# Patient Record
Sex: Male | Born: 1960 | Race: White | Hispanic: No | Smoking: Current every day smoker
Health system: Southern US, Community
[De-identification: ages and names within clinical notes are randomized; demographics above are authoritative.]

## PROBLEM LIST (undated history)

## (undated) ENCOUNTER — Emergency Department (HOSPITAL_COMMUNITY): Admission: EM | Discharge status: Against Medical Advice | Payer: Self-pay

## (undated) DIAGNOSIS — F1910 Other psychoactive substance abuse, uncomplicated: Secondary | ICD-10-CM

## (undated) DIAGNOSIS — I2510 Atherosclerotic heart disease of native coronary artery without angina pectoris: Secondary | ICD-10-CM

## (undated) DIAGNOSIS — R0602 Shortness of breath: Secondary | ICD-10-CM

## (undated) DIAGNOSIS — E7800 Pure hypercholesterolemia, unspecified: Secondary | ICD-10-CM

## (undated) DIAGNOSIS — Z720 Tobacco use: Secondary | ICD-10-CM

## (undated) DIAGNOSIS — K631 Perforation of intestine (nontraumatic): Principal | ICD-10-CM

## (undated) DIAGNOSIS — I10 Essential (primary) hypertension: Secondary | ICD-10-CM

## (undated) DIAGNOSIS — Z86010 Personal history of colonic polyps: Secondary | ICD-10-CM

## (undated) DIAGNOSIS — G4730 Sleep apnea, unspecified: Secondary | ICD-10-CM

## (undated) HISTORY — PX: COLON SURGERY: SHX602

## (undated) HISTORY — DX: Essential (primary) hypertension: I10

## (undated) HISTORY — DX: Tobacco use: Z72.0

## (undated) HISTORY — DX: Pure hypercholesterolemia, unspecified: E78.00

## (undated) HISTORY — DX: Other psychoactive substance abuse, uncomplicated: F19.10

## (undated) HISTORY — PX: OTHER SURGICAL HISTORY: SHX169

## (undated) HISTORY — DX: Atherosclerotic heart disease of native coronary artery without angina pectoris: I25.10

## (undated) HISTORY — DX: Perforation of intestine (nontraumatic): K63.1

## (undated) HISTORY — DX: Personal history of colonic polyps: Z86.010

---

## 1993-07-02 DIAGNOSIS — G4730 Sleep apnea, unspecified: Secondary | ICD-10-CM

## 1993-07-02 HISTORY — DX: Sleep apnea, unspecified: G47.30

## 2008-07-09 ENCOUNTER — Emergency Department (HOSPITAL_COMMUNITY): Admission: EM | Admit: 2008-07-09 | Discharge: 2008-07-09 | Payer: Self-pay | Admitting: Family Medicine

## 2008-07-30 ENCOUNTER — Emergency Department (HOSPITAL_COMMUNITY): Admission: EM | Admit: 2008-07-30 | Discharge: 2008-07-30 | Payer: Self-pay | Admitting: Family Medicine

## 2008-08-06 ENCOUNTER — Emergency Department (HOSPITAL_COMMUNITY): Admission: EM | Admit: 2008-08-06 | Discharge: 2008-08-06 | Payer: Self-pay | Admitting: Emergency Medicine

## 2009-01-12 ENCOUNTER — Ambulatory Visit: Payer: Self-pay | Admitting: Internal Medicine

## 2009-02-14 ENCOUNTER — Ambulatory Visit: Payer: Self-pay | Admitting: Family Medicine

## 2009-02-22 ENCOUNTER — Ambulatory Visit: Payer: Self-pay | Admitting: *Deleted

## 2009-02-24 ENCOUNTER — Ambulatory Visit (HOSPITAL_COMMUNITY): Admission: RE | Admit: 2009-02-24 | Discharge: 2009-02-24 | Payer: Self-pay | Admitting: Internal Medicine

## 2009-07-02 DIAGNOSIS — I2510 Atherosclerotic heart disease of native coronary artery without angina pectoris: Secondary | ICD-10-CM

## 2009-07-02 HISTORY — DX: Atherosclerotic heart disease of native coronary artery without angina pectoris: I25.10

## 2009-07-28 ENCOUNTER — Ambulatory Visit: Payer: Self-pay | Admitting: Family Medicine

## 2009-09-10 HISTORY — PX: CARDIAC CATHETERIZATION: SHX172

## 2009-09-12 ENCOUNTER — Ambulatory Visit (HOSPITAL_COMMUNITY): Admission: RE | Admit: 2009-09-12 | Discharge: 2009-09-12 | Payer: Self-pay | Admitting: Cardiology

## 2010-01-09 IMAGING — RF DG ESOPHAGUS
7 of 11 series · 12 of 24 positions shown · non-contrast
Comparison: None

CLINICAL DATA: History given of solid and liquid dysphagia

ESOPHOGRAM / BARIUM SWALLOW / BARIUM TABLET STUDY
TECHNIQUE: Combined double contrast and single contrast
examination performed using effervescent crystals, thick barium
liquid, and thin barium liquid.  The patient was observed with
fluoroscopy swallowing a 13mm barium sulphate tablet.
Fluoroscopy time:  0.9 minutes.

[Series 1: run · 3 of 9 slices shown (1 of 7)]
[im 2/9]
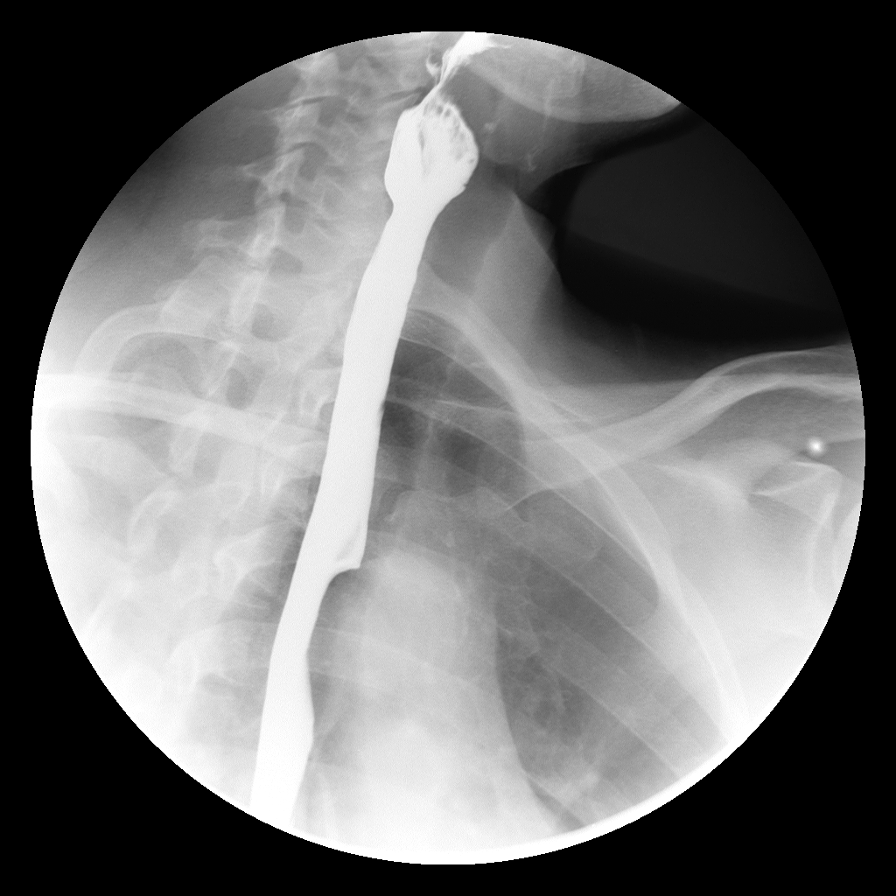
[im 5/9]
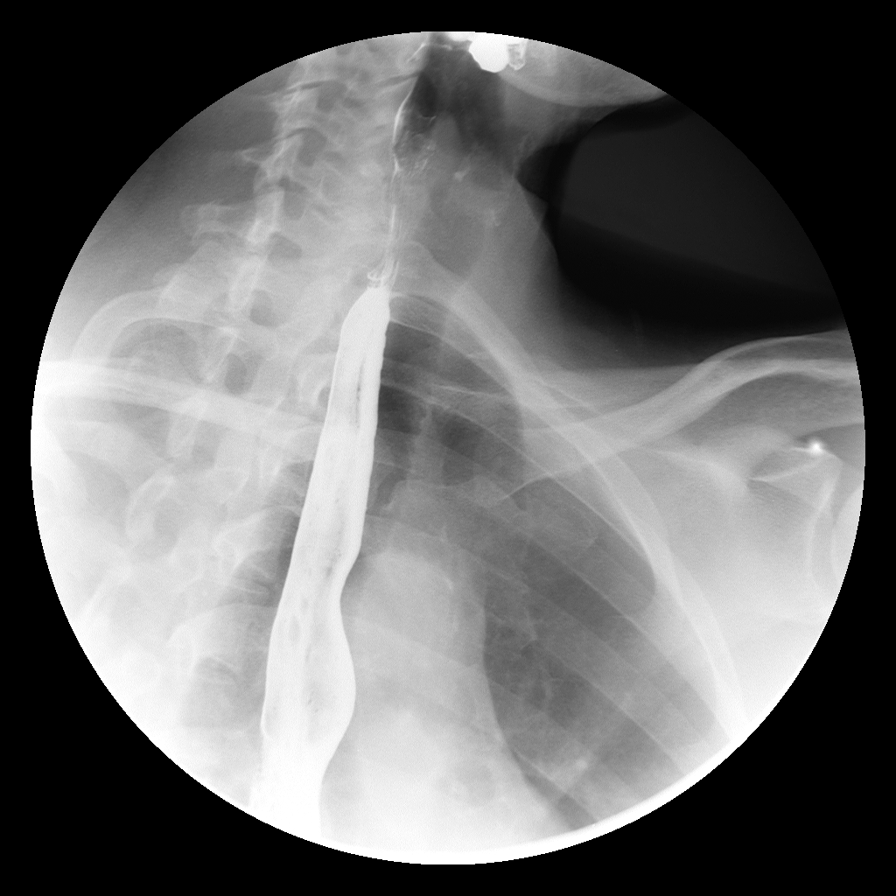
[im 7/9]
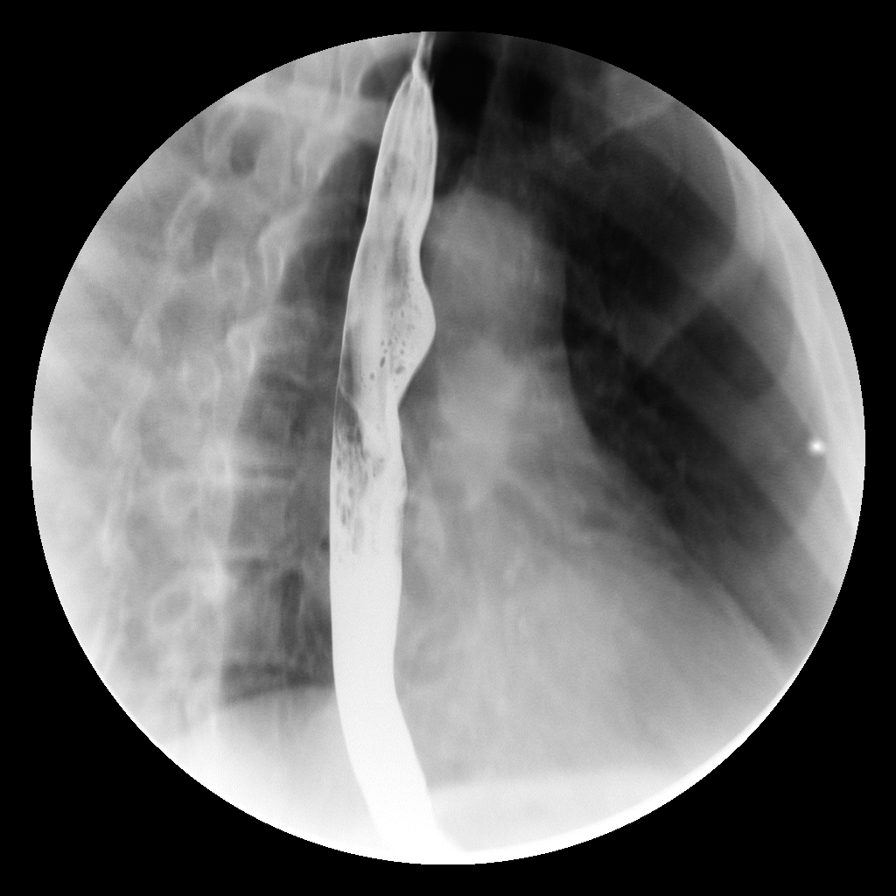

[Series 2: run · 2 of 4 slices shown (2 of 7)]
[im 1/4]
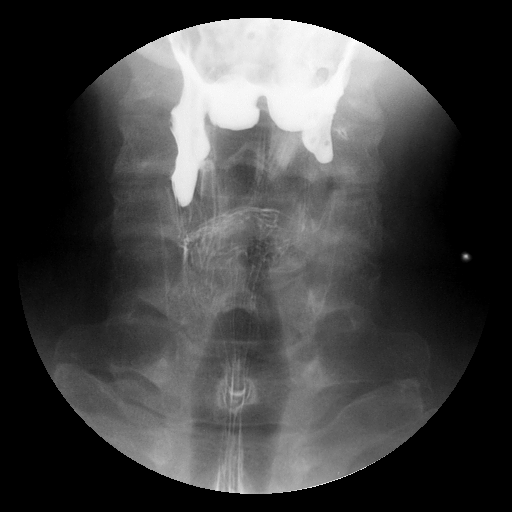
[im 3/4]
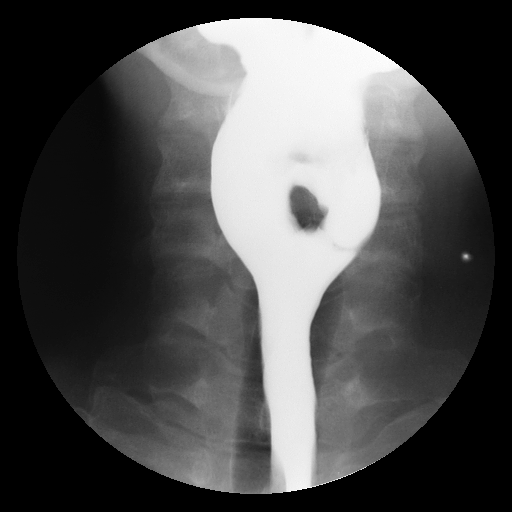

[Series 3: run · 3 of 5 slices shown (3 of 7)]
[im 1/5]
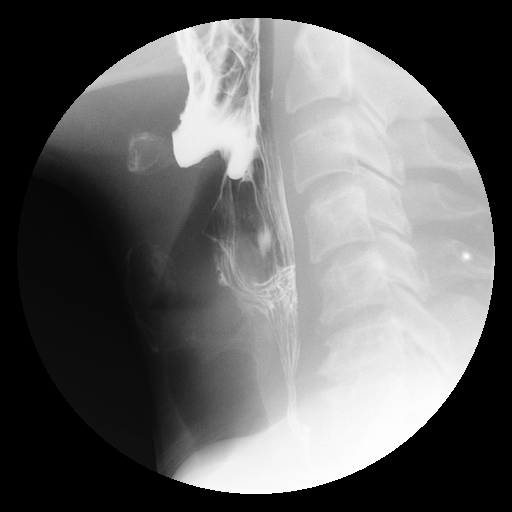
[im 3/5]
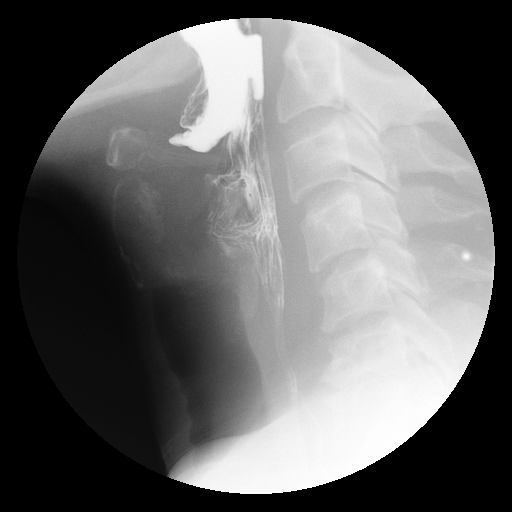
[im 5/5]
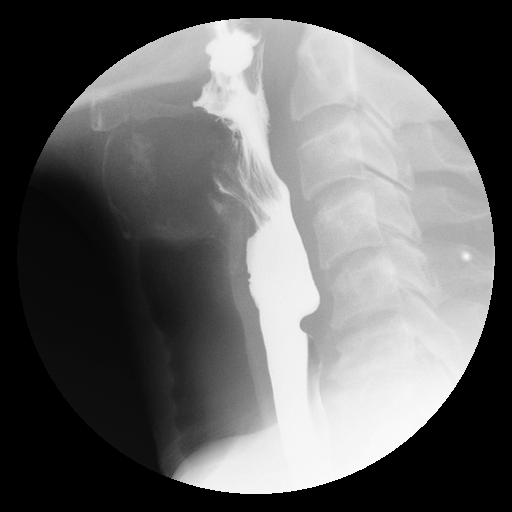

[Series 5: run · 1 of 1 slices shown (4 of 7)]
[im 1/1]
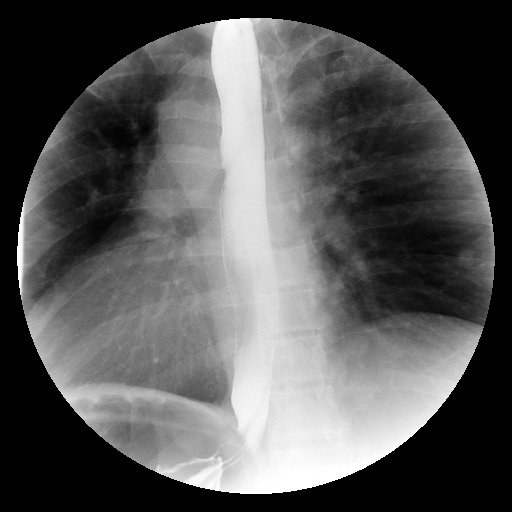

[Series 7: run · 1 of 1 slices shown (5 of 7)]
[im 1/1]
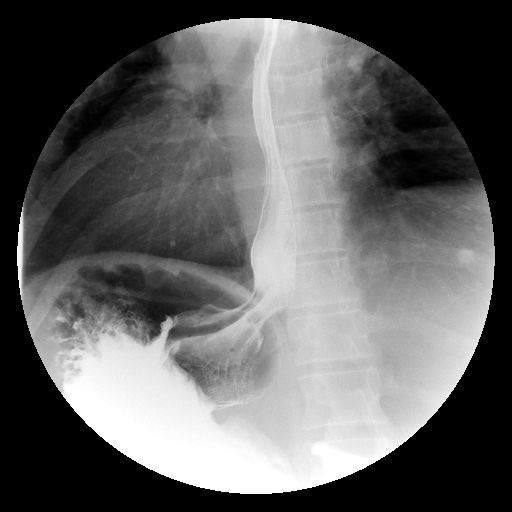

[Series 9: run · 1 of 1 slices shown (6 of 7)]
[im 1/1]
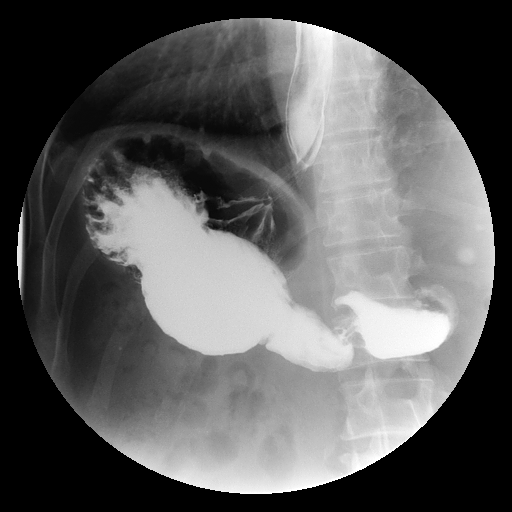

[Series 11: run · 1 of 1 slices shown (7 of 7)]
[im 1/1]
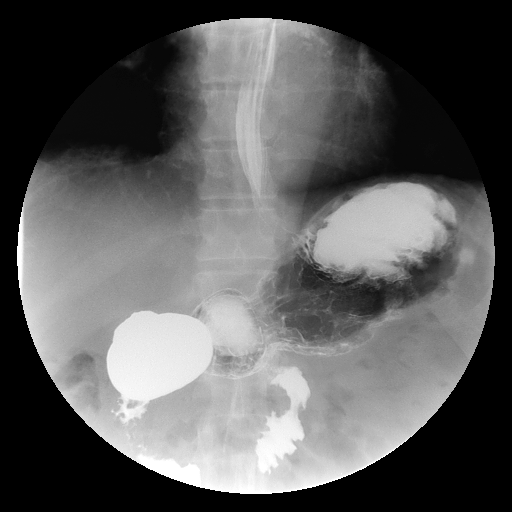

[12 of 24 positions shown; findings below may reference images not displayed]

FINDINGS: The the patient had no difficulty initiating swallowing.
No aspiration occurred.  Hypopharynx appeared normal.

There is a mildly prominent cricopharyngeus muscle impression.  No
Wafeight diverticulum is evident.

No esophageal mass, obstruction, hiatal hernia, or mucosal changes
of esophagitis were seen.  Peristalsis appeared normal.

  In the recumbent position a small amount of spontaneous
gastroesophageal reflux occurred to the level of the aortic arch
with clearing by subsequent peristalsis.

When the patient swallowed the 13 mm barium sulfate tablet, it
passed through the esophagus into the stomach with no evidence of
stricture or narrowing.
IMPRESSION: With swallowing there is a prominent cricopharyngeus muscle
impression.  This can give the sensation of a lump in throat or
dysphagia in some patients.  No Wafeight diverticulum is evident.

There was gastroesophageal reflux seen in the recumbent position.
No stricture or mucosal changes of esophagitis were evident.  No
solid mass or obstruction was evident.

Barium tablet study is normal.

## 2010-02-15 ENCOUNTER — Emergency Department (HOSPITAL_COMMUNITY): Admission: EM | Admit: 2010-02-15 | Discharge: 2010-02-15 | Payer: Self-pay | Admitting: Emergency Medicine

## 2010-04-01 HISTORY — PX: FOREIGN BODY REMOVAL RECTAL: SHX5275

## 2010-04-17 ENCOUNTER — Observation Stay (HOSPITAL_COMMUNITY): Admission: EM | Admit: 2010-04-17 | Discharge: 2010-04-18 | Payer: Self-pay | Admitting: Emergency Medicine

## 2010-04-17 ENCOUNTER — Emergency Department (HOSPITAL_COMMUNITY): Admission: EM | Admit: 2010-04-17 | Discharge: 2010-04-17 | Payer: Self-pay | Admitting: Family Medicine

## 2010-04-18 ENCOUNTER — Encounter (INDEPENDENT_AMBULATORY_CARE_PROVIDER_SITE_OTHER): Payer: Self-pay | Admitting: General Surgery

## 2010-07-24 ENCOUNTER — Encounter: Payer: Self-pay | Admitting: Family Medicine

## 2010-09-13 LAB — BASIC METABOLIC PANEL
BUN: 11 mg/dL (ref 6–23)
Chloride: 106 mEq/L (ref 96–112)
Chloride: 108 mEq/L (ref 96–112)
GFR calc Af Amer: >60 mL/min (ref >60)
GFR calc non Af Amer: >60 mL/min (ref >60)
GFR calc non Af Amer: >60 mL/min (ref >60)
Glucose, Bld: 108 mg/dL — ABNORMAL HIGH (ref 70–99)
Potassium: 3.9 mEq/L (ref 3.5–5.1)
Potassium: 4.4 mEq/L (ref 3.5–5.1)
Sodium: 142 mEq/L (ref 135–145)

## 2010-09-13 LAB — CBC
HCT: 38.7 % — ABNORMAL LOW (ref 39.0–52.0)
HCT: 40.6 % (ref 39.0–52.0)
Hemoglobin: 12.7 g/dL — ABNORMAL LOW (ref 13.0–17.0)
MCV: 87.7 fL (ref 78.0–100.0)
MCV: 88 fL (ref 78.0–100.0)
Platelets: 233 10*3/uL (ref 150–400)
RBC: 4.63 MIL/uL (ref 4.22–5.81)
RDW: 14.4 % (ref 11.5–15.5)
RDW: 14.5 % (ref 11.5–15.5)
WBC: 6.3 10*3/uL (ref 4.0–10.5)
WBC: 6.5 10*3/uL (ref 4.0–10.5)

## 2010-10-16 LAB — POCT I-STAT, CHEM 8
BUN: 18 mg/dL (ref 6–23)
Chloride: 101 mEq/L (ref 96–112)
Creatinine, Ser: 1 mg/dL (ref 0.4–1.5)
Potassium: 4.7 mEq/L (ref 3.5–5.1)
Sodium: 139 mEq/L (ref 135–145)

## 2010-10-16 LAB — FUNGUS CULTURE W SMEAR

## 2010-10-16 LAB — STREP A DNA PROBE: Group A Strep Probe: NEGATIVE

## 2010-10-16 LAB — POCT RAPID STREP A (OFFICE): Streptococcus, Group A Screen (Direct): NEGATIVE

## 2010-10-16 LAB — RPR: RPR Ser Ql: NONREACTIVE

## 2010-12-14 ENCOUNTER — Other Ambulatory Visit (INDEPENDENT_AMBULATORY_CARE_PROVIDER_SITE_OTHER): Payer: Self-pay | Admitting: Surgery

## 2010-12-14 ENCOUNTER — Inpatient Hospital Stay (HOSPITAL_COMMUNITY)
Admission: EM | Admit: 2010-12-14 | Discharge: 2010-12-20 | DRG: 329 | Disposition: A | Discharge status: Home / Self Care | Payer: Self-pay | Attending: Surgery | Admitting: Surgery

## 2010-12-14 ENCOUNTER — Emergency Department (HOSPITAL_COMMUNITY): Payer: Self-pay

## 2010-12-14 DIAGNOSIS — I10 Essential (primary) hypertension: Secondary | ICD-10-CM | POA: Diagnosis present

## 2010-12-14 DIAGNOSIS — Z880 Allergy status to penicillin: Secondary | ICD-10-CM

## 2010-12-14 DIAGNOSIS — K668 Other specified disorders of peritoneum: Secondary | ICD-10-CM | POA: Diagnosis present

## 2010-12-14 DIAGNOSIS — Q430 Meckel's diverticulum (displaced) (hypertrophic): Secondary | ICD-10-CM

## 2010-12-14 DIAGNOSIS — I2510 Atherosclerotic heart disease of native coronary artery without angina pectoris: Secondary | ICD-10-CM | POA: Diagnosis present

## 2010-12-14 DIAGNOSIS — K659 Peritonitis, unspecified: Secondary | ICD-10-CM | POA: Diagnosis present

## 2010-12-14 DIAGNOSIS — X58XXXA Exposure to other specified factors, initial encounter: Secondary | ICD-10-CM | POA: Diagnosis present

## 2010-12-14 DIAGNOSIS — S3660XA Unspecified injury of rectum, initial encounter: Principal | ICD-10-CM | POA: Diagnosis present

## 2010-12-14 LAB — URINE MICROSCOPIC-ADD ON

## 2010-12-14 LAB — COMPREHENSIVE METABOLIC PANEL
ALT: 25 U/L (ref 0–53)
Alkaline Phosphatase: 60 U/L (ref 39–117)
BUN: 33 mg/dL — ABNORMAL HIGH (ref 6–23)
CO2: 23 mEq/L (ref 19–32)
Calcium: 9.1 mg/dL (ref 8.4–10.5)
GFR calc Af Amer: >60 mL/min (ref >60)
GFR calc non Af Amer: >60 mL/min (ref >60)
Glucose, Bld: 118 mg/dL — ABNORMAL HIGH (ref 70–99)
Sodium: 139 mEq/L (ref 135–145)

## 2010-12-14 LAB — DIFFERENTIAL
Basophils Absolute: 0 10*3/uL (ref 0.0–0.1)
Eosinophils Relative: 0 % (ref 0–5)
Lymphocytes Relative: 15 % (ref 12–46)
Lymphs Abs: 0.6 10*3/uL — ABNORMAL LOW (ref 0.7–4.0)
Monocytes Absolute: 0.3 10*3/uL (ref 0.1–1.0)
Monocytes Relative: 7 % (ref 3–12)
Neutro Abs: 3.3 10*3/uL (ref 1.7–7.7)

## 2010-12-14 LAB — CBC
HCT: 41.3 % (ref 39.0–52.0)
Hemoglobin: 14.1 g/dL (ref 13.0–17.0)
MCHC: 34.1 g/dL (ref 30.0–36.0)
MCV: 84.5 fL (ref 78.0–100.0)

## 2010-12-14 LAB — URINALYSIS, ROUTINE W REFLEX MICROSCOPIC
Bilirubin Urine: NEGATIVE
Protein, ur: NEGATIVE mg/dL
Urobilinogen, UA: 1 mg/dL (ref 0.0–1.0)

## 2010-12-14 LAB — ABO/RH: ABO/RH(D): A NEG

## 2010-12-14 LAB — PROTIME-INR: Prothrombin Time: 13.7 seconds (ref 11.6–15.2)

## 2010-12-14 LAB — LIPASE, BLOOD: Lipase: 25 U/L (ref 11–59)

## 2010-12-15 ENCOUNTER — Inpatient Hospital Stay (HOSPITAL_COMMUNITY): Payer: Self-pay

## 2010-12-15 LAB — BASIC METABOLIC PANEL
BUN: 22 mg/dL (ref 6–23)
CO2: 27 mEq/L (ref 19–32)
Chloride: 108 mEq/L (ref 96–112)
Creatinine, Ser: 0.73 mg/dL (ref 0.50–1.35)
GFR calc Af Amer: >60 mL/min (ref >60)
Potassium: 3.9 mEq/L (ref 3.5–5.1)

## 2010-12-15 LAB — CBC
HCT: 34.5 % — ABNORMAL LOW (ref 39.0–52.0)
Hemoglobin: 11.4 g/dL — ABNORMAL LOW (ref 13.0–17.0)
MCV: 85 fL (ref 78.0–100.0)
RBC: 4.06 MIL/uL — ABNORMAL LOW (ref 4.22–5.81)
WBC: 8.8 10*3/uL (ref 4.0–10.5)

## 2010-12-16 LAB — CBC
HCT: 34.7 % — ABNORMAL LOW (ref 39.0–52.0)
Hemoglobin: 11.5 g/dL — ABNORMAL LOW (ref 13.0–17.0)
MCHC: 33.1 g/dL (ref 30.0–36.0)
MCV: 85.7 fL (ref 78.0–100.0)
RDW: 15.3 % (ref 11.5–15.5)

## 2010-12-16 LAB — BASIC METABOLIC PANEL
BUN: 22 mg/dL (ref 6–23)
Chloride: 106 mEq/L (ref 96–112)
Creatinine, Ser: 0.82 mg/dL (ref 0.50–1.35)
GFR calc Af Amer: >60 mL/min (ref >60)
GFR calc non Af Amer: >60 mL/min (ref >60)
Glucose, Bld: 95 mg/dL (ref 70–99)
Potassium: 3.9 mEq/L (ref 3.5–5.1)

## 2010-12-17 LAB — PHOSPHORUS: Phosphorus: 1.4 mg/dL — ABNORMAL LOW (ref 2.3–4.6)

## 2010-12-17 LAB — BASIC METABOLIC PANEL
BUN: 18 mg/dL (ref 6–23)
CO2: 25 mEq/L (ref 19–32)
Glucose, Bld: 98 mg/dL (ref 70–99)
Potassium: 3.5 mEq/L (ref 3.5–5.1)
Sodium: 139 mEq/L (ref 135–145)

## 2010-12-18 LAB — PHOSPHORUS: Phosphorus: 2.8 mg/dL (ref 2.3–4.6)

## 2010-12-18 LAB — BASIC METABOLIC PANEL
CO2: 25 mEq/L (ref 19–32)
Chloride: 105 mEq/L (ref 96–112)
Glucose, Bld: 93 mg/dL (ref 70–99)
Sodium: 138 mEq/L (ref 135–145)

## 2010-12-18 LAB — MAGNESIUM: Magnesium: 1.9 mg/dL (ref 1.5–2.5)

## 2010-12-21 NOTE — Discharge Summary (Signed)
NAMECLAYBORNE, DIVIS                 ACCOUNT NO.:  [id number]  MEDICAL RECORD NO.:  [id number]  LOCATION:  5151                         FACILITY:  [facility]  PHYSICIAN:  Velora Heckler, MD      DATE OF BIRTH:  11-12-1960  DATE OF ADMISSION:  12/14/2010 DATE OF DISCHARGE:  12/20/2010                              DISCHARGE SUMMARY   DISCHARGING PHYSICIAN:  Velora Heckler, MD  CONSULTANTS:  None.  PROCEDURES:  Exploratory laparoscopy with low anterior resection and diverting loop ileostomy and placement of an On-Q pain pump by Dr. Michaell Cowing on December 14, 2010.  REASON FOR ADMISSION:  Mr. Trusty is a 50 year old male who was in the shower around 3 a.m. prior to presenting to the emergency department. He states he developed a sudden onset of abdominal pain that was initially generalized and periumbilical, but has migrated predominantly on the right side of his abdomen.  He denies any nausea or vomiting and has had no diarrhea.  He states that the pain has persisted and gotten worse.  He came to the emergency department this morning for further evaluation.  Upon arrival to the emergency department, he had abdominal films which showed evidence of pneumoperitoneum.  At this time, we were asked to evaluate the patient for surgical admission.  Please see admitting history and physical for further details.  ADMITTING DIAGNOSES: 1. Pneumoperitoneum of unknown origin. 2. Hypertension.  HOSPITAL COURSE:  At this time, the patient was admitted.  He was taken to the operating room where he was found to have a perforated rectum. At this time, he underwent a low anterior resection with a diverting loop ileostomy and placement of an On-Q pain pump.  The patient tolerated the procedure well.  He did have an NG tube postoperatively, however, would not flush on postoperative day #1, and therefore it was clamped.  On postoperative day #2, the patient had very minimal NG tube residual and this was discontinued.   Over the next several days, the patient did not have any further nausea and vomiting with his NG tube clamped and his diet was slowly advanced as tolerated.  He then began having liquid output from his loop ileostomy.  By postoperative day #6, the patient was tolerating a regular diet and having normal liquid output from his loop ileostomy.  His abdomen was otherwise soft and only mildly tender.  His incisions were clean, dry, and intact.  His On-Q pain pump was discontinued prior to discharge as it was empty.  The patient has been evaluated by the wound ostomy care nurse in the hospital and he has been set up for an indigent program to help supply him with his ostomy needs.  Home Health nursing has also been set up to help him with ostomy care while at his [facility]. At this time, the patient is otherwise stable for discharge home.  DISCHARGE DIAGNOSES: 1. Perforated rectum status post diagnostic laparoscopy with a low     anterior resection and loop ileostomy. 2. Hypertension.  DISCHARGE MEDICATIONS:  Please see medication reconciliation form.  DISCHARGE INSTRUCTIONS:  The patient may increase his activity slowly and walk up steps.  He may shower, however, he is not to bathe for at least the next 2 weeks.  He may not drive for the next 1-2 weeks.  He is not to do any heavy lifting for the next 4-6 weeks greater than 15 or 20 pounds.  He has no dietary restrictions.  He is to return to see Dr. Michaell Cowing in our office in 2 weeks.  He is to call for fever greater than 101.5 or worsening abdominal pain.     Letha Cape, PA   ______________________________ Velora Heckler, MD    KEO/MEDQ  D:  12/20/2010  T:  12/20/2010  Job:  [id number]  cc:   Ardeth Sportsman, MD  Electronically Signed by Barnetta Chapel PA on 12/21/2010 01:25:31 PM Electronically Signed by Darnell Level MD on 12/21/2010 03:30:03 PM

## 2010-12-28 NOTE — H&P (Signed)
NAMECHEVY, SWEIGERT NO.:  [id number]  MEDICAL RECORD NO.:  [id number]  LOCATION:  5151                         FACILITY:  [facility]  PHYSICIAN:  Ardeth Sportsman, MD     DATE OF BIRTH:  01/05/1961  DATE OF ADMISSION:  12/14/2010 DATE OF DISCHARGE:                             HISTORY & PHYSICAL   PRIMARY CARE PHYSICIAN:  Unassigned.  CHIEF COMPLAINT:  Abdominal pain.  HISTORY OF PRESENT ILLNESS:  Scott Berry is a 50 year old gentleman who was in the shower last evening about 3 a.m. or early this morning when he developed sudden onset of abdominal pain.  This was sorted generalized in the periumbilical region, but has since migrated predominantly on the right side of his abdomen.  He has had no nausea, vomiting associated. He has had no diarrhea.  The patient states that he has generally constipated and did have a bowel movement yesterday that was normal appearing.  He otherwise denies hematemesis, melena, or hematochezia. He states that the pain has persisted and gotten actually worse, therefore he was brought to the emergency department this morning.  He denies any associated chest pain, shortness of breath, or wheezing. Denies any dysuria, hematuria.  Denies any trauma to his abdomen.  He was previously admitted and had to have a foreign body removed from his rectum several months ago.  The patient denies any recent ingestions or insertions of foreign bodies.  Upon evaluation in the emergency department, some plain films now show evidence of pneumoperitoneum on to the right diaphragm.  CT scan has been ordered, however, Surgical consult is requested.  PAST MEDICAL HISTORY:  Hypertension/coronary artery disease.  SURGICAL HISTORY:  Removal of rectal foreign body in October 2011.  FAMILY HISTORY:  Noncontributory at present case.  SOCIAL HISTORY:  The patient lives in a rehab facility at [facility]. 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  He is otherwise not married.  MEDICATIONS:  Include isosorbide, dose unknown.  ALLERGIES:  Include PENICILLIN which the patient states causes shortness of breath.  REVIEW OF SYSTEMS:  Please see history of present illness for pertinent findings, otherwise complete 12 system review negative.  PHYSICAL EXAMINATION:  GENERAL:  Reveals a 50 year old thin Caucasian gentleman, who is in moderate-to-severe distress, but nontoxic appearing. VITAL SIGNS:  Temperature of 97.8, heart rate currently of 88, blood pressure 101/56, respiratory rate of 20, oxygen saturation 96% on room air. HEENT:  Unremarkable. NECK:  Supple without lymphadenopathy.  Trachea is midline.  No thyromegaly or masses. LUNGS:  Clear to auscultation.  No wheezes, rhonchi, or rales.  Normal respiratory effort without use of accessory muscles. HEART:  Regular rate and rhythm.  No murmurs, gallops, or rubs. Carotids are 2+ and brisk without bruits.  Peripheral pulses intact and symmetrical. ABDOMEN:  Rigid and generally tender, predominately in the right side greater than the left.  Positive evidence of peritonitis.  No mass effect is appreciated.  Bowel sounds are hypoactive. RECTAL:  Deferred at the present time. GENITOURINARY:  Within normal limits.  Testes are bilaterally descended. No external lesions are seen. EXTREMITIES:  Good  active range of motion in all extremities without crepitus or pain.  Normal muscle strength and tone without atrophy. SKIN:  Otherwise warm and dry with good turgor.  No rashes, lesions, or nodules. NEUROLOGIC:  The patient is alert and oriented x3.  No obvious deficits noted.  DIAGNOSTICS:  CBC shows a white blood cell count of 4.2, hemoglobin of 14.1, hematocrit of 41.3, platelet count 218.  Metabolic panel shows sodium of 139, potassium of 4.1, chloride of 105, CO2 of 23, BUN of 33, creatinine of 1.2, glucose of 118.  IMAGING:  Plain films of  the abdomen show pneumoperitoneum, localizing in the right hemidiaphragm, dilated loops of small bowel, consistent with at least partial bowel obstruction versus ileus.  IMPRESSION: 1. Pneumoperitoneum likely secondary to perforated peptic ulcer given     the patient's history of NSAID and aspirin use versus     diverticulitis versus other. 2. Hypertension - stable.  PLAN:  We will admit the patient.  I had discussed with him the need for urgent surgery to find and repair whenever has caused a pneumoperitoneum.  The patient understands and will agree to said procedure and admission.     Brayton El, PA-C   ______________________________ Ardeth Sportsman, MD    KB/MEDQ  D:  12/14/2010  T:  12/15/2010  Job:  [id number]  Electronically Signed by Brayton El  on 12/25/2010 02:45:12 PM Electronically Signed by Karie Soda MD on 12/28/2010 07:16:27 AM

## 2010-12-28 NOTE — Op Note (Signed)
NAMEDEWARREN, LEDBETTER NO.:  [id number]  MEDICAL RECORD NO.:  [id number]  LOCATION:  5151                         FACILITY:  [facility]  PHYSICIAN:  Ardeth Sportsman, MD     DATE OF BIRTH:  [birth year]  DATE OF PROCEDURE:  12/14/2010 DATE OF DISCHARGE:                              OPERATIVE REPORT   PRIMARY CARE PHYSICIAN:  Unknown.  SURGEON:  Ardeth Sportsman, MD  ASSISTANT:  Gabrielle Dare. Janee Morn, MD and Brayton El, PA-C.  PREOPERATIVE DIAGNOSIS:  Pneumoperitoneum with acute abdomen.  POSTOPERATIVE DIAGNOSES: 1. Pneumoperitoneum with acute abdomen. 2. Anterior rectal wall perforation most likely question traumatic. 3. He has a Meckel's diverticulum (short, soft).  ANESTHESIA: 1. General anesthesia. 2. Local anesthetic and a field block on all port sites. 3. On-Q continuous bupivacaine pain pump.  PROCEDURE PERFORMED: 1. Diagnostic laparoscopy. 2. Laparoscopically-assisted low anterior resection with staple     anastomosis and proximal diverting loop ileostomy. 3. Rigid proctoscopy.  SPECIMENS:  Rectosigmoid, open end is proximal, anastomotic rings, blue stitches proximal.  DRAINS:  None.  ESTIMATED BLOOD LOSS:  100 mL.  OPERATIVE FINDINGS:  The proximal limb of the loop ileostomy is cephalad and the distal limb is inferior.  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  He had sudden pain in the shower at 3 o'clock this morning.  He does have a history of placing foreign bodies into his rectum and required operative removal in November 2011.  He denied such behavior at this time.  He came in with severe pain with acute abdomen.  X-ray films showed free air.  Based on his rigid abdomen and tenderness and free air, I recommended a bowel exploration.  He is not severely septic but uncomfortable.  He is not in severe shock.  Differential diagnosis discussed.  Options discussed.  Recommendations were made to  start with diagnostic laparoscopy with possible laparotomy.  Probable repair of perforated ulcer versus colon such as diverticulitis were discussed. Possibility of ostomy were discussed.  Risks, benefits, and alternatives discussed.  Questions answered and he agreed to proceed.  OPERATIVE FINDINGS:  He had some mild early peritonitis primarily infraumbilically and down into the pelvis.  He had a perforation in the anterior proximal rectum.  It was a vertical slit.  There were some ecchymosis associated with it.  There was no evidence of any ischemia. There was no evidence of any diverticulitis.  I cannot notice any obvious mucosal aberrations.  It is suspicious for a transient or rectal trauma.  He has a small Meckel's diverticulum that is rather flat and shallow in his proximal ileum.  His appendix was normal.  His small bowel was otherwise normal.  His colon was constipated with stool to the proximal descending colon but his rectosigmoid was clear without any stool within it arguing against a stomal ulcer.  There was no evidence of any feculent contamination.  His gallbladder was mildly thickened and inflamed consistent with secondary peritonitis of the liver and stomach, otherwise looked normal.  DESCRIPTION OF PROCEDURE PERFORMED:  Informed consent was confirmed. The patient received IV Invanz given his PENICILLIN allergy.  He had received Toradol in the ER.  His support group frankly refused him to get narcotics until he was going to the OR and then they acquiesced.  He underwent anesthesia without any difficulty.  He was positioned supine with arms tucked.  His abdomen was clipped, prepped, and draped in sterile fashion.  Surgical time-out confirmed our plan.  He had a Foley catheter sterilely placed.  I placed a #5-mm port in the left upper quadrant using optical entry technique with the patient in steep reverse Trendelenburg and left side up.  The entry was clean.  Camera  inspection revealed evidence of some mild peritoneal irritation infraumbilically.  I placed 5-mm ports at the umbilicus and then the left mid abdomen.  I turned attention to the duodenal sweep given his recent history of aspirin, NSAIDs, and denial of using any rectal probing.  I could see the stomach looked normal and the duodenal bulb looked normal as well. There was no phlegmon or abscess or abnormalities.  His gallbladder was mildly thickened but no evidence of any strong cholecystitis.  His liver otherwise looked normal.  His duodenal sweep felt soft and normal.  He had no evidence of inflammation in the mesentery or transverse colon.  I turned attention down towards the left lower quadrant and his sigmoid colon looked soft and normal and not particularly inflamed.  I did pull up to the rectum and I did not see any obvious feculent contamination. There was a little murky fluid down in the pelvis but no strong evidence of any diverticulitis.  I proceeded to run the small bowel.  I could find the cecum and a normal- appearing appendix.  I found the terminal ileum.  I ran the small bowel. In the proximal ileum, I found a mild antimesenteric outpouching about 1 x 2 cm consistent with probably a small Meckel's diverticulum.  He had a small dot of fat on it about 5 mm in size.  It felt soft and not inflamed or irritated.  I ran the small bowel and the rest of small bowel and the ligament of Treitz seemed normal.  Cecum all the way to the hepatic flexure, transverse colon, splenic flexure, and descending colon was normal except for some stool in the mid transverse colon to proximal descending colon.  Again the stomach looked normal.  I turned attention down in the pelvis and the area seemed to be the area of most inflammation.  Followed the rectosigmoid and eviscerated the rectosigmoid and when I got down to the rectum near the peritoneal reflection, I saw an anterior midline bruising.   In exploring that, there was an obvious slit perforation.  The rectum was clear and clean and there was one tiny stool ball down in the pelvic cul-de-sac but otherwise no major feculent contamination.  I called over Dr. Janee Morn and he looked at it and agreed.  This seems suspicious with rectal trauma.  Therefore, we decided to proceed with resection.  He did have some inflammation of his mesentery and the mucosa was somewhat heaped up around there but I could not see a definite polyp or mucosal ulcer.  Based on that, I felt I should do a segmental resection.  I mobilized the rectosigmoid by first in lateral medial fashion mobilizing from the mid descending colon down to help free the sigmoid off the gonadal vessels.  I could see the left ureter.  It was somewhat slightly larger than average but had normal peristalsis, otherwise looked healthy.  I followed back down to the peritoneal sweep on the left side.  I came down and found the sigmoid mesentery and elevated that.  I scored that from ligament of Treitz down to the peritoneal collection on the right side.  I was able to get into the retromesenteric plane and meet up with the start of my lateral mobilization.  I further elevated up. He had a somewhat enlarged parasympathetic/sympathetic nerves in the classic Y pattern going down over the sacral prominence where I was able to free that off and inflamed mesorectum.  I came into the mesorectum and did mesorectal excision just down to the mid rectum.  With that I came around and we could get to the mid rectum which was distal to the rectal perforation which was in the proximal anterior rectum.  I isolated the inferior mesenteric artery and inferior mesenteric vein near ligament of Treitz and isolated, skeletonized, and then ligated them with clips.  I tried to preserve the left colic artery at its takeoff and do a more, not a super high ligation since my suspicion for cancer was  low.  I placed 2-3 clips on the proximal side and then 1 on the distal side and then transected using Enseal bipolar cautery system. I verified good mobilization.  I further mobilized up to the splenic flexure but I did not have to do a complete formal splenic flexure mobilization.  I placed a GelPort through a 6-cm Pfannenstiel incision as an extraction site.  I kept my fingers for feeling and felt the inflammation of the mesorectum, but otherwise normal.  I skeletonized the mesorectum and transected that off at the mid rectum.  I used a 60-cm clips, laparoscopic Ethicon stapler to transect the rectum.  I transected about 90% of it but I had to do one more firing just to get the other corner as his rectum was rather dilated.  With that I could eviscerate and get his rectosigmoid out and his descending colon well.  I milked the stool balls from the splenic flexure down into the sigmoid to have most of the stool balls out, most of the stool balls not remaining in his colon.  We chose the site that could reach quite well with the good feeding vessel and transected at the descending/sigmoid junction.  It reached down well over the pubic rim.  I took the mesentery radially using the bipolar system.  I transected the rectum at the descending/sigmoid colon and sent the specimen off.  I did dilation and had good healthy mucosal bleeding with healthy mucosa and seromuscular layers.  A 29 dilator was easily felt. I placed an anvil of 33 EEA stapler in the proximal end and closed that using 0 Prolene pursestring.  I returned down to the abdomen.  Small bowel was dominant in the pelvis. I did reexplore it laparoscopically.  There were some old blood in the pelvis that I irrigated and washed out.  There was a small retroperitoneal oozer on the right side near where I had taken the mesentery, isolated, elevated that, and controlled with bipolar cautery well staying away from the ureters and  gonadal vessels.  I did copious irrigation with clear return.  Dr. Janee Morn scrubbed down and did rigid proctoscopy.  The rectal stump was about 13 cm remaining.  He found no other foreign body, no other ulcerations, or other abnormalities.  No tumors or masses.  He dilated up, I was able to pass the stapler up.  He brought  the spike out just a millimeter above the staple line.  I attached the anvil on to the spike of the stapler.  He brought that down, tightened it for a minute, fired it, held fired for 30 seconds, and released.  He had 2 excellent anastomotic rings.  We did air-leak test with rigid proctoscopy with no leak.  The anastomosis was at 12 cm.  It laid well with no tension at the anastomosis.  I did more copious irrigation with good hemostasis.  I reached the floor of the abdomen trying to allow the greater omentum to go down.  I placed On-Q catheters.  I removed the 5-mm ports.  I closed the Pfannenstiel wounds using 0 Vicryl stitch in the posterior rectus fascia/peritoneum vertically.  I closed the anterior rectus fascia transversely using #1 PDS.  I closed the skin using 4-0 Monocryl stitch little bit loosely and placed some Iodoform wicks and allowed the wound to drain.  There was no significant contamination but he did have some peritonitis and there was a perforation, so I did not feel it was reasonable to probably close the wound.  I placed the On-Q catheters through the sheath that I had placed prior to closing the fascia and closed the final port sites.  Of note because this was anastomosis with peritonitis, I decided to do a proximally diverting loop ileostomy.  I chose the site in the right supraumbilical perirectal paramedian region and placed a 3-cm subcutaneous defect and split the anterior rectus fascia transversely and split through the rectus muscles and split the posterior rectus fascia vertically.  I dilated it through 2 fingers.  I chose ileum  about 15-cm proximal to the ileocecal valve and that came and stretched up well.  I placed that up before closing the wounds.  After all the wounds were closed and dressings were closed, I matured the ileostomy using a 3-0 Vicryl interrupted stitches.  The loop ileostomy to the proximal limb is cephalad.  The distal limb is inferior.  My finger easily passed across the fascia arguing against any twisting or turning.  The patient was extubated and taken to recovery room in stable condition.  We will try and minimize his narcotics given the fact that is recovering addict and he is in a strict anti-narcotic recovery group, but I think it is his primary given his peritonitis and recent surgery, to avoid all narcotics at this time, but hopefully the On-Q and IV Toradol, etc., that will help minimize it.     Ardeth Sportsman, MD     SCG/MEDQ  D:  12/14/2010  T:  12/15/2010  Job:  [id number]  Electronically Signed by Karie Soda MD on 12/28/2010 07:16:35 AM

## 2011-01-02 ENCOUNTER — Encounter (INDEPENDENT_AMBULATORY_CARE_PROVIDER_SITE_OTHER): Payer: Self-pay | Admitting: Surgery

## 2011-01-02 NOTE — Progress Notes (Unsigned)
Subjective:     Patient ID: Scott Berry, male   DOB: [birth year], 50 y.o.   MRN: [id number]    There were no vitals taken for this visit.    HPI   Review of Systems  Constitutional: Negative for fever, chills, fatigue and unexpected weight change.  HENT: Negative for nosebleeds, facial swelling, neck pain and neck stiffness.   Eyes: Negative for photophobia, discharge and visual disturbance.  Respiratory: Negative for cough, choking, chest tightness and shortness of breath.   Cardiovascular: Negative for chest pain and palpitations.       Objective:   Physical Exam  Constitutional: He is oriented to person, place, and time. He appears well-developed and well-nourished. No distress.  HENT:  Head: Normocephalic.  Mouth/Throat: Oropharynx is clear and moist. No oropharyngeal exudate.  Eyes: Conjunctivae and EOM are normal. Pupils are equal, round, and reactive to light. No scleral icterus.  Neck: Normal range of motion. Neck supple. No tracheal deviation present.  Cardiovascular: Normal rate, regular rhythm and intact distal pulses.   Pulmonary/Chest: Effort normal and breath sounds normal. No respiratory distress.  Abdominal: Soft. He exhibits no distension. There is no tenderness. Hernia confirmed negative in the right inguinal area and confirmed negative in the left inguinal area.  Musculoskeletal: Normal range of motion. He exhibits no tenderness.  Lymphadenopathy:    He has no cervical adenopathy.       Right: No inguinal adenopathy present.       Left: No inguinal adenopathy present.  Neurological: He is alert and oriented to person, place, and time. No cranial nerve deficit. He exhibits normal muscle tone. Coordination normal.  Skin: Skin is warm and dry. No rash noted. He is not diaphoretic. No erythema. No pallor.  Psychiatric: He has a normal mood and affect. His behavior is normal. Judgment and thought content normal.       Assessment:     ***    Plan:     ***

## 2011-01-24 ENCOUNTER — Encounter (INDEPENDENT_AMBULATORY_CARE_PROVIDER_SITE_OTHER): Payer: Self-pay

## 2011-01-25 ENCOUNTER — Encounter (INDEPENDENT_AMBULATORY_CARE_PROVIDER_SITE_OTHER): Payer: Self-pay | Admitting: General Surgery

## 2011-01-30 ENCOUNTER — Ambulatory Visit (INDEPENDENT_AMBULATORY_CARE_PROVIDER_SITE_OTHER): Payer: PRIVATE HEALTH INSURANCE | Admitting: Surgery

## 2011-01-30 ENCOUNTER — Encounter (INDEPENDENT_AMBULATORY_CARE_PROVIDER_SITE_OTHER): Payer: Self-pay | Admitting: Surgery

## 2011-01-30 VITALS — Temp 96.8°F

## 2011-01-30 DIAGNOSIS — K631 Perforation of intestine (nontraumatic): Secondary | ICD-10-CM

## 2011-01-30 HISTORY — DX: Perforation of intestine (nontraumatic): K63.1

## 2011-01-30 NOTE — Progress Notes (Signed)
Subjective:     Patient ID: Scott Berry, male   DOB: 02/22/1961, 50 y.o.   MRN: [id number]  HPI  Procedure: Emergent lap assisted sigmoid colectomy with diverting ileostomy June 2012 for traumatic perforation at the rectosigmoid junction.  The patient comes today feeling well. He is here with his supporter. He is emptying the ileostomy about every 3 hours. He no longer feels lightheaded or dizzy. His appetite is good.  He notes he gets a little burning irritation around the ileostomy bag. He's getting a to seal better. He had questions about fine tuning the appliance.  He's urinating well. He gets minimal mucus drainage from the anus. He's not had any other traumatic events. Overall he feels well. He's ranged in getting the ileostomy takedown.  Review of Systems  Constitutional: Negative for fever, chills and diaphoresis.  HENT: Negative for sore throat and trouble swallowing.   Eyes: Negative for photophobia and visual disturbance.  Respiratory: Negative for choking and shortness of breath.   Cardiovascular: Negative for chest pain and palpitations.  Gastrointestinal: Negative for nausea, vomiting, abdominal pain, diarrhea, constipation, blood in stool, abdominal distention, anal bleeding and rectal pain.  Genitourinary: Negative for dysuria, urgency, difficulty urinating and testicular pain.  Musculoskeletal: Negative for myalgias, arthralgias and gait problem.  Skin: Negative for color change and rash.  Neurological: Negative for dizziness, speech difficulty, weakness and numbness.  Hematological: Negative for adenopathy.  Psychiatric/Behavioral: Negative for suicidal ideas, hallucinations, confusion, sleep disturbance and agitation. The patient is not nervous/anxious.        Objective:   Physical Exam  Constitutional: He is oriented to person, place, and time. He appears well-developed and well-nourished. No distress.  HENT:  Head: Normocephalic.  Mouth/Throat: Oropharynx is  clear and moist. No oropharyngeal exudate.  Eyes: Conjunctivae and EOM are normal. Pupils are equal, round, and reactive to light. No scleral icterus.  Neck: Normal range of motion. Neck supple. No tracheal deviation present.  Cardiovascular: Normal rate, regular rhythm and intact distal pulses.   Pulmonary/Chest: Effort normal and breath sounds normal. No respiratory distress.  Abdominal: Soft. He exhibits no distension and no mass. There is no tenderness. Hernia confirmed negative in the right inguinal area and confirmed negative in the left inguinal area.       Ileostomy in right lower quadrant. Pink. Thin fluid & gas in the bag.   Incisions well healed. No hernia. No wound or cellulitis.  Musculoskeletal: Normal range of motion. He exhibits no tenderness.  Lymphadenopathy:    He has no cervical adenopathy.       Right: No inguinal adenopathy present.       Left: No inguinal adenopathy present.  Neurological: He is alert and oriented to person, place, and time. No cranial nerve deficit. He exhibits normal muscle tone. Coordination normal.  Skin: Skin is warm and dry. No rash noted. He is not diaphoretic. No erythema. No pallor.  Psychiatric: He has a normal mood and affect. His behavior is normal. Judgment and thought content normal.       Assessment:     6-1/2 weeks status post emergent colectomy/diverting loop ileostomy for rectal perforation. Improving well.    Plan:     I think it is reasonable discuss the ileostomy takedown. I would like to wait at least 2 months after this.  Barium enema to evaluate anastomosis to rule out stricture or leak.  Also r/o any other pathology proximally.  Consider colonoscopy  Avoid any further rectal trauma.  The technique of the ileostomy takedown in the OR was discussed. Risks benefits and alternatives were discussed. However he tolerated the emergency surgery well so I'm hopeful he will tolerate this well. Questions answered and he and his  supporter agree with this plan.

## 2011-01-30 NOTE — Patient Instructions (Signed)
Get enema to evaluate colon anastomosis/surgery  Ileostomy takedown soon

## 2011-01-31 ENCOUNTER — Telehealth (INDEPENDENT_AMBULATORY_CARE_PROVIDER_SITE_OTHER): Payer: Self-pay

## 2011-01-31 ENCOUNTER — Encounter: Payer: Self-pay | Admitting: Gastroenterology

## 2011-01-31 NOTE — Telephone Encounter (Signed)
Called pt to notify him that Dr Michaell Cowing forgot to mention to him about getting a colonoscopy before he has surgery on 02-19-11 to have the takedown ilieostomy. I called [facility] to schedule the nurse preop visit for 02-05-11 @ 2:00 and the colonoscopy with Dr Christella Hartigan for 02-12-11 @9 :30. I notified Meriam Sprague of pt's facility where he lives for now./ [facility]

## 2011-02-02 ENCOUNTER — Ambulatory Visit
Admission: RE | Admit: 2011-02-02 | Discharge: 2011-02-02 | Disposition: A | Discharge status: Home / Self Care | Payer: Self-pay | Source: Ambulatory Visit | Attending: Surgery | Admitting: Surgery

## 2011-02-02 ENCOUNTER — Telehealth (INDEPENDENT_AMBULATORY_CARE_PROVIDER_SITE_OTHER): Payer: Self-pay | Admitting: General Surgery

## 2011-02-02 DIAGNOSIS — K631 Perforation of intestine (nontraumatic): Secondary | ICD-10-CM

## 2011-02-02 NOTE — Telephone Encounter (Signed)
Dr Vear Clock called report on Barium Enema from [facility]. She sent patient home. He has a slight leak at his rectosigmoid anastomosis. Please advise.

## 2011-02-05 ENCOUNTER — Telehealth: Payer: Self-pay | Admitting: *Deleted

## 2011-02-05 ENCOUNTER — Ambulatory Visit (AMBULATORY_SURGERY_CENTER): Payer: Self-pay | Admitting: *Deleted

## 2011-02-05 VITALS — Ht 69.0 in | Wt 136.9 lb

## 2011-02-05 DIAGNOSIS — K631 Perforation of intestine (nontraumatic): Secondary | ICD-10-CM

## 2011-02-05 MED ORDER — PEG-KCL-NACL-NASULF-NA ASC-C 100 G PO SOLR: ORAL | Status: DC

## 2011-02-05 NOTE — Telephone Encounter (Signed)
Dr Christella Hartigan- pt is scheduled for reversal ileostomy on 8/20 with Dr. Michaell Cowing.  He is scheduled for direct colonoscopy at Dr. Michaell Cowing' request before ileostomy takedown. (See OV w/ Dr. Michaell Cowing from 7/31). Pt had barium enema om 8/3 that showed  a slight leak at his rectosigmoid anastomosis.  The patient's colonoscopy is scheduled for Monday 8/13.  Do you want to see this patient in office before colonoscopy?  (You have no openings for office appointments before colon is scheduled.) Please advise  Scott Berry

## 2011-02-06 NOTE — Telephone Encounter (Signed)
Repeat enema study in 3-4 weeks.  No colostomy takedown until leak closed =  Delay surgery

## 2011-02-06 NOTE — Telephone Encounter (Signed)
Repeat enema in 3-4 weeks.  No colonoscopy nor surgery until leak closed = Delay surgery

## 2011-02-06 NOTE — Telephone Encounter (Signed)
Scott Berry,  please cancel the colonoscopy for now.  He has a leak at anastomosis on recent BE and so I'm not sure if he will be getting the surgical reversal as planned.    Brett Canales, Let me know what you think about the rectosigmoid leak on recent BE.  Should he have a bit more time to heal?

## 2011-02-07 NOTE — Progress Notes (Signed)
Dr. Christella Hartigan states that pt's colonoscopy needs to be cancelled until anastamosis site has healed.  Donzetta Kohut, pt's [employer] geologist, call and informed.

## 2011-02-08 ENCOUNTER — Other Ambulatory Visit (HOSPITAL_COMMUNITY): Payer: Self-pay

## 2011-02-12 ENCOUNTER — Other Ambulatory Visit: Payer: Self-pay | Admitting: Gastroenterology

## 2011-02-16 ENCOUNTER — Telehealth (INDEPENDENT_AMBULATORY_CARE_PROVIDER_SITE_OTHER): Payer: Self-pay

## 2011-02-16 NOTE — Telephone Encounter (Signed)
Returned [employer] message that I have not forgot about scheduling another BE test but Dr Michaell Cowing wants it for the end of August or beginning of September. I told pt that I would call Meriam Sprague [phone]./ [facility]

## 2011-02-19 ENCOUNTER — Inpatient Hospital Stay (HOSPITAL_COMMUNITY): Admission: RE | Admit: 2011-02-19 | Payer: Self-pay | Source: Ambulatory Visit | Admitting: Surgery

## 2011-02-21 ENCOUNTER — Telehealth (INDEPENDENT_AMBULATORY_CARE_PROVIDER_SITE_OTHER): Payer: Self-pay

## 2011-02-21 DIAGNOSIS — K5732 Diverticulitis of large intestine without perforation or abscess without bleeding: Secondary | ICD-10-CM

## 2011-02-21 NOTE — Telephone Encounter (Signed)
LMOM for [facility] to call me so I can give her info on pt's xray being scheduled at [facility] on 03-02-11 9:45/10:00am Pt needs to go pick up prep at [facility]/ [facility]

## 2011-03-02 ENCOUNTER — Ambulatory Visit (HOSPITAL_COMMUNITY)
Admission: RE | Admit: 2011-03-02 | Discharge: 2011-03-02 | Disposition: A | Discharge status: Home / Self Care | Payer: Self-pay | Source: Ambulatory Visit | Attending: Surgery | Admitting: Surgery

## 2011-03-02 DIAGNOSIS — K5732 Diverticulitis of large intestine without perforation or abscess without bleeding: Secondary | ICD-10-CM | POA: Insufficient documentation

## 2011-03-08 ENCOUNTER — Encounter (INDEPENDENT_AMBULATORY_CARE_PROVIDER_SITE_OTHER): Payer: Self-pay | Admitting: Surgery

## 2011-03-26 ENCOUNTER — Emergency Department (HOSPITAL_COMMUNITY): Payer: Self-pay

## 2011-03-26 ENCOUNTER — Emergency Department (HOSPITAL_COMMUNITY)
Admission: EM | Admit: 2011-03-26 | Discharge: 2011-03-26 | Disposition: A | Discharge status: Home / Self Care | Payer: Self-pay | Attending: Emergency Medicine | Admitting: Emergency Medicine

## 2011-03-26 ENCOUNTER — Encounter (HOSPITAL_COMMUNITY): Payer: Self-pay | Admitting: Radiology

## 2011-03-26 DIAGNOSIS — R3915 Urgency of urination: Secondary | ICD-10-CM | POA: Insufficient documentation

## 2011-03-26 DIAGNOSIS — I10 Essential (primary) hypertension: Secondary | ICD-10-CM | POA: Insufficient documentation

## 2011-03-26 DIAGNOSIS — I519 Heart disease, unspecified: Secondary | ICD-10-CM | POA: Insufficient documentation

## 2011-03-26 DIAGNOSIS — R109 Unspecified abdominal pain: Secondary | ICD-10-CM | POA: Insufficient documentation

## 2011-03-26 DIAGNOSIS — M549 Dorsalgia, unspecified: Secondary | ICD-10-CM | POA: Insufficient documentation

## 2011-03-26 DIAGNOSIS — Z79899 Other long term (current) drug therapy: Secondary | ICD-10-CM | POA: Insufficient documentation

## 2011-03-26 LAB — CBC
HCT: 35.7 % — ABNORMAL LOW (ref 39.0–52.0)
Hemoglobin: 11.8 g/dL — ABNORMAL LOW (ref 13.0–17.0)
MCHC: 33.1 g/dL (ref 30.0–36.0)
MCV: 84 fL (ref 78.0–100.0)
RDW: 15.1 % (ref 11.5–15.5)
WBC: 9.6 10*3/uL (ref 4.0–10.5)

## 2011-03-26 LAB — DIFFERENTIAL
Eosinophils Relative: 2 % (ref 0–5)
Lymphocytes Relative: 28 % (ref 12–46)
Lymphs Abs: 2.7 10*3/uL (ref 0.7–4.0)
Monocytes Absolute: 0.9 10*3/uL (ref 0.1–1.0)

## 2011-03-26 LAB — COMPREHENSIVE METABOLIC PANEL
Albumin: 4.2 g/dL (ref 3.5–5.2)
BUN: 22 mg/dL (ref 6–23)
Chloride: 105 mEq/L (ref 96–112)
Creatinine, Ser: 0.99 mg/dL (ref 0.50–1.35)
GFR calc Af Amer: >60 mL/min (ref >60)
Glucose, Bld: 93 mg/dL (ref 70–99)
Total Bilirubin: 0.2 mg/dL — ABNORMAL LOW (ref 0.3–1.2)

## 2011-03-26 LAB — URINALYSIS, ROUTINE W REFLEX MICROSCOPIC
Bilirubin Urine: NEGATIVE
Leukocytes, UA: NEGATIVE
Nitrite: NEGATIVE
Specific Gravity, Urine: 1.039 — ABNORMAL HIGH (ref 1.005–1.030)
Urobilinogen, UA: 0.2 mg/dL (ref 0.0–1.0)
pH: 5.5 (ref 5.0–8.0)

## 2011-05-08 ENCOUNTER — Telehealth (INDEPENDENT_AMBULATORY_CARE_PROVIDER_SITE_OTHER): Payer: Self-pay

## 2011-05-08 DIAGNOSIS — Z932 Ileostomy status: Secondary | ICD-10-CM

## 2011-05-08 NOTE — Telephone Encounter (Signed)
Called pt to notify him that I have him scheduled for his xray at [facility] but the pt couldn't take the info down at the moment. The pt will call me back to get his appt time along with directions.Hulda Humphrey

## 2011-05-11 ENCOUNTER — Ambulatory Visit (HOSPITAL_COMMUNITY)
Admission: RE | Admit: 2011-05-11 | Discharge: 2011-05-11 | Disposition: A | Discharge status: Home / Self Care | Payer: Self-pay | Source: Ambulatory Visit | Attending: Surgery | Admitting: Surgery

## 2011-05-11 DIAGNOSIS — Z01818 Encounter for other preprocedural examination: Secondary | ICD-10-CM | POA: Insufficient documentation

## 2011-05-11 DIAGNOSIS — Z932 Ileostomy status: Secondary | ICD-10-CM

## 2011-05-14 ENCOUNTER — Other Ambulatory Visit (INDEPENDENT_AMBULATORY_CARE_PROVIDER_SITE_OTHER): Payer: Self-pay | Admitting: Surgery

## 2011-06-14 ENCOUNTER — Encounter (HOSPITAL_COMMUNITY): Payer: Self-pay | Admitting: Pharmacy Technician

## 2011-06-21 ENCOUNTER — Encounter (HOSPITAL_COMMUNITY): Payer: Self-pay | Admitting: Anesthesiology

## 2011-06-21 ENCOUNTER — Encounter (HOSPITAL_COMMUNITY): Payer: Self-pay

## 2011-06-21 ENCOUNTER — Inpatient Hospital Stay (HOSPITAL_COMMUNITY): Payer: Self-pay | Admitting: Anesthesiology

## 2011-06-21 ENCOUNTER — Inpatient Hospital Stay (HOSPITAL_COMMUNITY)
Admission: RE | Admit: 2011-06-21 | Discharge: 2011-07-01 | DRG: 345 | Disposition: A | Discharge status: Home / Self Care | Payer: Self-pay | Source: Ambulatory Visit | Attending: Surgery | Admitting: Surgery

## 2011-06-21 ENCOUNTER — Inpatient Hospital Stay (HOSPITAL_COMMUNITY): Payer: Self-pay

## 2011-06-21 ENCOUNTER — Encounter (HOSPITAL_COMMUNITY)
Admission: RE | Disposition: A | Discharge status: Home / Self Care | Payer: Self-pay | Source: Ambulatory Visit | Attending: Surgery

## 2011-06-21 ENCOUNTER — Other Ambulatory Visit: Payer: Self-pay

## 2011-06-21 DIAGNOSIS — I10 Essential (primary) hypertension: Secondary | ICD-10-CM | POA: Diagnosis present

## 2011-06-21 DIAGNOSIS — K929 Disease of digestive system, unspecified: Secondary | ICD-10-CM | POA: Diagnosis not present

## 2011-06-21 DIAGNOSIS — R0902 Hypoxemia: Secondary | ICD-10-CM | POA: Diagnosis not present

## 2011-06-21 DIAGNOSIS — K567 Ileus, unspecified: Secondary | ICD-10-CM | POA: Diagnosis not present

## 2011-06-21 DIAGNOSIS — F17200 Nicotine dependence, unspecified, uncomplicated: Secondary | ICD-10-CM | POA: Diagnosis present

## 2011-06-21 DIAGNOSIS — J9811 Atelectasis: Secondary | ICD-10-CM | POA: Diagnosis not present

## 2011-06-21 DIAGNOSIS — I498 Other specified cardiac arrhythmias: Secondary | ICD-10-CM | POA: Diagnosis not present

## 2011-06-21 DIAGNOSIS — K631 Perforation of intestine (nontraumatic): Secondary | ICD-10-CM | POA: Diagnosis present

## 2011-06-21 DIAGNOSIS — R0602 Shortness of breath: Secondary | ICD-10-CM | POA: Diagnosis not present

## 2011-06-21 DIAGNOSIS — J988 Other specified respiratory disorders: Secondary | ICD-10-CM | POA: Diagnosis not present

## 2011-06-21 DIAGNOSIS — R1013 Epigastric pain: Secondary | ICD-10-CM | POA: Diagnosis not present

## 2011-06-21 DIAGNOSIS — Y834 Other reconstructive surgery as the cause of abnormal reaction of the patient, or of later complication, without mention of misadventure at the time of the procedure: Secondary | ICD-10-CM | POA: Diagnosis not present

## 2011-06-21 DIAGNOSIS — Z433 Encounter for attention to colostomy: Secondary | ICD-10-CM

## 2011-06-21 DIAGNOSIS — Z9049 Acquired absence of other specified parts of digestive tract: Secondary | ICD-10-CM

## 2011-06-21 DIAGNOSIS — Z432 Encounter for attention to ileostomy: Principal | ICD-10-CM

## 2011-06-21 DIAGNOSIS — K560 Paralytic ileus: Secondary | ICD-10-CM | POA: Diagnosis not present

## 2011-06-21 DIAGNOSIS — J9819 Other pulmonary collapse: Secondary | ICD-10-CM | POA: Diagnosis not present

## 2011-06-21 HISTORY — DX: Sleep apnea, unspecified: G47.30

## 2011-06-21 HISTORY — DX: Shortness of breath: R06.02

## 2011-06-21 HISTORY — PX: ILEO LOOP DIVERSION: SHX1780

## 2011-06-21 LAB — TYPE AND SCREEN
ABO/RH(D): A NEG
Antibody Screen: NEGATIVE

## 2011-06-21 LAB — CREATININE, SERUM
Creatinine, Ser: 0.85 mg/dL (ref 0.50–1.35)
GFR calc Af Amer: >90 mL/min (ref >90)
GFR calc non Af Amer: >90 mL/min (ref >90)

## 2011-06-21 LAB — COMPREHENSIVE METABOLIC PANEL
Alkaline Phosphatase: 60 U/L (ref 39–117)
BUN: 8 mg/dL (ref 6–23)
Chloride: 109 mEq/L (ref 96–112)
Creatinine, Ser: 0.84 mg/dL (ref 0.50–1.35)
GFR calc Af Amer: >90 mL/min (ref >90)
GFR calc non Af Amer: >90 mL/min (ref >90)
Glucose, Bld: 97 mg/dL (ref 70–99)
Potassium: 3.4 mEq/L — ABNORMAL LOW (ref 3.5–5.1)
Total Bilirubin: 0.3 mg/dL (ref 0.3–1.2)

## 2011-06-21 LAB — CBC
HCT: 34.8 % — ABNORMAL LOW (ref 39.0–52.0)
Hemoglobin: 11.6 g/dL — ABNORMAL LOW (ref 13.0–17.0)
Hemoglobin: 10.8 g/dL — ABNORMAL LOW (ref 13.0–17.0)
MCV: 84.1 fL (ref 78.0–100.0)
Platelets: 316 10*3/uL (ref 150–400)
RBC: 3.9 MIL/uL — ABNORMAL LOW (ref 4.22–5.81)
WBC: 7.6 10*3/uL (ref 4.0–10.5)
WBC: 7.3 10*3/uL (ref 4.0–10.5)

## 2011-06-21 LAB — SURGICAL PCR SCREEN: Staphylococcus aureus: NEGATIVE

## 2011-06-21 SURGERY — CREATION, ILEOSTOMY, LOOP
Anesthesia: General | Wound class: Clean Contaminated

## 2011-06-21 MED ORDER — ISOSORBIDE MONONITRATE ER 30 MG PO TB24
30.0000 mg | ORAL_TABLET | Freq: Two times a day (BID) | ORAL | Status: DC
  Administered 2011-06-21 – 2011-06-25 (×7): 30 mg via ORAL
  Filled 2011-06-21 (×11): qty 1

## 2011-06-21 MED ORDER — CIPROFLOXACIN IN D5W 400 MG/200ML IV SOLN
INTRAVENOUS | Status: DC | PRN
  Administered 2011-06-21: 400 mg via INTRAVENOUS

## 2011-06-21 MED ORDER — FENTANYL CITRATE 0.05 MG/ML IJ SOLN
INTRAMUSCULAR | Status: DC | PRN
  Administered 2011-06-21 (×5): 50 ug via INTRAVENOUS

## 2011-06-21 MED ORDER — PROMETHAZINE HCL 25 MG/ML IJ SOLN: 6.2500 mg | INTRAMUSCULAR | Status: DC | PRN

## 2011-06-21 MED ORDER — DIPHENHYDRAMINE HCL 50 MG/ML IJ SOLN: 12.5000 mg | Freq: Four times a day (QID) | INTRAMUSCULAR | Status: DC | PRN

## 2011-06-21 MED ORDER — HYDROMORPHONE HCL PF 1 MG/ML IJ SOLN
0.2500 mg | INTRAMUSCULAR | Status: DC | PRN
  Administered 2011-06-21 (×2): 0.5 mg via INTRAVENOUS

## 2011-06-21 MED ORDER — ACETAMINOPHEN 10 MG/ML IV SOLN
INTRAVENOUS | Status: DC | PRN
  Administered 2011-06-21: 1000 mg via INTRAVENOUS

## 2011-06-21 MED ORDER — LACTATED RINGERS IV SOLN
INTRAVENOUS | Status: DC | PRN
Start: 2011-06-21 — End: 2011-06-21
  Administered 2011-06-21 (×2): via INTRAVENOUS

## 2011-06-21 MED ORDER — ONDANSETRON HCL 4 MG/2ML IJ SOLN
4.0000 mg | Freq: Four times a day (QID) | INTRAMUSCULAR | Status: DC | PRN
  Administered 2011-06-24 – 2011-06-27 (×3): 4 mg via INTRAVENOUS
  Filled 2011-06-21 (×3): qty 2

## 2011-06-21 MED ORDER — GLYCOPYRROLATE 0.2 MG/ML IJ SOLN
INTRAMUSCULAR | Status: DC | PRN
  Administered 2011-06-21: .6 mg via INTRAVENOUS

## 2011-06-21 MED ORDER — HEPARIN SODIUM (PORCINE) 5000 UNIT/ML IJ SOLN
5000.0000 [IU] | Freq: Three times a day (TID) | INTRAMUSCULAR | Status: DC
  Administered 2011-06-21 – 2011-07-01 (×28): 5000 [IU] via SUBCUTANEOUS
  Filled 2011-06-21 (×33): qty 1

## 2011-06-21 MED ORDER — PSYLLIUM 95 % PO PACK
1.0000 | PACK | Freq: Two times a day (BID) | ORAL | Status: DC
  Administered 2011-06-22 – 2011-06-24 (×5): 1 via ORAL
  Filled 2011-06-21 (×9): qty 1

## 2011-06-21 MED ORDER — ACETAMINOPHEN 325 MG PO TABS
325.0000 mg | ORAL_TABLET | Freq: Four times a day (QID) | ORAL | Status: DC | PRN
  Administered 2011-06-24: 650 mg via ORAL
  Filled 2011-06-21: qty 2

## 2011-06-21 MED ORDER — HYDROMORPHONE HCL PF 1 MG/ML IJ SOLN
0.5000 mg | INTRAMUSCULAR | Status: DC | PRN
  Administered 2011-06-21 (×3): 1 mg via INTRAVENOUS
  Administered 2011-06-22: 2 mg via INTRAVENOUS
  Administered 2011-06-22: 1 mg via INTRAVENOUS
  Administered 2011-06-22: 2 mg via INTRAVENOUS
  Administered 2011-06-22 – 2011-06-23 (×3): 1 mg via INTRAVENOUS
  Administered 2011-06-23 (×3): 2 mg via INTRAVENOUS
  Administered 2011-06-23: 1 mg via INTRAVENOUS
  Administered 2011-06-23: 2 mg via INTRAVENOUS
  Administered 2011-06-24: 1 mg via INTRAVENOUS
  Administered 2011-06-24: 2 mg via INTRAVENOUS
  Administered 2011-06-24 – 2011-06-25 (×8): 1 mg via INTRAVENOUS
  Administered 2011-06-25: 2 mg via INTRAVENOUS
  Administered 2011-06-25: 1 mg via INTRAVENOUS
  Administered 2011-06-25: 2 mg via INTRAVENOUS
  Administered 2011-06-25 – 2011-06-26 (×6): 1 mg via INTRAVENOUS
  Filled 2011-06-21 (×2): qty 1
  Filled 2011-06-21: qty 2
  Filled 2011-06-21 (×4): qty 1
  Filled 2011-06-21: qty 2
  Filled 2011-06-21 (×2): qty 1
  Filled 2011-06-21: qty 2
  Filled 2011-06-21 (×2): qty 1
  Filled 2011-06-21: qty 2
  Filled 2011-06-21: qty 1
  Filled 2011-06-21 (×2): qty 2
  Filled 2011-06-21 (×5): qty 1
  Filled 2011-06-21 (×2): qty 2
  Filled 2011-06-21 (×9): qty 1

## 2011-06-21 MED ORDER — SUCCINYLCHOLINE CHLORIDE 20 MG/ML IJ SOLN
INTRAMUSCULAR | Status: DC | PRN
  Administered 2011-06-21: 100 mg via INTRAVENOUS

## 2011-06-21 MED ORDER — EPHEDRINE SULFATE 50 MG/ML IJ SOLN
INTRAMUSCULAR | Status: DC | PRN
  Administered 2011-06-21: 10 mg via INTRAVENOUS

## 2011-06-21 MED ORDER — NEOSTIGMINE METHYLSULFATE 1 MG/ML IJ SOLN
INTRAMUSCULAR | Status: DC | PRN
  Administered 2011-06-21: 3 mg via INTRAVENOUS

## 2011-06-21 MED ORDER — LACTATED RINGERS IV SOLN: INTRAVENOUS | Status: DC | PRN

## 2011-06-21 MED ORDER — HYDROMORPHONE BOLUS VIA INFUSION: 0.5000 mg | INTRAVENOUS | Status: DC | PRN

## 2011-06-21 MED ORDER — FLORA-Q PO CAPS
1.0000 | ORAL_CAPSULE | Freq: Every day | ORAL | Status: DC
  Administered 2011-06-22 – 2011-06-24 (×3): 1 via ORAL
  Filled 2011-06-21 (×5): qty 1

## 2011-06-21 MED ORDER — ONDANSETRON HCL 4 MG/2ML IJ SOLN: 4.0000 mg | Freq: Four times a day (QID) | INTRAMUSCULAR | Status: DC | PRN

## 2011-06-21 MED ORDER — ALUM & MAG HYDROXIDE-SIMETH 200-200-20 MG/5ML PO SUSP: 30.0000 mL | Freq: Four times a day (QID) | ORAL | Status: DC | PRN

## 2011-06-21 MED ORDER — PHENYLEPHRINE HCL 10 MG/ML IJ SOLN
INTRAMUSCULAR | Status: DC | PRN
  Administered 2011-06-21 (×2): 20 ug via INTRAVENOUS
  Administered 2011-06-21: 10 ug via INTRAVENOUS

## 2011-06-21 MED ORDER — GUAIFENESIN-DM 100-10 MG/5ML PO SYRP: 15.0000 mL | ORAL_SOLUTION | ORAL | Status: DC | PRN

## 2011-06-21 MED ORDER — METOPROLOL TARTRATE 1 MG/ML IV SOLN
5.0000 mg | Freq: Four times a day (QID) | INTRAVENOUS | Status: DC | PRN
  Filled 2011-06-21: qty 5

## 2011-06-21 MED ORDER — OXYCODONE HCL 5 MG PO TABS: 5.0000 mg | ORAL_TABLET | ORAL | Status: DC | PRN

## 2011-06-21 MED ORDER — MIDAZOLAM HCL 5 MG/5ML IJ SOLN
INTRAMUSCULAR | Status: DC | PRN
  Administered 2011-06-21 (×2): 1 mg via INTRAVENOUS

## 2011-06-21 MED ORDER — CISATRACURIUM BESYLATE 2 MG/ML IV SOLN
INTRAVENOUS | Status: DC | PRN
  Administered 2011-06-21: 6 mg via INTRAVENOUS
  Administered 2011-06-21: 2 mg via INTRAVENOUS

## 2011-06-21 MED ORDER — PROMETHAZINE HCL 25 MG/ML IJ SOLN: 12.5000 mg | Freq: Four times a day (QID) | INTRAMUSCULAR | Status: DC | PRN

## 2011-06-21 MED ORDER — LIP MEDEX EX OINT
1.0000 "application " | TOPICAL_OINTMENT | Freq: Two times a day (BID) | CUTANEOUS | Status: DC
  Administered 2011-06-22 – 2011-06-30 (×15): 1 via TOPICAL
  Filled 2011-06-21: qty 7

## 2011-06-21 MED ORDER — METRONIDAZOLE IN NACL 5-0.79 MG/ML-% IV SOLN: 500.0000 mg | INTRAVENOUS | Status: DC

## 2011-06-21 MED ORDER — KETOROLAC TROMETHAMINE 30 MG/ML IJ SOLN
INTRAMUSCULAR | Status: DC | PRN
  Administered 2011-06-21: 30 mg via INTRAVENOUS

## 2011-06-21 MED ORDER — 0.9 % SODIUM CHLORIDE (POUR BTL) OPTIME
TOPICAL | Status: DC | PRN
  Administered 2011-06-21: 2000 mL

## 2011-06-21 MED ORDER — LACTATED RINGERS IV BOLUS (SEPSIS): 1000.0000 mL | Freq: Three times a day (TID) | INTRAVENOUS | Status: AC | PRN

## 2011-06-21 MED ORDER — ONDANSETRON HCL 4 MG PO TABS: 4.0000 mg | ORAL_TABLET | Freq: Four times a day (QID) | ORAL | Status: DC | PRN

## 2011-06-21 MED ORDER — NAPROXEN 500 MG PO TABS
500.0000 mg | ORAL_TABLET | Freq: Two times a day (BID) | ORAL | Status: DC
  Administered 2011-06-22 – 2011-06-24 (×6): 500 mg via ORAL
  Filled 2011-06-21 (×9): qty 1

## 2011-06-21 MED ORDER — ONDANSETRON 8 MG/NS 50 ML IVPB
8.0000 mg | Freq: Four times a day (QID) | INTRAVENOUS | Status: DC | PRN
  Filled 2011-06-21: qty 8

## 2011-06-21 MED ORDER — ALVIMOPAN 12 MG PO CAPS: 12.0000 mg | ORAL_CAPSULE | Freq: Once | ORAL | Status: DC

## 2011-06-21 MED ORDER — HEPARIN SODIUM (PORCINE) 5000 UNIT/ML IJ SOLN
5000.0000 [IU] | Freq: Once | INTRAMUSCULAR | Status: AC
  Administered 2011-06-21: 5000 [IU] via SUBCUTANEOUS

## 2011-06-21 MED ORDER — ALBUTEROL SULFATE (5 MG/ML) 0.5% IN NEBU: 2.5000 mg | INHALATION_SOLUTION | Freq: Four times a day (QID) | RESPIRATORY_TRACT | Status: DC | PRN

## 2011-06-21 MED ORDER — ALVIMOPAN 12 MG PO CAPS
12.0000 mg | ORAL_CAPSULE | Freq: Once | ORAL | Status: AC
  Administered 2011-06-21: 12 mg via ORAL

## 2011-06-21 MED ORDER — CIPROFLOXACIN IN D5W 400 MG/200ML IV SOLN: 400.0000 mg | INTRAVENOUS | Status: DC

## 2011-06-21 MED ORDER — HYDROMORPHONE HCL PF 1 MG/ML IJ SOLN
INTRAMUSCULAR | Status: AC
  Administered 2011-06-21: 1 mg via INTRAVENOUS
  Filled 2011-06-21: qty 1

## 2011-06-21 MED ORDER — DEXTROSE IN LACTATED RINGERS 5 % IV SOLN
INTRAVENOUS | Status: DC
  Administered 2011-06-21 – 2011-06-23 (×3): via INTRAVENOUS

## 2011-06-21 MED ORDER — ZOLPIDEM TARTRATE 5 MG PO TABS: 5.0000 mg | ORAL_TABLET | Freq: Every evening | ORAL | Status: DC | PRN

## 2011-06-21 MED ORDER — MAGIC MOUTHWASH
15.0000 mL | Freq: Four times a day (QID) | ORAL | Status: DC | PRN
  Administered 2011-06-25: 15 mL via ORAL
  Administered 2011-06-25: 10 mL via ORAL
  Administered 2011-06-25: 5 mL via ORAL
  Administered 2011-06-26 – 2011-06-28 (×4): 15 mL via ORAL
  Filled 2011-06-21 (×6): qty 15

## 2011-06-21 MED ORDER — LACTATED RINGERS IV SOLN: INTRAVENOUS | Status: DC

## 2011-06-21 MED ORDER — LACTATED RINGERS IV SOLN
INTRAVENOUS | Status: DC
  Administered 2011-06-21: 15:00:00 via INTRAVENOUS

## 2011-06-21 MED ORDER — ONDANSETRON HCL 4 MG/2ML IJ SOLN
INTRAMUSCULAR | Status: DC | PRN
  Administered 2011-06-21 (×2): 2 mg via INTRAVENOUS

## 2011-06-21 MED ORDER — PROPOFOL 10 MG/ML IV EMUL
INTRAVENOUS | Status: DC | PRN
  Administered 2011-06-21: 150 mg via INTRAVENOUS

## 2011-06-21 SURGICAL SUPPLY — 64 items
APPLICATOR COTTON TIP 6IN STRL (MISCELLANEOUS) ×2 IMPLANT
BLADE EXTENDED COATED 6.5IN (ELECTRODE) ×2 IMPLANT
BLADE HEX COATED 2.75 (ELECTRODE) ×2 IMPLANT
BLADE SURG SZ10 CARB STEEL (BLADE) IMPLANT
CANISTER SUCTION 2500CC (MISCELLANEOUS) ×2 IMPLANT
CLIP TI LARGE 6 (CLIP) IMPLANT
CLOTH BEACON ORANGE TIMEOUT ST (SAFETY) ×2 IMPLANT
COVER MAYO STAND STRL (DRAPES) ×2 IMPLANT
COVER SURGICAL LIGHT HANDLE (MISCELLANEOUS) ×2 IMPLANT
DRAIN CHANNEL 10F 3/8 F FF (DRAIN) IMPLANT
DRAPE INCISE IOBAN 66X45 STRL (DRAPES) IMPLANT
DRAPE LAPAROSCOPIC ABDOMINAL (DRAPES) ×2 IMPLANT
DRAPE LG THREE QUARTER DISP (DRAPES) IMPLANT
DRAPE UTILITY XL STRL (DRAPES) ×2 IMPLANT
DRAPE WARM FLUID 44X44 (DRAPE) ×2 IMPLANT
DRSG PAD ABDOMINAL 8X10 ST (GAUZE/BANDAGES/DRESSINGS) IMPLANT
DRSG TEGADERM 4X4.75 (GAUZE/BANDAGES/DRESSINGS) ×2 IMPLANT
ELECT REM PT RETURN 9FT ADLT (ELECTROSURGICAL) ×2
ELECTRODE REM PT RTRN 9FT ADLT (ELECTROSURGICAL) ×1 IMPLANT
EVACUATOR DRAINAGE 10X20 100CC (DRAIN) IMPLANT
EVACUATOR SILICONE 100CC (DRAIN) IMPLANT
GAUZE SPONGE 2X2 8PLY STRL LF (GAUZE/BANDAGES/DRESSINGS) ×1 IMPLANT
GAUZE SPONGE 4X4 16PLY XRAY LF (GAUZE/BANDAGES/DRESSINGS) IMPLANT
GLOVE BIOGEL PI IND STRL 7.0 (GLOVE) ×1 IMPLANT
GLOVE BIOGEL PI INDICATOR 7.0 (GLOVE) ×1
GLOVE ECLIPSE 8.0 STRL XLNG CF (GLOVE) ×2 IMPLANT
GLOVE INDICATOR 8.0 STRL GRN (GLOVE) ×4 IMPLANT
GOWN STRL NON-REIN LRG LVL3 (GOWN DISPOSABLE) ×2 IMPLANT
GOWN STRL REIN XL XLG (GOWN DISPOSABLE) ×4 IMPLANT
HAND ACTIVATED (MISCELLANEOUS) IMPLANT
KIT BASIN OR (CUSTOM PROCEDURE TRAY) ×2 IMPLANT
LEGGING LITHOTOMY PAIR STRL (DRAPES) IMPLANT
NS IRRIG 1000ML POUR BTL (IV SOLUTION) ×4 IMPLANT
PACK GENERAL/GYN (CUSTOM PROCEDURE TRAY) ×2 IMPLANT
PAD TELFA 2X3 NADH STRL (GAUZE/BANDAGES/DRESSINGS) IMPLANT
SPONGE GAUZE 2X2 STER 10/PKG (GAUZE/BANDAGES/DRESSINGS) ×1
SPONGE GAUZE 4X4 12PLY (GAUZE/BANDAGES/DRESSINGS) ×2 IMPLANT
STAPLER GUN LINEAR PROX 60 (STAPLE) ×2 IMPLANT
STAPLER PROXIMATE 75MM BLUE (STAPLE) ×2 IMPLANT
STAPLER VISISTAT 35W (STAPLE) ×2 IMPLANT
SUCTION POOLE TIP (SUCTIONS) ×2 IMPLANT
SUT CHROMIC 0 SH (SUTURE) IMPLANT
SUT CHROMIC 2 0 TIES 18 (SUTURE) IMPLANT
SUT MNCRL AB 4-0 PS2 18 (SUTURE) ×2 IMPLANT
SUT NOV 1 T60/GS (SUTURE) IMPLANT
SUT NOVA NAB DX-16 0-1 5-0 T12 (SUTURE) IMPLANT
SUT NOVA T20/GS 25 (SUTURE) IMPLANT
SUT PDS AB 1 CTX 36 (SUTURE) ×4 IMPLANT
SUT SILK 2 0 (SUTURE) ×1
SUT SILK 2 0 SH CR/8 (SUTURE) ×2 IMPLANT
SUT SILK 2 0SH CR/8 30 (SUTURE) IMPLANT
SUT SILK 2-0 18XBRD TIE 12 (SUTURE) ×1 IMPLANT
SUT SILK 2-0 30XBRD TIE 12 (SUTURE) IMPLANT
SUT SILK 3 0 (SUTURE)
SUT SILK 3 0 SH CR/8 (SUTURE) ×2 IMPLANT
SUT SILK 3-0 18XBRD TIE 12 (SUTURE) IMPLANT
SUT VIC AB 2-0 SH 18 (SUTURE) IMPLANT
SUT VIC AB 3-0 SH 18 (SUTURE) ×2 IMPLANT
SUT VICRYL 0 UR6 27IN ABS (SUTURE) ×2 IMPLANT
SUT VICRYL 2 0 18  UND BR (SUTURE) ×2
SUT VICRYL 2 0 18 UND BR (SUTURE) ×2 IMPLANT
TOWEL OR 17X26 10 PK STRL BLUE (TOWEL DISPOSABLE) ×4 IMPLANT
TRAY FOLEY CATH 14FRSI W/METER (CATHETERS) ×2 IMPLANT
YANKAUER SUCT BULB TIP NO VENT (SUCTIONS) ×2 IMPLANT

## 2011-06-21 NOTE — Anesthesia Preprocedure Evaluation (Addendum)
Anesthesia Evaluation  Patient identified by MRN, date of birth, ID band Patient awake    Reviewed: Allergy & Precautions, H&P , NPO status , Patient's Chart, lab work & pertinent test results, reviewed documented beta blocker date and time   Airway Mallampati: II TM Distance: >3 FB Neck ROM: full    Dental  (+) Edentulous Upper and Edentulous Lower   Pulmonary shortness of breath and with exertion, sleep apnea , Current Smoker,  clear to auscultation  Pulmonary exam normal       Cardiovascular Exercise Tolerance: Good hypertension, + CAD regular Normal Gets tight chest and SOB if doesn't take isosorbide.  Stress test showed no major problems last year.   Neuro/Psych Negative Neurological ROS  Negative Psych ROS   GI/Hepatic negative GI ROS, Neg liver ROS,   Endo/Other  Negative Endocrine ROS  Renal/GU negative Renal ROS  Genitourinary negative   Musculoskeletal   Abdominal   Peds  Hematology negative hematology ROS (+)   Anesthesia Other Findings   Reproductive/Obstetrics negative OB ROS                         Anesthesia Physical Anesthesia Plan  ASA: III  Anesthesia Plan: General   Post-op Pain Management:    Induction: Intravenous  Airway Management Planned: Oral ETT  Additional Equipment:   Intra-op Plan:   Post-operative Plan: Extubation in OR  Informed Consent: I have reviewed the patients History and Physical, chart, labs and discussed the procedure including the risks, benefits and alternatives for the proposed anesthesia with the patient or authorized representative who has indicated his/her understanding and acceptance.   Dental Advisory Given  Plan Discussed with: CRNA and Surgeon  Anesthesia Plan Comments:         Anesthesia Quick Evaluation

## 2011-06-21 NOTE — Transfer of Care (Signed)
Immediate Anesthesia Transfer of Care Note  Patient: Scott Berry  Procedure(s) Performed:  ILEO LOOP COLOSTOMY - takedown  Patient Location: PACU  Anesthesia Type: General  Level of Consciousness: awake, alert , oriented and patient cooperative  Airway & Oxygen Therapy: Patient Spontanous Breathing and Patient connected to face mask oxygen  Post-op Assessment: Report given to PACU RN and Post -op Vital signs reviewed and stable  Post vital signs: Reviewed and stable  Complications: No apparent anesthesia complications

## 2011-06-21 NOTE — Anesthesia Postprocedure Evaluation (Signed)
  Anesthesia Post-op Note  Patient: Scott Berry  Procedure(s) Performed:  ILEO LOOP COLOSTOMY - takedown  Patient Location: PACU  Anesthesia Type: General  Level of Consciousness: awake and alert   Airway and Oxygen Therapy: Patient Spontanous Breathing  Post-op Pain: mild  Post-op Assessment: Post-op Vital signs reviewed, Patient's Cardiovascular Status Stable, Respiratory Function Stable, Patent Airway and No signs of Nausea or vomiting  Post-op Vital Signs: stable  Complications: No apparent anesthesia complications

## 2011-06-21 NOTE — Preoperative (Signed)
Beta Blockers   Reason not to administer Beta Blockers:Not Applicable 

## 2011-06-21 NOTE — Progress Notes (Addendum)
TO OR via stretcher then to OR

## 2011-06-21 NOTE — H&P (Signed)
Scott Berry  09-Jun-1961 [id number]  Patient Care Team: Ardeth Sportsman, MD as PCP - General (General Surgery) Donato Schultz, MD as Consulting Physician (Cardiology)  This patient is a 50 y.o.male who presents today for surgical evaluation.   HPI  Procedure: Emergent lap assisted sigmoid colectomy with diverting ileostomy June 2012 for traumatic perforation at the rectosigmoid junction.  The patient is better. He is emptying the ileostomy about every 3 hours. He no longer feels lightheaded or dizzy. His appetite is good. He notes he gets a little burning irritation around the ileostomy bag. He's getting better.   He's urinating well. He gets minimal mucus drainage from the anus. He's not had any other traumatic events. Overall he feels well. He's ranged in getting the ileostomy takedown.   Patient Active Problem List  Diagnoses  . Perforation of colon - s/p Sigmoid colectomy / diverting ileostomy    Past Medical History  Diagnosis Date  . Hypertension   . Sleep apnea 1995    not a problem now  . Shortness of breath     if don't take medicines  . Coronary artery arteriosclerosis     Past Surgical History  Procedure Date  . Foreign body removal rectal Oct 2011  . Colon surgery [id number]    colectomy/diverting ileostomy - colon perf  . Cardiac catheterization 09/10/2009    Results in chart    History   Social History  . Marital Status: Divorced    Spouse Name: N/A    Number of Children: N/A  . Years of Education: N/A   Occupational History  . Not on file.   Social History Main Topics  . Smoking status: Current Everyday Smoker -- 0.5 packs/day    Types: Cigarettes  . Smokeless tobacco: Not on file   Comment: Just started up again after 3 years  . Alcohol Use: No     Recovered alcoholic x 3 years  . Drug Use: No      None in about 25 years; But has been "working around acetone"  . Sexually Active: Not on file   Other Topics Concern  . Not on file   Social  History Narrative  . No narrative on file    History reviewed. No pertinent family history.  Current facility-administered medications:alvimopan (ENTEREG) capsule 12 mg, 12 mg, Oral, Once, Ardeth Sportsman, MD, 12 mg at 06/21/11 1144;  ciprofloxacin (CIPRO) IVPB 400 mg, 400 mg, Intravenous, On Call to OR, Ardeth Sportsman, MD;  heparin injection 5,000 Units, 5,000 Units, Subcutaneous, Once, Ardeth Sportsman, MD, 5,000 Units at 06/21/11 1150;  metroNIDAZOLE (FLAGYL) IVPB 500 mg, 500 mg, Intravenous, On Call to OR, Ardeth Sportsman, MD  Allergies  Allergen Reactions  . Penicillins Anaphylaxis    BP 139/90  Pulse 100  Temp(Src) 98.1 F (36.7 C) (Oral)  Resp 18  Ht 5\' 9"  (1.753 m)  Wt 130 lb (58.968 kg)  BMI 19.20 kg/m2  SpO2 97%     Review of Systems  Constitutional: Negative for fever, chills and diaphoresis.  HENT: Negative for sore throat and trouble swallowing.  Eyes: Negative for photophobia and visual disturbance.  Respiratory: Negative for choking and shortness of breath.  Cardiovascular: Negative for chest pain and palpitations.  Gastrointestinal: Negative for nausea, vomiting, abdominal pain, diarrhea, constipation, blood in stool, abdominal distention, anal bleeding and rectal pain.  Genitourinary: Negative for dysuria, urgency, difficulty urinating and testicular pain.  Musculoskeletal: Negative for myalgias, arthralgias and gait problem.  Skin:  Negative for color change and rash.  Neurological: Negative for dizziness, speech difficulty, weakness and numbness.  Hematological: Negative for adenopathy.  Psychiatric/Behavioral: Negative for suicidal ideas, hallucinations, confusion, sleep disturbance and agitation. The patient is not nervous/anxious.  Objective:   Physical Exam  Constitutional: He is oriented to person, place, and time. He appears well-developed and well-nourished. No distress.  HENT:  Head: Normocephalic.  Mouth/Throat: Oropharynx is clear and moist. No  oropharyngeal exudate.  Eyes: Conjunctivae and EOM are normal. Pupils are equal, round, and reactive to light. No scleral icterus.  Neck: Normal range of motion. Neck supple. No tracheal deviation present.  Cardiovascular: Normal rate, regular rhythm and intact distal pulses.  Pulmonary/Chest: Effort normal and breath sounds normal. No respiratory distress.  Abdominal: Soft. He exhibits no distension and no mass. There is no tenderness. Hernia confirmed negative in the right inguinal area and confirmed negative in the left inguinal area.  Ileostomy in right lower quadrant. Pink. Thin fluid & gas in the bag.   Incisions well healed. No hernia. No wound or cellulitis.  Musculoskeletal: Normal range of motion. He exhibits no tenderness.  Lymphadenopathy:  He has no cervical adenopathy.  Right: No inguinal adenopathy present.  Left: No inguinal adenopathy present.  Neurological: He is alert and oriented to person, place, and time. No cranial nerve deficit. He exhibits normal muscle tone. Coordination normal.  Skin: Skin is warm and dry. No rash noted. He is not diaphoretic. No erythema. No pallor.  Psychiatric: He has a normal mood and affect. His behavior is normal. Judgment and thought content normal.   Barium enema confirmed no leak now (initial was questionable)   Assessment:   Status post emergent colectomy/diverting loop ileostomy for rectal perforation. Improving well.  Plan:   I did discuss the ileostomy takedown since >56months & no leak at anastomosis:   The anatomy & physiology of the digestive tract was discussed.  The pathophysiology was discussed.  Possibility of remaining with an ostomy permanently was discussed.  I offered ostomy takedown.  Laparoscopic & open techniques were discussed.   Risks such as bleeding, infection, abscess, leak, reoperation, possible re-ostomy, injury to other organs, hernia, heart attack, death, and other risks were discussed.   I noted a good  likelihood this will help address the problem.  Goals of post-operative recovery were discussed as well.  We will work to minimize complications.  Questions were answered.  The patient expresses understanding & wishes to proceed with surgery.       Avoid any further rectal trauma.

## 2011-06-21 NOTE — Brief Op Note (Addendum)
06/21/2011  2:33 PM  PATIENT:  Garlen K Dingley  50 y.o. male  PRE-OPERATIVE DIAGNOSIS:  Status Post repair rectal perforation with ileal loop diversion  POST-OPERATIVE DIAGNOSIS:  Status Post repair rectal perforation with ileal loop diversion  PROCEDURE:  Procedure(s): Takedown of loop diverting loop ileostomy  SURGEON:  Surgeon(s): Ardeth Sportsman, MD  PHYSICIAN ASSISTANT:   ASSISTANTS: RN    ANESTHESIA:   local and general  EBL:  Total I/O In: -  Out: 150 [Urine:150]  BLOOD ADMINISTERED:none  DRAINS: none   LOCAL MEDICATIONS USED:  BUPIVICAINE 50CC  SPECIMEN:  Source of Specimen:  ILEOSTOMY (not sent since Kiron Osmun WNL & no h/o Crohn's / cancer)  DISPOSITION OF SPECIMEN:  N/A  COUNTS:  YES  TOURNIQUET:  * No tourniquets in log *  DICTATION: .Other Dictation: Dictation Number [id number]  PLAN OF CARE: Admit to inpatient   PATIENT DISPOSITION:  PACU - hemodynamically stable.   Delay start of Pharmacological VTE agent (>24hrs) due to surgical blood loss or risk of bleeding: NO

## 2011-06-22 DIAGNOSIS — I10 Essential (primary) hypertension: Secondary | ICD-10-CM | POA: Diagnosis present

## 2011-06-22 LAB — CBC
HCT: 27.6 % — ABNORMAL LOW (ref 39.0–52.0)
MCH: 28 pg (ref 26.0–34.0)
MCV: 86 fL (ref 78.0–100.0)
RBC: 3.21 MIL/uL — ABNORMAL LOW (ref 4.22–5.81)
RDW: 14.4 % (ref 11.5–15.5)
WBC: 8.7 10*3/uL (ref 4.0–10.5)

## 2011-06-22 NOTE — Progress Notes (Signed)
Scott Berry Jan 10, 1961 [id number]  PCP: Scott Berry., MD, MD  Outpatient Care Team: Patient Care Team: Scott Sportsman, MD as PCP - General (General Surgery) Scott Schultz, MD as Consulting Physician (Cardiology)  Inpatient Treatment Team: Treatment Team: Attending Provider: Ardeth Sportsman, MD; Technician: Scott Berry, NT; Respiratory Therapist: Carney Berry, RRT; Registered Nurse: Scott Kenner, RN; Technician: Scott Berry, NT   LOS: 1 day   1 Day Post-Op  Procedure(s): Loop ileostomy takedown  Subjective:  No major events Taking sips OK No N/V Sore but medicines helping Not walking yet   Objective:  Vital signs:  Temp:  [97.4 F (36.3 C)-98.1 F (36.7 C)] 97.5 F (36.4 C) (12/21 0200) Pulse Rate:  [67-100] 85  (12/21 0200) Resp:  [12-20] 20  (12/21 0200) BP: (101-160)/(61-90) 118/61 mmHg (12/21 0200) SpO2:  [95 %-100 %] 95 % (12/21 0200) Weight:  [130 lb (58.968 kg)] 130 lb (58.968 kg) (12/20 1035) Last BM Date: 06/21/11  Intake/Output    from previous day: 12/20 0701 - 12/21 0700 In: 3685.6 [P.O.:240; I.V.:3445.6] Out: 600 [Urine:600]  this shift: Total I/O In: 492 [P.O.:240; I.V.:252] Out: 100 [Urine:100]  Flatus: no BM: no  Physical Exam:  General: Pt awake/alert/oriented x4 in no acute distress Eyes: PERRL, normal EOM.  Sclera clear.  No icterus Neuro: CN II-XII intact w/o focal sensory/motor deficits. Lymph: No head/neck/groin lymphadenopathy Psych:  No delerium/psychosis/paranoia HENT: Normocephalic, Mucus membranes moist.  No thrush Neck: Supple, No tracheal deviation Chest: Clear No chest wall pain w good excursion CV:  Pulses intact.  Regular rhythm Abdomen: Soft, Nontender/Nondistended.  No incarcerated hernias.  Mild TTP at R abd old ileostomy site Ext:  SCDs BLE.  No mjr edema.  No cyanosis Skin: No petechiae / purpurae  Results:   Labs: Results for orders placed during the hospital encounter of  06/21/11 (from the past 48 hour(s))  CBC     Status: Abnormal   Collection Time   06/21/11 11:40 AM      Component Value Range Comment   WBC 7.6  4.0 - 10.5 (K/uL)    RBC 4.14 (*) 4.22 - 5.81 (MIL/uL)    Hemoglobin 11.6 (*) 13.0 - 17.0 (g/dL)    HCT 40.9 (*) 81.1 - 52.0 (%)    MCV 84.1  78.0 - 100.0 (fL)    MCH 28.0  26.0 - 34.0 (pg)    MCHC 33.3  30.0 - 36.0 (g/dL)    RDW 91.4  78.2 - 95.6 (%)    Platelets 335  150 - 400 (K/uL)   COMPREHENSIVE METABOLIC PANEL     Status: Abnormal   Collection Time   06/21/11 11:40 AM      Component Value Range Comment   Sodium 141  135 - 145 (mEq/L)    Potassium 3.4 (*) 3.5 - 5.1 (mEq/L)    Chloride 109  96 - 112 (mEq/L)    CO2 26  19 - 32 (mEq/L)    Glucose, Bld 97  70 - 99 (mg/dL)    BUN 8  6 - 23 (mg/dL)    Creatinine, Ser 2.13  0.50 - 1.35 (mg/dL)    Calcium 9.4  8.4 - 10.5 (mg/dL)    Total Protein 6.7  6.0 - 8.3 (g/dL)    Albumin 3.5  3.5 - 5.2 (g/dL)    AST 11  0 - 37 (U/L)    ALT 8  0 - 53 (U/L)    Alkaline Phosphatase 60  39 - 117 (U/L)    Total Bilirubin 0.3  0.3 - 1.2 (mg/dL)    GFR calc non Af Amer >90  >90 (mL/min)    GFR calc Af Amer >90  >90 (mL/min)   TYPE AND SCREEN     Status: Normal   Collection Time   06/21/11 11:40 AM      Component Value Range Comment   ABO/RH(D) A NEG      Antibody Screen NEG      Sample Expiration 06/24/2011     ABO/RH     Status: Normal   Collection Time   06/21/11 11:40 AM      Component Value Range Comment   ABO/RH(D) A NEG     SURGICAL PCR SCREEN     Status: Normal   Collection Time   06/21/11 11:45 AM      Component Value Range Comment   MRSA, PCR NEGATIVE  NEGATIVE     Staphylococcus aureus NEGATIVE  NEGATIVE    CBC     Status: Abnormal   Collection Time   06/21/11  2:52 PM      Component Value Range Comment   WBC 7.3  4.0 - 10.5 (K/uL)    RBC 3.90 (*) 4.22 - 5.81 (MIL/uL)    Hemoglobin 10.8 (*) 13.0 - 17.0 (g/dL)    HCT 21.3 (*) 08.6 - 52.0 (%)    MCV 85.4  78.0 - 100.0 (fL)      MCH 27.7  26.0 - 34.0 (pg)    MCHC 32.4  30.0 - 36.0 (g/dL)    RDW 57.8  46.9 - 62.9 (%)    Platelets 316  150 - 400 (K/uL)   CREATININE, SERUM     Status: Normal   Collection Time   06/21/11  2:52 PM      Component Value Range Comment   Creatinine, Ser 0.85  0.50 - 1.35 (mg/dL)    GFR calc non Af Amer >90  >90 (mL/min)    GFR calc Af Amer >90  >90 (mL/min)   CBC     Status: Abnormal   Collection Time   06/22/11  3:53 AM      Component Value Range Comment   WBC 8.7  4.0 - 10.5 (K/uL)    RBC 3.21 (*) 4.22 - 5.81 (MIL/uL)    Hemoglobin 9.0 (*) 13.0 - 17.0 (g/dL)    HCT 52.8 (*) 41.3 - 52.0 (%)    MCV 86.0  78.0 - 100.0 (fL)    MCH 28.0  26.0 - 34.0 (pg)    MCHC 32.6  30.0 - 36.0 (g/dL)    RDW 24.4  01.0 - 27.2 (%)    Platelets 274  150 - 400 (K/uL)     Imaging / Studies: Dg Chest 2 View  06/21/2011  *RADIOLOGY REPORT*  Clinical Data: Preoperative respiratory exam; hypertension, coronary artery arteriosclerosis, smoker  CHEST - 2 VIEW  Comparison: April 17, 2010  Findings: The cardiac silhouette, mediastinum, pulmonary vasculature are within normal limits.  Both lungs are clear. There is no acute bony abnormality.  IMPRESSION: There is no evidence of acute cardiac or pulmonary process.  Original Report Authenticated By: Brandon Melnick, M.D.    Antibiotics: Anti-infectives     Start     Dose/Rate Route Frequency Ordered Stop   06/21/11 1032   ciprofloxacin (CIPRO) IVPB 400 mg  Status:  Discontinued        400 mg 200 mL/hr over 60 Minutes Intravenous On call  to O.R. 06/21/11 1032 06/21/11 1600   06/21/11 1032   metroNIDAZOLE (FLAGYL) IVPB 500 mg  Status:  Discontinued        500 mg 100 mL/hr over 60 Minutes Intravenous On call to O.R. 06/21/11 1032 06/21/11 1600          Medications / Allergies: per chart  Assessment / Plan: Scott Berry  50 y.o. male   1 Day Post-Op  Procedure(s): Takedown of loop ileostomy   Problem List:  Principal Problem:   *Perforation of colon - s/p Sigmoid colectomy / diverting ileostomy Active Problems:  Hypertension   -post-op ileus  -liquids PO for now  -bowel regimen  -alvimopan -PRN B blocker -VTE prophylaxis- SCDs, etc -mobilize as tolerated to help recovery   Scott Berry, M.D., F.A.C.S. Gastrointestinal and Minimally Invasive Surgery [facility] [address] [place], [place] [address] [phone] Main / Paging [phone] Voice Mail   06/22/2011

## 2011-06-22 NOTE — Op Note (Signed)
Scott Berry                 ACCOUNT NO.:  [id number]  MEDICAL RECORD NO.:  [id number]  LOCATION:  1534                         FACILITY:  [facility]  PHYSICIAN:  Ardeth Sportsman, MD     DATE OF BIRTH:  [birth year]  DATE OF PROCEDURE: DATE OF DISCHARGE:                              OPERATIVE REPORT   PRIMARY CARE PHYSICIAN:  None.  CARDIOLOGIST:  [name], MD  SURGEON:  Ardeth Sportsman, MD  ASSIST:  RN  PREOPERATIVE DIAGNOSIS:  Diverting loop ileostomy status post emergent rectosigmoid resection of rectal perforation in June, 2012.  POSTOPERATIVE DIAGNOSIS:  Diverting loop ileostomy status post emergent rectosigmoid resection of rectal perforation.  PROCEDURE PERFORMED:  Takedown of loop ileostomy.  ANESTHESIA: 1. General anesthesia. 2. Local anesthetic in a field block on all port sites.  SPECIMEN:  Ileal loop ileostomy (not sent since grossly negative and no history of Crohn's nor cancer).  DRAINS:  None.  ESTIMATED BLOOD LOSS:  30 mL.  COMPLICATIONS:  None major.  INDICATIONS:  Mr. Scott Berry is a 50 year old male who had a rectal perforation in June, 2012.  He required emergent resection.  I was able to do that laparoscopically assisted.  I did divert him to help protect the anastomosis since he does have some mild peritonitis.  He recovered and went home with ileostomy.  Followup enemas did note a small questionable leak he really is asymptomatic from.  I delayed surgery until he had evidence that this leak was closed.  The anatomy and physiology of the digestive tract was explained. Progressive loop ileostomy to protect the anastomosis was discussed. Technique of ileostomy takedown with risks, benefits, alternatives were discussed in detail.  Questions were answered and he agreed to proceed.  FINDINGS:  He had few adhesions to the anterior abdominal wall.  The looped ileum is consistent with abdominal wall movements.  No significant adhesions  intraabdominally.  I did a side-to-side staple anastomosis.  DESCRIPTION OF PROCEDURE:  Informed consent was confirmed.  The patient received IV antibiotics prior to incision.  He underwent general anesthesia without any difficulty.  He was positioned supine with arms tucked.  His abdomen was clipped, prepped, and draped in sterile fashion.  The ileostomy was prepped into the field.  A surgical time-out confirmed our plan.  I made a biconcave ellipsoid incision around the ileostomy and freed off the skin in the soft tissues.  I alternated between blunt dissection and focussed cautery as well as sharp Metzenbaum dissection to free the loop of ileum from its attachments to the anterior abdominal wall and to the fascia.  I was able to breech the peritoneum with finger sweep to prove no significant adhesions.  Eventually I was able to fully mobilize it down.  On inspection, I did see one serosal tear.  I did see evidence of some weakened serosa, so I did go ahead and do a side-to-side stapled anastomosis with a 75 GIA stapler since the ileum eviscerated well.  I closed the common defect using a TA 60 stapler.  We transected the loop of ileum including the serosa including the area with thinned down serosa with clamps and silk ties.  I closed the mesenteric defect and used the accessed ileomesentery and laid it as a patch over the TA staple line using interrupted Vicryl stitches.  That provided good closure.  The anastomosis was patent and viable.  I did do a crotch stitch with a 3-0 silk stitch at the proximal end of the side-to-side anastomosis.  Hemostasis was good.  I then returned the bowel back into the abdomen.  I did irrigation with good hemostasis.  I reapproximated the fascial defect using 0 Vicryl stitches vertically on the posterior rectus fascia and a number 1 PDS in a running fashion transversely.  I closed the skin using running 4-0 Monocryl stitch since there was not much  contamination, it was only ileum.  I left a mild gap to allow a corner of gauze to be packed into the wound to allow the ileostomy to drain.  Sterile dressing was applied.  The patient was extubated and taken to the recovery room in stable condition.  There is no family here available nor friends that he wished me to discuss case with.  I will discuss with him when he is further awake.     Ardeth Sportsman, MD     SCG/MEDQ  D:  06/21/2011  T:  06/22/2011  Job:  [id number]

## 2011-06-22 NOTE — Progress Notes (Signed)
Chart reviewed and UR completed. 

## 2011-06-22 NOTE — Progress Notes (Signed)
06/22/11, Kathi Der RNC-MNN, BSN, [phone], CM received referral for possible medication assistance, community support.  CM spoke to pt about medication assistance.  Pt states that he has not received medication assistance here  in the last 6 months-this was confirmed with Toniann Fail in the [facility] pharmacy.  Informed pt and pt's nurse that hospital will not assist with narcotics. Will await discharge medications for pt to assist.  Referred to CSW for community support issues.  Pt currently lives in a halfway house.  Discussed this with pt's nurse also and notified CSW of referral.  Will follow

## 2011-06-23 NOTE — Plan of Care (Signed)
Problem: Phase II Progression Outcomes Goal: Foley discontinued Outcome: Completed/Met Date Met:  06/23/11 dc'd 06/22/2011. Voiding wnl.

## 2011-06-23 NOTE — Progress Notes (Signed)
2 Days Post-Op  Subjective: Tolerating clears, no nausea  Objective: Vital signs in last 24 hours: Temp:  [97.5 F (36.4 C)-98.3 F (36.8 C)] 97.5 F (36.4 C) (12/22 0500) Pulse Rate:  [87-94] 88  (12/22 0500) Resp:  [18-19] 18  (12/22 0500) BP: (117-124)/(66-75) 121/66 mmHg (12/22 0500) SpO2:  [89 %-96 %] 92 % (12/22 0500) Last BM Date: 06/21/11  Intake/Output from previous day: 12/21 0701 - 12/22 0700 In: 1190.8 [I.V.:1190.8] Out: 520 [Urine:520] Intake/Output this shift: Total I/O In: 600 [P.O.:600] Out: -   General appearance: alert, cooperative and no distress GI: soft, appropriate incsional tenderness, no sign of infection, ND, +BS  Lab Results:   [facility] 06/22/11 0353 06/21/11 1452  WBC 8.7 7.3  HGB 9.0* 10.8*  HCT 27.6* 33.3*  PLT 274 316   BMET  Basename 06/21/11 1452 06/21/11 1140  NA -- 141  K -- 3.4*  CL -- 109  CO2 -- 26  GLUCOSE -- 97  BUN -- 8  CREATININE 0.85 0.84  CALCIUM -- 9.4   PT/INR No results found for this basename: LABPROT:2,INR:2 in the last 72 hours ABG No results found for this basename: PHART:2,PCO2:2,PO2:2,HCO3:2 in the last 72 hours  Studies/Results: Dg Chest 2 View  06/21/2011  *RADIOLOGY REPORT*  Clinical Data: Preoperative respiratory exam; hypertension, coronary artery arteriosclerosis, smoker  CHEST - 2 VIEW  Comparison: April 17, 2010  Findings: The cardiac silhouette, mediastinum, pulmonary vasculature are within normal limits.  Both lungs are clear. There is no acute bony abnormality.  IMPRESSION: There is no evidence of acute cardiac or pulmonary process.  Original Report Authenticated By: Brandon Melnick, M.D.    Anti-infectives: Anti-infectives     Start     Dose/Rate Route Frequency Ordered Stop   06/21/11 1032   ciprofloxacin (CIPRO) IVPB 400 mg  Status:  Discontinued        400 mg 200 mL/hr over 60 Minutes Intravenous On call to O.R. 06/21/11 1032 06/21/11 1600   06/21/11 1032   metroNIDAZOLE (FLAGYL)  IVPB 500 mg  Status:  Discontinued        500 mg 100 mL/hr over 60 Minutes Intravenous On call to O.R. 06/21/11 1032 06/21/11 1600          Assessment/Plan: s/p Procedure(s): ILEO LOOP COLOSTOMY-seems to be doing appropriate postop. Advance diet, heplock IV  LOS: 2 days    Lodema Pilot DAVID 06/23/2011

## 2011-06-24 ENCOUNTER — Other Ambulatory Visit: Payer: Self-pay

## 2011-06-24 ENCOUNTER — Inpatient Hospital Stay (HOSPITAL_COMMUNITY): Payer: Self-pay

## 2011-06-24 LAB — CBC
HCT: 25.8 % — ABNORMAL LOW (ref 39.0–52.0)
Hemoglobin: 8.7 g/dL — ABNORMAL LOW (ref 13.0–17.0)
RBC: 3.03 MIL/uL — ABNORMAL LOW (ref 4.22–5.81)

## 2011-06-24 LAB — BLOOD GAS, ARTERIAL
Acid-Base Excess: 6.5 mmol/L — ABNORMAL HIGH (ref 0.0–2.0)
TCO2: 28.3 mmol/L (ref 0–100)
pCO2 arterial: 43.6 mmHg (ref 35.0–45.0)

## 2011-06-24 LAB — BASIC METABOLIC PANEL
BUN: 13 mg/dL (ref 6–23)
CO2: 33 mEq/L — ABNORMAL HIGH (ref 19–32)
Glucose, Bld: 115 mg/dL — ABNORMAL HIGH (ref 70–99)
Potassium: 3.7 mEq/L (ref 3.5–5.1)
Sodium: 137 mEq/L (ref 135–145)

## 2011-06-24 LAB — CARDIAC PANEL(CRET KIN+CKTOT+MB+TROPI)
Relative Index: 1.7 (ref 0.0–2.5)
Troponin I: <0.3 ng/mL (ref <0.30)

## 2011-06-24 MED ORDER — POTASSIUM CHLORIDE IN NACL 20-0.9 MEQ/L-% IV SOLN
INTRAVENOUS | Status: DC
  Administered 2011-06-24 – 2011-06-25 (×3): via INTRAVENOUS
  Administered 2011-06-25: 125 mL/h via INTRAVENOUS
  Administered 2011-06-26 (×3): via INTRAVENOUS
  Administered 2011-06-27: 125 mL/h via INTRAVENOUS
  Administered 2011-06-27 – 2011-06-30 (×6): via INTRAVENOUS
  Filled 2011-06-24 (×20): qty 1000

## 2011-06-24 MED ORDER — IOHEXOL 300 MG/ML  SOLN
100.0000 mL | Freq: Once | INTRAMUSCULAR | Status: AC | PRN
  Administered 2011-06-24: 100 mL via INTRAVENOUS

## 2011-06-24 MED ORDER — SODIUM CHLORIDE 0.9 % IV BOLUS (SEPSIS)
1000.0000 mL | Freq: Once | INTRAVENOUS | Status: AC
  Administered 2011-06-24: 1000 mL via INTRAVENOUS

## 2011-06-24 MED ORDER — LIDOCAINE VISCOUS 2 % MT SOLN
20.0000 mL | Freq: Once | OROMUCOSAL | Status: AC
  Administered 2011-06-24: 20 mL via OROMUCOSAL
  Filled 2011-06-24: qty 20

## 2011-06-24 NOTE — Progress Notes (Signed)
Pt resting quietly. Rated abd soreness at level 2. Denied H/A. Stated, "I feel the best I've felt in a while." NS bolus in progress as ordered. B/P 98/60 with HR 100. Dozing. No SOB or distress.

## 2011-06-24 NOTE — Progress Notes (Signed)
Report called to Delray Alt, RN.

## 2011-06-24 NOTE — Progress Notes (Signed)
Dr. Derrell Lolling aware via phone pt not feeling well. Running low bp's as low as 79/46 at 1400 with tachycardia -hr 113. Last bp 92/58 manually with hr 116.  Oxygen sats on RA after pt ambulated was 87%. Pt to bed and O2 at 2l/Saks applied --sats increased to 93%. Pt denied chest discomfort just abd soreness and improving headache. No SOB or signs of distress. See new orders received.

## 2011-06-24 NOTE — Progress Notes (Signed)
3 Days Post-Op  Subjective: Patient has had some increasing shortness of breath today. A little uncomfortable and difficult to catch his breath. Nonproductive cough. His abdomen has been distended and uncomfortable for the last 2 days. It has been assumed that he has an ileus.  Earlier workup today includes an EKG which shows a sinus tachycardia but no change otherwise and cardiac enzymes are negative.lab reveals WBC 4900, hemoglobin 8.7, being at normal.  Chest x-ray shows left lower lobe atelectasis versus pneumonia. Abdominal x-ray shows significant small bowel dilatation and some gas in the right colon. CT Angio chest shows no evidence of pulmonary embolism, but he has bibasilar atelectasis versus pneumonia, a nodular  nutmeg liver, some free abdominal air and some ascites.  I have transferred him to the ICU for closer monitoring. He will need an NG tube. I have asked critical care medicine to see him.  Objective: Vital signs in last 24 hours: Temp:  [97.9 F (36.6 C)-99.4 F (37.4 C)] 97.9 F (36.6 C) (12/23 2020) Pulse Rate:  [98-120] 112  (12/23 2020) Resp:  [17-20] 17  (12/23 2020) BP: (79-108)/(46-69) 94/55 mmHg (12/23 2020) SpO2:  [86 %-94 %] 86 % (12/23 2020) Last BM Date: 06/21/11  Intake/Output from previous day: 12/22 0701 - 12/23 0700 In: 840 [P.O.:840] Out: -  Intake/Output this shift: Total I/O In: -  Out: 100 [Urine:100]  General appearance: alert. Color pretty good on oxygen, mild distress. Resp: breath sounds are little bit distant. No wheezing. Significant decreased breath sounds at bases. GI: abdomen is distended and silent. Mild diffuse tenderness. Some guarding.Wounds looked okay.  Lab Results:   [facility] 06/24/11 1635 06/22/11 0353  WBC 4.9 8.7  HGB 8.7* 9.0*  HCT 25.8* 27.6*  PLT 223 274   BMET  Basename 06/24/11 1635  NA 137  K 3.7  CL 97  CO2 33*  GLUCOSE 115*  BUN 13  CREATININE 0.96  CALCIUM 8.5   PT/INR No results found for this  basename: LABPROT:2,INR:2 in the last 72 hours ABG No results found for this basename: PHART:2,PCO2:2,PO2:2,HCO3:2 in the last 72 hours  Studies/Results: Dg Chest Port 1 View  06/24/2011  *RADIOLOGY REPORT*  Clinical Data: Shortness of breath, oxygen desaturation  PORTABLE CHEST - 1 VIEW  Comparison: Portable exam 2055 hours compared to 06/21/2011  Findings: Normal heart size, mediastinal contours, and pulmonary vascularity. Emphysematous and chronic bronchitic changes. Decreased lung volumes versus previous exam. Left basilar atelectasis versus infiltrate. Minimal atelectasis right base. Upper lungs clear. No pleural effusion or pneumothorax. No acute osseous findings.  IMPRESSION: Emphysematous and bronchitic changes with mild right basilar atelectasis. Atelectasis versus infiltrate left lower lobe.  Original Report Authenticated By: Lollie Marrow, M.D.    Anti-infectives: Anti-infectives     Start     Dose/Rate Route Frequency Ordered Stop   06/21/11 1032   ciprofloxacin (CIPRO) IVPB 400 mg  Status:  Discontinued        400 mg 200 mL/hr over 60 Minutes Intravenous On call to O.R. 06/21/11 1032 06/21/11 1600   06/21/11 1032   metroNIDAZOLE (FLAGYL) IVPB 500 mg  Status:  Discontinued        500 mg 100 mL/hr over 60 Minutes Intravenous On call to O.R. 06/21/11 1032 06/21/11 1600          Assessment/Plan: s/p Procedure(s): ILEO LOOP COLOSTOMY   New onset shortness of breath. He has bi-basilar atelectasis versus pneumonia. This is almost certainly being exacerbated by his abdominal distention in a  patient with prior tobacco abuse and possible past history of sleep apnea.  Significant abdominal ileus, 3 days postop closure of loop ileostomy. We will need to be alert for abdominal complications. He will need an NG tube placed tonight.  There is no objective evidence that he has had a cardiac event.  I have contacted the physician from the critical care medicine service who will  evaluate the patient and they will follow him actively as a Research scientist (medical).  I will defer the decision as to whether he needs antibiotics for hospital-acquired pneumonia to them.  Question whether he may have liver disease.  Check blood gas tonight.  Repeat abdominal x-rays and lab work tomorrow.   LOS: 3 days    Annise Boran M 06/24/2011 10:01 PM

## 2011-06-24 NOTE — Progress Notes (Signed)
In to patients room to assess. Pt found sitting at side of bed and short of breath.  Vs taken-94/54, 86%2l via [place], 97.9,112,26.  Pt encouraged to use is and demonstrated proper use.  Pt dusky in color and abdomen distended with faint bs in all all quads, - flatus.  Pt coughing up clear, frothy sputum and states, "i cant take a deep breath."  Complains of pain and bloating in abdomen without relief from prior pain medications.  Notified Dr Derrell Lolling.  Orders given and request made for stepdown bed.  House supervisor Georgette Shell notified.  Bed assignment for 1229.  Notified patient of current plan of care.  X-ray on unit to do portable chest xray @ 2055.  O2 sat 89% on 3l via [place].

## 2011-06-24 NOTE — Progress Notes (Signed)
3 Days Post-Op  Subjective: Had some bloating yesterday and was relieved with belching.  Still no flatus or BM  Objective: Vital signs in last 24 hours: Temp:  [97.9 F (36.6 C)-98.7 F (37.1 C)] 97.9 F (36.6 C) (12/23 0500) Pulse Rate:  [80-118] 118  (12/23 0500) Resp:  [18-20] 20  (12/23 0500) BP: (108-121)/(69-74) 108/69 mmHg (12/23 0500) SpO2:  [90 %-95 %] 91 % (12/23 0500) Last BM Date: 06/21/11  Intake/Output from previous day: 12/22 0701 - 12/23 0700 In: 840 [P.O.:840] Out: -  Intake/Output this shift:    General appearance: alert, cooperative and no distress GI: abdomen soft, and minimal incisional tenderness, appropriate abdomen, slight distension??  Lab Results:   Basename 06/22/11 0353 06/21/11 1452  WBC 8.7 7.3  HGB 9.0* 10.8*  HCT 27.6* 33.3*  PLT 274 316   BMET  Basename 06/21/11 1452 06/21/11 1140  NA -- 141  K -- 3.4*  CL -- 109  CO2 -- 26  GLUCOSE -- 97  BUN -- 8  CREATININE 0.85 0.84  CALCIUM -- 9.4   PT/INR No results found for this basename: LABPROT:2,INR:2 in the last 72 hours ABG No results found for this basename: PHART:2,PCO2:2,PO2:2,HCO3:2 in the last 72 hours  Studies/Results: No results found.  Anti-infectives: Anti-infectives     Start     Dose/Rate Route Frequency Ordered Stop   06/21/11 1032   ciprofloxacin (CIPRO) IVPB 400 mg  Status:  Discontinued        400 mg 200 mL/hr over 60 Minutes Intravenous On call to O.R. 06/21/11 1032 06/21/11 1600   06/21/11 1032   metroNIDAZOLE (FLAGYL) IVPB 500 mg  Status:  Discontinued        500 mg 100 mL/hr over 60 Minutes Intravenous On call to O.R. 06/21/11 1032 06/21/11 1600          Assessment/Plan: s/p Procedure(s): ILEO LOOP COLOSTOMY will hold on advancing diet for now since he has had some bloating and belching and still has not shown any sign of return of bowel function.  LOS: 3 days    Lodema Pilot DAVID 06/24/2011

## 2011-06-25 ENCOUNTER — Inpatient Hospital Stay (HOSPITAL_COMMUNITY): Payer: Self-pay

## 2011-06-25 DIAGNOSIS — J9600 Acute respiratory failure, unspecified whether with hypoxia or hypercapnia: Secondary | ICD-10-CM

## 2011-06-25 DIAGNOSIS — R0902 Hypoxemia: Secondary | ICD-10-CM

## 2011-06-25 LAB — CBC
HCT: 27.2 % — ABNORMAL LOW (ref 39.0–52.0)
MCV: 85.3 fL (ref 78.0–100.0)
RBC: 3.19 MIL/uL — ABNORMAL LOW (ref 4.22–5.81)
RDW: 14.6 % (ref 11.5–15.5)
WBC: 5.5 10*3/uL (ref 4.0–10.5)

## 2011-06-25 LAB — COMPREHENSIVE METABOLIC PANEL
BUN: 15 mg/dL (ref 6–23)
CO2: 35 mEq/L — ABNORMAL HIGH (ref 19–32)
Calcium: 8.6 mg/dL (ref 8.4–10.5)
Creatinine, Ser: 0.89 mg/dL (ref 0.50–1.35)
GFR calc Af Amer: >90 mL/min (ref >90)
GFR calc non Af Amer: >90 mL/min (ref >90)
Glucose, Bld: 105 mg/dL — ABNORMAL HIGH (ref 70–99)

## 2011-06-25 LAB — DIFFERENTIAL
Eosinophils Relative: 0 % (ref 0–5)
Lymphocytes Relative: 8 % — ABNORMAL LOW (ref 12–46)
Lymphs Abs: 0.5 10*3/uL — ABNORMAL LOW (ref 0.7–4.0)
Monocytes Absolute: 0.5 10*3/uL (ref 0.1–1.0)

## 2011-06-25 MED ORDER — ALBUTEROL SULFATE (5 MG/ML) 0.5% IN NEBU
2.5000 mg | INHALATION_SOLUTION | Freq: Four times a day (QID) | RESPIRATORY_TRACT | Status: DC
  Administered 2011-06-25 – 2011-06-28 (×13): 2.5 mg via RESPIRATORY_TRACT
  Filled 2011-06-25 (×12): qty 0.5

## 2011-06-25 MED ORDER — VITAMINS A & D EX OINT
TOPICAL_OINTMENT | CUTANEOUS | Status: AC
  Filled 2011-06-25: qty 5

## 2011-06-25 NOTE — Consult Note (Signed)
  Patient name: Scott Berry Medical record number: [id number] Date of birth: [birth year] Age: 50 y.o. 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 Post-op course complicated by hypoxia, PCCM consutled 12/24. CTA which was neg for PE, but showed massively dilated stomach.  NG placed, drained 1L fluid immediately.  Patient feels much better than before, but remains mildly hypoxic.  CTA with mild atelectasis.  Lines/tubes  Culture data/sepsis markers 12/20 MRSA PCR>>>neg  Antibiotics 12/20 Cipro (OR) x1 12/20 Flagyl (OR) x1  Best practice DVT: SCD's GI: not indicated   Events/studies  SUBJECTIVE:  "feels like my stomach is full", no other acute c/o's  Temp:  [97.9 F (36.6 C)-99.4 F (37.4 C)] 98.3 F (36.8 C) (12/24 0800) Pulse Rate:  [90-120] 95  (12/24 0400) Resp:  [10-22] 19  (12/24 0200) BP: (79-123)/(46-73) 111/52 mmHg (12/24 0400) SpO2:  [86 %-95 %] 95 % (12/24 0858)    Intake/Output Summary (Last 24 hours) at 06/25/11 1118 Last data filed at 06/25/11 0844  Gross per 24 hour  Intake 2671.08 ml  Output   3775 ml  Net -1103.92 ml   Physical exam General: WDWN adult male in NAD ENT: Oropharynx clear. Moist mucous membranes. No thrush Heart: Normal S1, S2. No murmurs, rubs, or gallops appreciated. No bruits, equal pulses. Lungs: Decreased breath sounds in the bilateral bases. Abdomen: Abdomen soft, mildly appropriately tender and not distended, normoactive bowel sounds. No hepatosplenomegaly or masses. Skin: No rashes or lesions Neuro: AAOx4, MAE  radiology 12/24 CXR>>>mild infrahilar atx L>R 12/24 KUB>>>Persistent distal small bowel obstruction or small bowel ileus, perhaps slightly improved after nasogastric tube  placement.  Free intraperitoneal gas, presumably postoperative.   LAB RESULT  Lab 06/25/11 0320 06/24/11 1635 06/21/11 1452 06/21/11 1140  NA 140 137 -- 141  K 3.9 3.7 -- 3.4*  CL 98 97 -- 109  CO2 35* 33* -- 26  BUN 15 13 -- 8  CREATININE 0.89 0.96 0.85 --  GLUCOSE 105* 115* -- 97    Lab 06/25/11 0320 06/24/11 1635 06/22/11 0353  HGB 9.1* 8.7* 9.0*  HCT 27.2* 25.8* 27.6*  WBC 5.5 4.9 8.7  PLT 253 223 274      Assessment and Plan   Perforation of colon - s/p Sigmoid colectomy / diverting ileostomy 12/20 Assessment: s/p takedown of ileo-loop colostomy.  KUB with ileus Plan: -Rec's per CCS -NGT for decompression of abd  Hypoxia Assessment: s/p colostomy takedown.  Suspect the majority of this is due to atelectasis and splinting.  On 4L/[place], no acute distress with O2 sats 93%.  Continued smoker.  CXR without overt edema/infiltrate.  No evidence of bronchospasm on exam.  Plan: -O2 to keep sats >92% -aggressive pulmonary hygiene -mobilize when able  Hypertension Assessment: on imdur at home Plan: -continue home medications as ordered.    Sandrea Hughs, MD Pulmonary and Critical Care Medicine [facility] Cell [phone]

## 2011-06-25 NOTE — Progress Notes (Signed)
4 Days Post-Op  Subjective: States that his abdominal distension and abdominal pain are improving, no flatus or BM  Objective: Vital signs in last 24 hours: Temp:  [97.9 F (36.6 C)-99.4 F (37.4 C)] 98.3 F (36.8 C) (12/24 0800) Pulse Rate:  [90-120] 95  (12/24 0400) Resp:  [10-22] 19  (12/24 0200) BP: (79-123)/(46-73) 111/52 mmHg (12/24 0400) SpO2:  [86 %-94 %] 94 % (12/24 0400) Last BM Date: 06/21/11  Intake/Output from previous day: 12/23 0701 - 12/24 0700 In: 2641.1 [I.V.:1602.1; NG/GT:40; IV Piggyback:999] Out: 3150 [Urine:400; Emesis/NG output:2750] Intake/Output this shift: Total I/O In: -  Out: 625 [Urine:125; Emesis/NG output:500]  General appearance: alert, cooperative and no distress Head: Normocephalic, without obvious abnormality, atraumatic Resp: clear to auscultation bilaterally and but the lung sounds are decreased in the lower lungs, sats 90-92% on [place] Chest wall: no tenderness Cardio: normal rate 87, regular rhythm GI: soft, minimal incisional tenderness, wound without erythema, still has some distension which is slightly more than my exam yesterday but patient states that it is improved, no peritonitis, bilious output from NG Extremities: extremities normal, atraumatic, no cyanosis or edema Pulses: 2+ and symmetric Neurologic: Grossly normal  Lab Results:   Basename 06/25/11 0320 06/24/11 1635  WBC 5.5 4.9  HGB 9.1* 8.7*  HCT 27.2* 25.8*  PLT 253 223   BMET  Basename 06/25/11 0320 06/24/11 1635  NA 140 137  K 3.9 3.7  CL 98 97  CO2 35* 33*  GLUCOSE 105* 115*  BUN 15 13  CREATININE 0.89 0.96  CALCIUM 8.6 8.5   PT/INR No results found for this basename: LABPROT:2,INR:2 in the last 72 hours ABG  Basename 06/24/11 2220  PHART 7.460*  HCO3 30.6*    Studies/Results: Dg Abd 1 View  06/24/2011  *RADIOLOGY REPORT*  Clinical Data: Abdominal pain.  ABDOMEN - 1 VIEW  Comparison: Abdominal radiograph performed 05/11/2011, and CT of the  abdomen and pelvis performed 03/26/2011  Findings: There is diffuse distension of small bowel loops to 5.0 cm in maximal diameter.  However, residual air and contrast is noted within the cecum and ascending colon.  Given the lack of large bowel dilatation, findings raise concern for small bowel obstruction.  No free intra-abdominal air is identified, though evaluation for free air is suboptimal on a supine view.  Contrast is noted filling the renal calyces.  Clips are noted at the lower abdomen.  No acute osseous abnormalities are seen.  The visualized lung bases are grossly clear.  Scattered vascular calcifications are seen.  IMPRESSION:  1.  Diffuse distension of small bowel loops to 5.0 cm in maximal diameter, raising concern for small bowel obstruction.  No free intra-abdominal air seen. 2.  Scattered vascular calcifications noted.  Findings were discussed with Juliette Alcide RN in the [facility] at 10:16 p.m. on 06/24/2011.  Original Report Authenticated By: Tonia Ghent, M.D.   Ct Angio Chest W/cm &/or Wo Cm  06/24/2011  *RADIOLOGY REPORT*  Clinical Data:  Worsening shortness of breath and weakness; decreasing O2 saturation on oxygen.  Status post ileostomy 3 days ago.  CT ANGIOGRAPHY CHEST WITH CONTRAST  Technique:  Multidetector CT imaging of the chest was performed using the standard protocol during bolus administration of intravenous contrast.  Multiplanar CT image reconstructions including MIPs were obtained to evaluate the vascular anatomy.  Contrast: OMNIPAQUE IOHEXOL 300 MG/ML IV SOLN  Comparison:  Chest radiograph performed earlier today at 08:55 p.m.  Findings:  There is no evidence of  significant pulmonary embolus. Evaluation for pulmonary embolus is suboptimal in areas of airspace consolidation.  There is partial consolidation of both lower lung lobes; the appearance may reflect postoperative atelectasis, though pneumonia cannot be entirely excluded.  Minimal blebs are noted at the lung  apices.  There is no evidence of pleural effusion or pneumothorax. No masses are identified; no abnormal focal contrast enhancement is seen.  Diffuse coronary artery calcifications are noted.  Trace pericardial fluid remains within normal limits.  The mediastinum is otherwise unremarkable in appearance.  Scattered mediastinal nodes remain subcentimeter in size.  The great vessels demonstrate mild calcific atherosclerotic disease but are otherwise grossly unremarkable.  No axillary lymphadenopathy is seen.  The thyroid gland is unremarkable in appearance.  Small to moderate volume abdominal ascites is partially imaged. This is at the upper limits of normal for simple fluid attenuation, and may simply be postoperative in nature.  Significant free intra- abdominal air is also seen; this may also simply be postoperative. The stomach is significantly distended with fluid and air.  There is a heterogeneous appearance to the liver, raising suspicion for mild nutmeg liver, due to underlying passive venous congestion. The minimally visualized portions of the spleen are grossly unremarkable.  No acute osseous abnormalities are seen.  Minimal degenerative change is noted at the lower cervical spine.  Review of the MIP images confirms the above findings.  IMPRESSION:  1.  No evidence for significant pulmonary embolus.  2.  Partial consolidation of both lower lung lobes may reflect postoperative atelectasis, though pneumonia cannot be entirely excluded. 3.  Minimal blebs noted at the lung apices. 4.  Diffuse coronary artery calcifications seen. 5.  Small to moderate volume abdominal ascites may be postoperative in nature. 6.  Significant free intra-abdominal air also seen; given that the patient is 3 days status post ileostomy, this may also simply be postoperative.  Per clinical correlation, there are no definite symptoms to suggest perforated viscus. 7.  Heterogeneous appearance of the liver raises suspicion for mild nutmeg  liver, due to underlying passive venous congestion. 8.  Stomach significantly distended with fluid and air.  Findings were discussed with Dr. Claud Kelp at 09:32 p.m. on 06/24/2011.  Original Report Authenticated By: Tonia Ghent, M.D.   Dg Chest Port 1 View  06/24/2011  *RADIOLOGY REPORT*  Clinical Data: Shortness of breath, oxygen desaturation  PORTABLE CHEST - 1 VIEW  Comparison: Portable exam 2055 hours compared to 06/21/2011  Findings: Normal heart size, mediastinal contours, and pulmonary vascularity. Emphysematous and chronic bronchitic changes. Decreased lung volumes versus previous exam. Left basilar atelectasis versus infiltrate. Minimal atelectasis right base. Upper lungs clear. No pleural effusion or pneumothorax. No acute osseous findings.  IMPRESSION: Emphysematous and bronchitic changes with mild right basilar atelectasis. Atelectasis versus infiltrate left lower lobe.  Original Report Authenticated By: Lollie Marrow, M.D.    Anti-infectives: Anti-infectives     Start     Dose/Rate Route Frequency Ordered Stop   06/21/11 1032   ciprofloxacin (CIPRO) IVPB 400 mg  Status:  Discontinued        400 mg 200 mL/hr over 60 Minutes Intravenous On call to O.R. 06/21/11 1032 06/21/11 1600   06/21/11 1032   metroNIDAZOLE (FLAGYL) IVPB 500 mg  Status:  Discontinued        500 mg 100 mL/hr over 60 Minutes Intravenous On call to O.R. 06/21/11 1032 06/21/11 1600          Assessment/Plan: s/p Procedure(s): ILEO LOOP COLOSTOMY Low suspicion  for leak due to improving abdominal discomfort, afebrile and wbc down.  This is likely posotp ileus or anastomotic stricture/edema or early postop bowel obstruction.  Most likely ileus as he has very scant bowel sounds and no sign of return of bowel function.  Will keep NPO and NG tube for now.  His oxygenation issues may be due to the abdominal distension as well vs. primary pulmonary problem.  Will continue aggressive pulmonary toilet and change  nebulizers to scheduled.  LOS: 4 days    Lodema Pilot DAVID 06/25/2011

## 2011-06-25 NOTE — Consult Note (Signed)
Patient name: Scott Berry Medical record number: [id number] Date of birth: 03-31-1961 Age: 50 y.o. 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  He became hypoxic and we were consulted.  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  He became hypoxic and we were consulted.  Patient had CTA which was neg for PE, but showed massively dilated stomach.  NG placed, drained 1L fluid immediately.  Patient feels much better than before, but remains mildly hypoxic.  CTA did show some atelectasis.  Past Medical History  Diagnosis Date  . Hypertension   . Sleep apnea 1995    not a problem now  . Shortness of breath     if don't take medicines  . Coronary artery arteriosclerosis     Past Surgical History  Procedure Date  . Foreign body removal rectal Oct 2011  . Colon surgery [id number]    colectomy/diverting ileostomy - colon perf  . Cardiac catheterization 09/10/2009    Results in chart    History reviewed. No pertinent family history.  Social History:  reports that he has been smoking Cigarettes.  He has been smoking about .5 packs per day. He does not have any smokeless tobacco history on file. He reports that he does not drink alcohol or use illicit drugs.  Allergies:  Allergies  Allergen Reactions  . Penicillins Anaphylaxis    Medications:  Prior to Admission medications   Medication Sig Start Date End Date Taking? Authorizing Provider  ibuprofen (ADVIL,MOTRIN) 200 MG  tablet Take 600 mg by mouth every 8 (eight) hours as needed. Pain    Yes Historical Provider, MD  isosorbide mononitrate (IMDUR) 30 MG 24 hr tablet Take 30 mg by mouth 2 (two) times daily.     Yes Historical Provider, MD  oxyCODONE (OXY IR/ROXICODONE) 5 MG immediate release tablet Take 1-2 tablets (5-10 mg total) by mouth every 4 (four) hours as needed for pain. 06/21/11 07/01/11  Ardeth Sportsman, MD    A comprehensive review of systems was negative.  Temp:  [97.9 F (36.6 C)-99.4 F (37.4 C)] 99.2 F (37.3 C) (12/24 0000) Pulse Rate:  [90-120] 90  (12/24 0000) Resp:  [10-22] 10  (12/24 0000) BP: (79-123)/(46-69) 99/61 mmHg (12/24 0000) SpO2:  [86 %-94 %] 93 % (12/24 0000)    Intake/Output Summary (Last 24 hours) at 06/25/11 0252 Last data filed at 06/25/11 0000  Gross per 24 hour  Intake 1746.08 ml  Output   1300 ml  Net 446.08 ml   Physical exam General: No apparent distress. Eyes: Anicteric sclerae. ENT: Oropharynx clear. Moist mucous membranes. No thrush Lymph: No cervical, supraclavicular, or axillary lymphadenopathy. Heart: Normal S1, S2. No murmurs, rubs, or gallops appreciated. No bruits, equal pulses. Lungs: Decreased breath sounds in the bilateral bases. Abdomen: Abdomen soft, mildly appropriately tender and not distended, normoactive bowel sounds. No hepatosplenomegaly or masses. Musculoskeletal: No clubbing or synovitis. Skin: No rashes or lesions Neuro: No focal neurologic deficits.  radiology    LAB RESULT Lab Results  Component Value Date   CREATININE 0.96 06/24/2011  BUN 13 06/24/2011   NA 137 06/24/2011   K 3.7 06/24/2011   CL 97 06/24/2011   CO2 33* 06/24/2011   Lab Results  Component Value Date   WBC 4.9 06/24/2011   HGB 8.7* 06/24/2011   HCT 25.8* 06/24/2011   MCV 85.1 06/24/2011   PLT 223 06/24/2011   Lab Results  Component Value Date   ALT 8 06/21/2011   AST 11 06/21/2011   ALKPHOS 60 06/21/2011   BILITOT 0.3 06/21/2011   Lab  Results  Component Value Date   INR 1.03 12/14/2010     Assessment and Plan  Principal Problem:  *Perforation of colon - s/p Sigmoid colectomy / diverting ileostomy Active Problems:  Hypertension  Hypoxia -Suspect the majority of this is due to atelectasis and splinting.  He is only on 2L [place], and he is reportedly significantly improved since earlier.  Ileus was likely worsening picture.  Kacy Hegna J 06/25/2011, 2:52 AM

## 2011-06-26 ENCOUNTER — Inpatient Hospital Stay (HOSPITAL_COMMUNITY): Payer: Self-pay

## 2011-06-26 DIAGNOSIS — J9819 Other pulmonary collapse: Secondary | ICD-10-CM

## 2011-06-26 DIAGNOSIS — R0902 Hypoxemia: Secondary | ICD-10-CM

## 2011-06-26 DIAGNOSIS — K567 Ileus, unspecified: Secondary | ICD-10-CM | POA: Diagnosis not present

## 2011-06-26 LAB — COMPREHENSIVE METABOLIC PANEL
ALT: 7 U/L (ref 0–53)
AST: 15 U/L (ref 0–37)
Albumin: 2.7 g/dL — ABNORMAL LOW (ref 3.5–5.2)
CO2: 35 mEq/L — ABNORMAL HIGH (ref 19–32)
Calcium: 8.5 mg/dL (ref 8.4–10.5)
Chloride: 100 mEq/L (ref 96–112)
GFR calc non Af Amer: >90 mL/min (ref >90)
Sodium: 142 mEq/L (ref 135–145)

## 2011-06-26 LAB — DIFFERENTIAL
Basophils Absolute: 0 10*3/uL (ref 0.0–0.1)
Eosinophils Relative: 0 % (ref 0–5)
Monocytes Absolute: 0.7 10*3/uL (ref 0.1–1.0)
Monocytes Relative: 9 % (ref 3–12)
Neutrophils Relative %: 81 % — ABNORMAL HIGH (ref 43–77)
WBC Morphology: INCREASED

## 2011-06-26 LAB — CBC
Platelets: 247 10*3/uL (ref 150–400)
RBC: 3.16 MIL/uL — ABNORMAL LOW (ref 4.22–5.81)
WBC: 8.2 10*3/uL (ref 4.0–10.5)

## 2011-06-26 MED ORDER — HYDROMORPHONE HCL PF 1 MG/ML IJ SOLN
0.5000 mg | INTRAMUSCULAR | Status: DC | PRN
  Administered 2011-06-26 – 2011-06-28 (×19): 1 mg via INTRAVENOUS
  Filled 2011-06-26 (×20): qty 1

## 2011-06-26 MED ORDER — NITROGLYCERIN 2 % TD OINT
1.0000 [in_us] | TOPICAL_OINTMENT | Freq: Four times a day (QID) | TRANSDERMAL | Status: DC
  Administered 2011-06-26 – 2011-07-01 (×21): 1 [in_us] via TOPICAL
  Filled 2011-06-26: qty 30

## 2011-06-26 NOTE — Progress Notes (Signed)
5 Days Post-Op  Subjective: Has started to pass some gas.  Some intermittent epigastric pains.  SpO2 100% while I was in the room.  Objective: Vital signs in last 24 hours: Temp:  [97.7 F (36.5 C)-99 F (37.2 C)] 97.7 F (36.5 C) (12/25 0331) Pulse Rate:  [90-98] 92  (12/25 0331) Resp:  [9-20] 9  (12/24 2000) BP: (109-144)/(67-80) 134/75 mmHg (12/25 0331) SpO2:  [88 %-95 %] 92 % (12/25 0213) Last BM Date: 06/21/11  Intake/Output from previous day: 12/24 0701 - 12/25 0700 In: 1833 [P.O.:150; I.V.:1500; NG/GT:180] Out: 3375 [Urine:975; Emesis/NG output:2400] Intake/Output this shift:    PE: Lungs-decreased BS in base Abd-soft, some distension, active BS, incision c/d/i  Lab Results:   [facility] 06/26/11 06/25/11 0320  WBC 8.2 5.5  HGB 9.1* 9.1*  HCT 27.5* 27.2*  PLT 247 253   BMET  Basename 06/26/11 06/25/11 0320  NA 142 140  K 3.8 3.9  CL 100 98  CO2 35* 35*  GLUCOSE 91 105*  BUN 17 15  CREATININE 0.80 0.89  CALCIUM 8.5 8.6   PT/INR No results found for this basename: LABPROT:2,INR:2 in the last 72 hours Comprehensive Metabolic Panel:    Component Value Date/Time   NA 142 06/26/2011 0000   K 3.8 06/26/2011 0000   CL 100 06/26/2011 0000   CO2 35* 06/26/2011 0000   BUN 17 06/26/2011 0000   CREATININE 0.80 06/26/2011 0000   GLUCOSE 91 06/26/2011 0000   CALCIUM 8.5 06/26/2011 0000   AST 15 06/26/2011 0000   ALT 7 06/26/2011 0000   ALKPHOS 42 06/26/2011 0000   BILITOT 0.4 06/26/2011 0000   PROT 5.9* 06/26/2011 0000   ALBUMIN 2.7* 06/26/2011 0000     Studies/Results: Dg Chest 2 View  06/25/2011  *RADIOLOGY REPORT*  Clinical Data: Cough and congestion  CHEST - 2 VIEW  Comparison:   the previous day's study  Findings: Nasogastric tube has been placed to the stomach.  Free intraperitoneal air is suggested, presumably postoperative. Dilated small bowel loops are noted in the upper abdomen.  Some worsening of infrahilar atelectasis/consolidation, left  greater than right.  Cannot exclude small left pleural effusion.  Heart size remains normal.  IMPRESSION:  1.  Worsening infrahilar consolidation / atelectasis, left greater than right. 2.  Probable free intraperitoneal gas, presumably postoperative.  Original Report Authenticated By: Osa Craver, M.D.   Dg Abd 1 View  06/24/2011  *RADIOLOGY REPORT*  Clinical Data: Abdominal pain.  ABDOMEN - 1 VIEW  Comparison: Abdominal radiograph performed 05/11/2011, and CT of the abdomen and pelvis performed 03/26/2011  Findings: There is diffuse distension of small bowel loops to 5.0 cm in maximal diameter.  However, residual air and contrast is noted within the cecum and ascending colon.  Given the lack of large bowel dilatation, findings raise concern for small bowel obstruction.  No free intra-abdominal air is identified, though evaluation for free air is suboptimal on a supine view.  Contrast is noted filling the renal calyces.  Clips are noted at the lower abdomen.  No acute osseous abnormalities are seen.  The visualized lung bases are grossly clear.  Scattered vascular calcifications are seen.  IMPRESSION:  1.  Diffuse distension of small bowel loops to 5.0 cm in maximal diameter, raising concern for small bowel obstruction.  No free intra-abdominal air seen. 2.  Scattered vascular calcifications noted.  Findings were discussed with Juliette Alcide RN in the [facility] ICU at 10:16 p.m. on 06/24/2011.  Original Report  Authenticated By: Tonia Ghent, M.D.   Ct Angio Chest W/cm &/or Wo Cm  06/24/2011  *RADIOLOGY REPORT*  Clinical Data:  Worsening shortness of breath and weakness; decreasing O2 saturation on oxygen.  Status post ileostomy 3 days ago.  CT ANGIOGRAPHY CHEST WITH CONTRAST  Technique:  Multidetector CT imaging of the chest was performed using the standard protocol during bolus administration of intravenous contrast.  Multiplanar CT image reconstructions including MIPs were obtained to evaluate the  vascular anatomy.  Contrast: OMNIPAQUE IOHEXOL 300 MG/ML IV SOLN  Comparison:  Chest radiograph performed earlier today at 08:55 p.m.  Findings:  There is no evidence of significant pulmonary embolus. Evaluation for pulmonary embolus is suboptimal in areas of airspace consolidation.  There is partial consolidation of both lower lung lobes; the appearance may reflect postoperative atelectasis, though pneumonia cannot be entirely excluded.  Minimal blebs are noted at the lung apices.  There is no evidence of pleural effusion or pneumothorax. No masses are identified; no abnormal focal contrast enhancement is seen.  Diffuse coronary artery calcifications are noted.  Trace pericardial fluid remains within normal limits.  The mediastinum is otherwise unremarkable in appearance.  Scattered mediastinal nodes remain subcentimeter in size.  The great vessels demonstrate mild calcific atherosclerotic disease but are otherwise grossly unremarkable.  No axillary lymphadenopathy is seen.  The thyroid gland is unremarkable in appearance.  Small to moderate volume abdominal ascites is partially imaged. This is at the upper limits of normal for simple fluid attenuation, and may simply be postoperative in nature.  Significant free intra- abdominal air is also seen; this may also simply be postoperative. The stomach is significantly distended with fluid and air.  There is a heterogeneous appearance to the liver, raising suspicion for mild nutmeg liver, due to underlying passive venous congestion. The minimally visualized portions of the spleen are grossly unremarkable.  No acute osseous abnormalities are seen.  Minimal degenerative change is noted at the lower cervical spine.  Review of the MIP images confirms the above findings.  IMPRESSION:  1.  No evidence for significant pulmonary embolus.  2.  Partial consolidation of both lower lung lobes may reflect postoperative atelectasis, though pneumonia cannot be entirely excluded.  3.  Minimal blebs noted at the lung apices. 4.  Diffuse coronary artery calcifications seen. 5.  Small to moderate volume abdominal ascites may be postoperative in nature. 6.  Significant free intra-abdominal air also seen; given that the patient is 3 days status post ileostomy, this may also simply be postoperative.  Per clinical correlation, there are no definite symptoms to suggest perforated viscus. 7.  Heterogeneous appearance of the liver raises suspicion for mild nutmeg liver, due to underlying passive venous congestion. 8.  Stomach significantly distended with fluid and air.  Findings were discussed with Dr. Claud Kelp at 09:32 p.m. on 06/24/2011.  Original Report Authenticated By: Tonia Ghent, M.D.   Dg Chest Port 1 View  06/24/2011  *RADIOLOGY REPORT*  Clinical Data: Shortness of breath, oxygen desaturation  PORTABLE CHEST - 1 VIEW  Comparison: Portable exam 2055 hours compared to 06/21/2011  Findings: Normal heart size, mediastinal contours, and pulmonary vascularity. Emphysematous and chronic bronchitic changes. Decreased lung volumes versus previous exam. Left basilar atelectasis versus infiltrate. Minimal atelectasis right base. Upper lungs clear. No pleural effusion or pneumothorax. No acute osseous findings.  IMPRESSION: Emphysematous and bronchitic changes with mild right basilar atelectasis. Atelectasis versus infiltrate left lower lobe.  Original Report Authenticated By: Lollie Marrow, M.D.  Dg Abd 2 Views  06/25/2011  *RADIOLOGY REPORT*  Clinical Data: Ileus, postop  ABDOMEN - 2 VIEW  Comparison:     the previous day's study  Findings: A nasogastric tube has been placed into the decompressed stomach.  Multiple dilated small bowel loops are still evident, perhaps slightly decreased in number.  There is free intraperitoneal air evident on the left lateral decubitus radiograph.  The colon appears decompressed with residual contrast proximally.  There is also residual contrast in the  urinary bladder.  Patchy iliofemoral vascular calcifications.  Regional bones unremarkable.  Vascular clips in the mid abdomen.  IMPRESSION:  1.  Persistent distal small bowel obstruction or small bowel ileus, perhaps slightly improved after nasogastric tube placement. 2.  Free intraperitoneal gas, presumably postoperative.  Original Report Authenticated By: Osa Craver, M.D.   Dg Abd Acute W/chest  06/26/2011  *RADIOLOGY REPORT*  Clinical Data: Abdominal distention  ACUTE ABDOMEN SERIES (ABDOMEN 2 VIEW & CHEST 1 VIEW)  Comparison: Chest x-ray of 06/25/2011 and abdomen films of the same date  Findings: There has been an increase in basilar opacities consistent with atelectasis or pneumonia left greater than right. No definite effusion is seen.  An NG tube is present with the tip extending below the diaphragm.  Supine and left lateral decubitus films of the abdomen show persistent small bowel obstruction with gaseous distention of small bowel loops.  There is free intraperitoneal air present, as described yesterday.  Contrast is noted within the colon which is decompressed.  The NG tube is within the distal body of the stomach.  IMPRESSION:  1.  Free intraperitoneal air as noted on yesterday's plain films. 2.  Persistent partial small bowel obstruction. 3.  Increase in basilar opacities consistent with atelectasis or pneumonia.  Original Report Authenticated By: Juline Patch, M.D.    Anti-infectives: Anti-infectives     Start     Dose/Rate Route Frequency Ordered Stop   06/21/11 1032   ciprofloxacin (CIPRO) IVPB 400 mg  Status:  Discontinued        400 mg 200 mL/hr over 60 Minutes Intravenous On call to O.R. 06/21/11 1032 06/21/11 1600   06/21/11 1032   metroNIDAZOLE (FLAGYL) IVPB 500 mg  Status:  Discontinued        500 mg 100 mL/hr over 60 Minutes Intravenous On call to O.R. 06/21/11 1032 06/21/11 1600          Assessment Principal Problem:  *Perforation of colon - s/p Sigmoid  colectomy / diverting ileostomy-POD#5 ileostomy closure Active Problems:  Hypertension  Postoperative ileus-bowel function starting to improve  Hypoxia-due to atelectasis    LOS: 5 days   Plan: OOB to chair. If ng output is down over the next 24 hours and his bowel function improves, can d/c ng tube.   Linden Mikes J 06/26/2011

## 2011-06-26 NOTE — Progress Notes (Signed)
  Patient name: Scott Berry Medical record number: [id number] Date of birth: December 23, 1960 Age: 50 y.o. 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 Post-op course complicated by hypoxia, PCCM consutled 12/24. CTA which was neg for PE, but showed massively dilated stomach.  NG placed, drained 1L fluid immediately.  Patient feels much better than before, but remains mildly hypoxic.  CTA with mild atelectasis.  Lines/tubes  Culture data/sepsis markers 12/20 MRSA PCR>>>neg  Antibiotics 12/20 Cipro (OR) x1 12/20 Flagyl (OR) x1  Best practice DVT: SCD's GI: not indicated   Events/studies  SUBJECTIVE:   Breathing better, not much cough.  Denies chest pain.  Temp:  [97.5 F (36.4 C)-99 F (37.2 C)] 97.5 F (36.4 C) (12/25 0800) Pulse Rate:  [92-98] 94  (12/25 0800) Resp:  [9-22] 22  (12/25 0800) BP: (109-139)/(67-80) 139/70 mmHg (12/25 0800) SpO2:  [88 %-98 %] 98 % (12/25 0840)    Intake/Output Summary (Last 24 hours) at 06/26/11 1056 Last data filed at 06/26/11 0900  Gross per 24 hour  Intake   2177 ml  Output   3000 ml  Net   -823 ml   Physical exam General: WDWN adult male in NAD ENT: Oropharynx clear. Moist mucous membranes. No thrush Heart: Normal S1, S2. No murmurs, rubs, or gallops appreciated. No bruits, equal pulses. Lungs: Decreased breath sounds in the bilateral bases. Abdomen: Abdomen soft, mildly appropriately tender and not distended, normoactive bowel sounds. No hepatosplenomegaly or masses. Skin: No rashes or lesions Neuro: AAOx4, MAE  radiology 12/25 CXR>>>atx L>R   LAB RESULT  Lab 06/26/11 06/25/11 0320 06/24/11 1635  NA 142 140 137  K 3.8 3.9 3.7  CL 100 98 97  CO2 35* 35* 33*  BUN 17 15 13     CREATININE 0.80 0.89 0.96  GLUCOSE 91 105* 115*    Lab 06/26/11 06/25/11 0320 06/24/11 1635  HGB 9.1* 9.1* 8.7*  HCT 27.5* 27.2* 25.8*  WBC 8.2 5.5 4.9  PLT 247 253 223      Assessment and Plan   Perforation of colon - s/p Sigmoid colectomy / diverting ileostomy 12/20 Assessment: s/p takedown of ileo-loop colostomy.  KUB with ileus Plan: -Rec's per CCS -NGT for decompression of abd  Hypoxia Assessment: s/p colostomy takedown.  Suspect the majority of this is due to atelectasis and splinting.  Continued smoker. Plan: -O2 to keep sats >92% -aggressive pulmonary hygiene -mobilize when able  Hypertension Assessment: on imdur at home Plan: -continue home medications as ordered.   Singleton Hickox, MD 06/26/2011, 10:56 AM Pager:  [phone]

## 2011-06-27 ENCOUNTER — Encounter (HOSPITAL_COMMUNITY): Payer: Self-pay | Admitting: Surgery

## 2011-06-27 DIAGNOSIS — J9811 Atelectasis: Secondary | ICD-10-CM | POA: Diagnosis not present

## 2011-06-27 DIAGNOSIS — R0902 Hypoxemia: Secondary | ICD-10-CM | POA: Diagnosis not present

## 2011-06-27 LAB — BASIC METABOLIC PANEL
CO2: 30 mEq/L (ref 19–32)
Calcium: 8.4 mg/dL (ref 8.4–10.5)
GFR calc non Af Amer: >90 mL/min (ref >90)
Sodium: 142 mEq/L (ref 135–145)

## 2011-06-27 NOTE — Plan of Care (Signed)
Problem: Phase II Progression Outcomes Goal: Sutures/staples intact Outcome: Not Applicable Date Met:  06/27/11 Approximated wound edges held, dermabond used per report received on patient

## 2011-06-27 NOTE — Progress Notes (Signed)
6 Days Post-Op  Subjective: Feels better today - passing gas and had a small BM. Pain well controlled  Objective: Vital signs in last 24 hours: Temp:  [97.5 F (36.4 C)-99.4 F (37.4 C)] 98.9 F (37.2 C) (12/26 0400) Pulse Rate:  [78-98] 78  (12/26 0400) Resp:  [11-22] 11  (12/26 0400) BP: (133-142)/(49-81) 133/67 mmHg (12/26 0400) SpO2:  [93 %-100 %] 93 % (12/26 0400) Last BM Date: 06/21/11  Intake/Output from previous day: 12/25 0701 - 12/26 0700 In: 3180 [I.V.:3000; NG/GT:180] Out: 2320 [Urine:1620; Emesis/NG output:700] Intake/Output this shift:    General appearance: alert and no distress Resp: clear to auscultation bilaterally Cardio: regular rate and rhythm, S1, S2 normal, no murmur, click, rub or gallop GI: Not distended today - soft not tender BS + Incision/Wound:Clean and dry  Lab Results:   [facility] 06/26/11 06/25/11 0320  WBC 8.2 5.5  HGB 9.1* 9.1*  HCT 27.5* 27.2*  PLT 247 253   BMET  Basename 06/27/11 0305 06/26/11  NA 142 142  K 3.5 3.8  CL 102 100  CO2 30 35*  GLUCOSE 82 91  BUN 11 17  CREATININE 0.68 0.80  CALCIUM 8.4 8.5   PT/INR No results found for this basename: LABPROT:2,INR:2 in the last 72 hours ABG  Basename 06/24/11 2220  PHART 7.460*  HCO3 30.6*    Studies/Results: Dg Chest 2 View  06/25/2011  *RADIOLOGY REPORT*  Clinical Data: Cough and congestion  CHEST - 2 VIEW  Comparison:   the previous day's study  Findings: Nasogastric tube has been placed to the stomach.  Free intraperitoneal air is suggested, presumably postoperative. Dilated small bowel loops are noted in the upper abdomen.  Some worsening of infrahilar atelectasis/consolidation, left greater than right.  Cannot exclude small left pleural effusion.  Heart size remains normal.  IMPRESSION:  1.  Worsening infrahilar consolidation / atelectasis, left greater than right. 2.  Probable free intraperitoneal gas, presumably postoperative.  Original Report Authenticated By: Osa Craver, M.D.   Dg Abd 2 Views  06/25/2011  *RADIOLOGY REPORT*  Clinical Data: Ileus, postop  ABDOMEN - 2 VIEW  Comparison:     the previous day's study  Findings: A nasogastric tube has been placed into the decompressed stomach.  Multiple dilated small bowel loops are still evident, perhaps slightly decreased in number.  There is free intraperitoneal air evident on the left lateral decubitus radiograph.  The colon appears decompressed with residual contrast proximally.  There is also residual contrast in the urinary bladder.  Patchy iliofemoral vascular calcifications.  Regional bones unremarkable.  Vascular clips in the mid abdomen.  IMPRESSION:  1.  Persistent distal small bowel obstruction or small bowel ileus, perhaps slightly improved after nasogastric tube placement. 2.  Free intraperitoneal gas, presumably postoperative.  Original Report Authenticated By: Osa Craver, M.D.   Dg Abd Acute W/chest  06/26/2011  *RADIOLOGY REPORT*  Clinical Data: Abdominal distention  ACUTE ABDOMEN SERIES (ABDOMEN 2 VIEW & CHEST 1 VIEW)  Comparison: Chest x-ray of 06/25/2011 and abdomen films of the same date  Findings: There has been an increase in basilar opacities consistent with atelectasis or pneumonia left greater than right. No definite effusion is seen.  An NG tube is present with the tip extending below the diaphragm.  Supine and left lateral decubitus films of the abdomen show persistent small bowel obstruction with gaseous distention of small bowel loops.  There is free intraperitoneal air present, as described yesterday.  Contrast is  noted within the colon which is decompressed.  The NG tube is within the distal body of the stomach.  IMPRESSION:  1.  Free intraperitoneal air as noted on yesterday's plain films. 2.  Persistent partial small bowel obstruction. 3.  Increase in basilar opacities consistent with atelectasis or pneumonia.  Original Report Authenticated By: Juline Patch, M.D.      Anti-infectives: Anti-infectives     Start     Dose/Rate Route Frequency Ordered Stop   06/21/11 1032   ciprofloxacin (CIPRO) IVPB 400 mg  Status:  Discontinued        400 mg 200 mL/hr over 60 Minutes Intravenous On call to O.R. 06/21/11 1032 06/21/11 1600   06/21/11 1032   metroNIDAZOLE (FLAGYL) IVPB 500 mg  Status:  Discontinued        500 mg 100 mL/hr over 60 Minutes Intravenous On call to O.R. 06/21/11 1032 06/21/11 1600          Assessment/Plan: s/p Procedure(s): ILEO LOOP COLOSTOMY Ileus seems to be resolving. No respiratory distress this AM. Plan: Will clamp N/G  LOS: 6 days    Scott Berry 06/27/2011

## 2011-06-27 NOTE — Progress Notes (Signed)
  Patient name: Scott Berry Medical record number: [id number] Date of birth: 06/14/1961 Age: 50 y.o. 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 Post-op course complicated by hypoxia, PCCM consutled 12/24. CTA which was neg for PE, but showed massively dilated stomach.  NG placed, drained 1L fluid immediately.  Patient feels much better than before, but remains mildly hypoxic.  CTA with mild atelectasis.  Lines/tubes  Culture data/sepsis markers 12/20 MRSA PCR>>>neg  Antibiotics 12/20 Cipro (OR) x1 12/20 Flagyl (OR) x1  Best practice DVT: SCD's GI: not indicated   Events/studies  SUBJECTIVE:   Breathing better with no increased wob or desat on 3lpm np  not much cough.  Denies chest pain.  Temp:  [97.9 F (36.6 C)-99.4 F (37.4 C)] 97.9 F (36.6 C) (12/26 1200) Pulse Rate:  [78-98] 90  (12/26 0747) Resp:  [11-22] 21  (12/26 0747) BP: (133-142)/(67-81) 142/78 mmHg (12/26 0747) SpO2:  [93 %-100 %] 99 % (12/26 0747)    . 0.9 % NaCl with KCl 20 mEq / L 125 mL/hr at 06/27/11 1043      Intake/Output Summary (Last 24 hours) at 06/27/11 1217 Last data filed at 06/27/11 1200  Gross per 24 hour  Intake   2620 ml  Output   2671 ml  Net    -51 ml   Physical exam General: WDWN adult male in NAD ENT: Oropharynx clear. Moist mucous membranes. No thrush Heart: Normal S1, S2. No murmurs, rubs, or gallops appreciated. No bruits, equal pulses. Lungs: faint crackles in bases Abdomen: Abdomen soft, mildly appropriately tender and not distended, normoactive bowel sounds. No hepatosplenomegaly or masses. Skin: No rashes or lesions Neuro: AAOx4, MAE  radiology 12/25 CXR>>>atx L>R   LAB RESULT  Lab 06/27/11 0305 06/26/11 06/25/11  0320  NA 142 142 140  K 3.5 3.8 3.9  CL 102 100 98  CO2 30 35* 35*  BUN 11 17 15   CREATININE 0.68 0.80 0.89  GLUCOSE 82 91 105*    Lab 06/26/11 06/25/11 0320 06/24/11 1635  HGB 9.1* 9.1* 8.7*  HCT 27.5* 27.2* 25.8*  WBC 8.2 5.5 4.9  PLT 247 253 223    Assessment and Plan   Perforation of colon - s/p Sigmoid colectomy / diverting ileostomy 12/20 Assessment: s/p takedown of ileo-loop colostomy.  KUB with ileus, now passing gas.  Plan: -Rec's per CCS -NGT per surgery  Hypoxia Assessment: s/p colostomy takedown.  Suspect the majority of this is due to atelectasis and splinting.  Continued smoker. Clinically better.  Plan: -O2 to keep sats >92% -aggressive pulmonary hygiene -mobilize when able  Hypertension Assessment: on imdur at home Plan: -continue home medications as ordered.   BABCOCK,PETE,06/27/2011, 12:17 PM  PCCM will sign off, call PRN.   Pt independently  seen and examined and available cxr's reviewed and I agree with above findings/ imp/ plan    Sandrea Hughs, MD Pulmonary and Critical Care Medicine [facility] [phone]

## 2011-06-28 DIAGNOSIS — R0902 Hypoxemia: Secondary | ICD-10-CM

## 2011-06-28 MED ORDER — HYDROMORPHONE HCL PF 1 MG/ML IJ SOLN
0.5000 mg | INTRAMUSCULAR | Status: DC | PRN
  Administered 2011-06-28 – 2011-06-29 (×6): 1 mg via INTRAVENOUS
  Filled 2011-06-28 (×6): qty 1

## 2011-06-28 MED ORDER — BIOTENE DRY MOUTH MT LIQD
15.0000 mL | Freq: Two times a day (BID) | OROMUCOSAL | Status: DC
  Administered 2011-06-28 – 2011-06-30 (×5): 15 mL via OROMUCOSAL

## 2011-06-28 MED ORDER — CHLORHEXIDINE GLUCONATE 0.12 % MT SOLN
15.0000 mL | Freq: Two times a day (BID) | OROMUCOSAL | Status: DC
  Administered 2011-06-29: 15 mL via OROMUCOSAL
  Filled 2011-06-28 (×3): qty 15

## 2011-06-28 NOTE — Progress Notes (Addendum)
Scott Berry [birth year] [id number]  PCP: Ardeth Sportsman., MD, MD  Outpatient Care Team: Patient Care Team: Ardeth Sportsman, MD as PCP - General (General Surgery) Donato Schultz, MD as Consulting Physician (Cardiology)  Inpatient Treatment Team: Treatment Team: Attending Provider: Ardeth Sportsman, MD; Technician: Dene Gentry Long, NT; Registered Nurse: Rometta Emery, RN; Respiratory Therapist: Higinio Plan, RRT; Respiratory Therapist: Fontaine No, RRT   LOS: 7 days   7 Days Post-Op  Procedure(s): Loop ileostomy takedown  Subjective:  No major events Taking sips OK.  NGT in place No N/V Sore but medicines helping.  Prefers IV Dilaudid Walked x 2   Objective:  Vital signs:  Temp:  [97.5 F (36.4 C)-99.2 F (37.3 C)] 99.2 F (37.3 C) (12/27 0330) Pulse Rate:  [71-106] 76  (12/27 0330) Resp:  [12-22] 15  (12/27 0330) BP: (121-136)/(68-89) 121/89 mmHg (12/27 0330) SpO2:  [93 %-99 %] 93 % (12/27 0330) Last BM Date: 06/21/11  Intake/Output    from previous day: 12/26 0701 - 12/27 0700 In: 2965 [I.V.:2875; NG/GT:90] Out: 1302 [Urine:600; Emesis/NG output:700; Stool:2]  this shift:    Flatus: yes BM: yes  Physical Exam:  General: Pt awake/alert/oriented x4 in no acute distress Eyes: PERRL, normal EOM.  Sclera clear.  No icterus Neuro: CN II-XII intact w/o focal sensory/motor deficits. Lymph: No head/neck/groin lymphadenopathy Psych:  No delerium/psychosis/paranoia HENT: Normocephalic, Mucus membranes moist.  No thrush Neck: Supple, No tracheal deviation Chest: Clear No chest wall pain w good excursion CV:  Pulses intact.  Regular rhythm Abdomen: Soft, Nontender/Nondistended.  No incarcerated hernias.  Mild TTP at R abd old ileostomy site.  Incision clean & dry Ext:  SCDs BLE.  No mjr edema.  No cyanosis Skin: No petechiae / purpurae  Results:   Labs: Results for orders placed during the hospital encounter of 06/21/11 (from the  past 48 hour(s))  BASIC METABOLIC PANEL     Status: Normal   Collection Time   06/27/11  3:05 AM      Component Value Range Comment   Sodium 142  135 - 145 (mEq/L)    Potassium 3.5  3.5 - 5.1 (mEq/L)    Chloride 102  96 - 112 (mEq/L)    CO2 30  19 - 32 (mEq/L)    Glucose, Bld 82  70 - 99 (mg/dL)    BUN 11  6 - 23 (mg/dL)    Creatinine, Ser 7.82  0.50 - 1.35 (mg/dL)    Calcium 8.4  8.4 - 10.5 (mg/dL)    GFR calc non Af Amer >90  >90 (mL/min)    GFR calc Af Amer >90  >90 (mL/min)     Imaging / Studies: No results found.  Antibiotics: Anti-infectives     Start     Dose/Rate Route Frequency Ordered Stop   06/21/11 1032   ciprofloxacin (CIPRO) IVPB 400 mg  Status:  Discontinued        400 mg 200 mL/hr over 60 Minutes Intravenous On call to O.R. 06/21/11 1032 06/21/11 1600   06/21/11 1032   metroNIDAZOLE (FLAGYL) IVPB 500 mg  Status:  Discontinued        500 mg 100 mL/hr over 60 Minutes Intravenous On call to O.R. 06/21/11 1032 06/21/11 1600          Medications / Allergies: per chart  Assessment / Plan: Scott Berry  50 y.o. male   7 Days Post-Op  Procedure(s): Takedown of loop ileostomy   Problem  List:  Principal Problem:  *Perforation of colon - s/p Sigmoid colectomy / diverting ileostomy Active Problems:  Hypertension  Postoperative ileus   -post-op ileus  -D/C NGT  -sips liquids PO for now -post-op hypoxia  -wean oxygen  -due to increased atelectasis - should improve as abd distension decreases  -stop smoking -PRN B blocker -VTE prophylaxis- SCDs, etc -mobilize as tolerated to help recovery   [name], M.D., F.A.C.S. Gastrointestinal and Minimally Invasive Surgery [facility] [address], [address] [place], [place] [address] [phone] Main / Paging [phone] Voice Mail   06/28/2011

## 2011-06-29 MED ORDER — HYDROCODONE-ACETAMINOPHEN 5-325 MG PO TABS
1.0000 | ORAL_TABLET | ORAL | Status: DC | PRN
  Administered 2011-06-29 – 2011-07-01 (×10): 2 via ORAL
  Filled 2011-06-29 (×10): qty 2

## 2011-06-29 MED ORDER — ALBUTEROL SULFATE (5 MG/ML) 0.5% IN NEBU: 2.5000 mg | INHALATION_SOLUTION | RESPIRATORY_TRACT | Status: DC | PRN

## 2011-06-29 MED ORDER — ALBUTEROL SULFATE (5 MG/ML) 0.5% IN NEBU
2.5000 mg | INHALATION_SOLUTION | Freq: Three times a day (TID) | RESPIRATORY_TRACT | Status: DC
  Administered 2011-06-29 (×2): 2.5 mg via RESPIRATORY_TRACT
  Filled 2011-06-29 (×2): qty 0.5

## 2011-06-29 MED ORDER — ALBUTEROL SULFATE (5 MG/ML) 0.5% IN NEBU: 2.5000 mg | INHALATION_SOLUTION | Freq: Four times a day (QID) | RESPIRATORY_TRACT | Status: DC | PRN

## 2011-06-29 NOTE — Progress Notes (Signed)
INITIAL ADULT NUTRITION ASSESSMENT Date: 06/29/2011   Time: 3:22 PM Reason for Assessment: NPO/Clear liquid x 7 days  ASSESSMENT: Male 50 y.o.  Dx: Perforation of colon  Hx:  Past Medical History  Diagnosis Date  . Hypertension   . Sleep apnea 1995    not a problem now  . Shortness of breath     if don't take medicines  . Coronary artery arteriosclerosis    Related Meds:  Scheduled Meds:   . albuterol  2.5 mg Nebulization TID  . antiseptic oral rinse  15 mL Mouth Rinse q12n4p  . chlorhexidine  15 mL Mouth Rinse BID  . heparin  5,000 Units Subcutaneous Q8H  . lip balm  1 application Topical BID  . nitroGLYCERIN  1 inch Topical Q6H  . DISCONTD: albuterol  2.5 mg Nebulization Q6H   Continuous Infusions:   . 0.9 % NaCl with KCl 20 mEq / L 100 mL/hr at 06/29/11 1412   PRN Meds:.acetaminophen, albuterol, alum & mag hydroxide-simeth, diphenhydrAMINE, guaiFENesin-dextromethorphan, HYDROmorphone (DILAUDID) injection, magic mouthwash, metoprolol, ondansetron, ondansetron, promethazine, DISCONTD: ondansetron (ZOFRAN) IV  Ht: 5\' 9"  (175.3 cm)  Wt: 130 lb (58.968 kg)  Ideal Wt: 72.7kg % Ideal Wt: 81  Usual Wt: 61.4kg % Usual Wt: 96  Body mass index is 19.20 kg/(m^2).  Food/Nutrition Related Hx: POD#8 takedown of loop ileostomy. Pt on sips of liquids, however states he is very hungry and ready for diet to be advanced, denies any nausea. 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 Pt reports 5 pound unintentional weight loss in the past couple months d/t this diet restriction. Pt reports having dentures, denies any problems swallowing. Pt previously in ICU this admission for shortness of breath, which is improved, pt left ICU yesterday and transferred to floor. Pt also had NG tube, which was removed yesterday. Pt found to have post-op ileus which is resolving per MD  notes. Pt with multiple loose stools yesterday.   Labs:  CMP     Component Value Date/Time   NA 142 06/27/2011 0305   K 3.5 06/27/2011 0305   CL 102 06/27/2011 0305   CO2 30 06/27/2011 0305   GLUCOSE 82 06/27/2011 0305   BUN 11 06/27/2011 0305   CREATININE 0.68 06/27/2011 0305   CALCIUM 8.4 06/27/2011 0305   PROT 5.9* 06/26/2011 0000   ALBUMIN 2.7* 06/26/2011 0000   AST 15 06/26/2011 0000   ALT 7 06/26/2011 0000   ALKPHOS 42 06/26/2011 0000   BILITOT 0.4 06/26/2011 0000   GFRNONAA >90 06/27/2011 0305   GFRAA >90 06/27/2011 0305    Intake/Output Summary (Last 24 hours) at 06/29/11 1528 Last data filed at 06/29/11 1412  Gross per 24 hour  Intake   2732 ml  Output   1101 ml  Net   1631 ml  Last BM - 12/27, 4 loose stools  Diet Order:  NPO  IVF:    0.9 % NaCl with KCl 20 mEq / L Last Rate: 100 mL/hr at 06/29/11 1412    Estimated Nutritional Needs:   Kcal:1950-2250 Protein:90-105g Fluid:1.9-2.2L  NUTRITION DIAGNOSIS: -Inadequate oral intake (NI-2.1).  Status: Ongoing -Pt likely with moderate PCM r/t social/environmental circumstances AEB <75% intake in the past couple of months with 3.7% weight loss per pt report of eating only 1 meal/day to avoid changing ileostomy bag frequently  RELATED TO: post-op ileus  AS EVIDENCE BY:  DG of abdomen 12/25, NPO status  MONITORING/EVALUATION(Goals): Advance diet as tolerated to regular diet.   EDUCATION NEEDS: -Education needs addressed - pt interested in eating better once diet advanced - provided pt with handout of high calorie/protein diet and reviewed importance of protein in wound healing. Pt expressed understanding, RD contact information given.   INTERVENTION: Diet advancement per MD. Will monitor.   Dietitian # [id number]  DOCUMENTATION CODES Per approved criteria  -Non-severe (moderate) malnutrition in the context of social or environmental circumstances    Marshall Cork 06/29/2011, 3:22 PM

## 2011-06-29 NOTE — Progress Notes (Signed)
Scott Berry 1961-03-20 [id number]  PCP: Ardeth Sportsman., MD, MD  Outpatient Care Team: Patient Care Team: Ardeth Sportsman, MD as PCP - General (General Surgery) Donato Schultz, MD as Consulting Physician (Cardiology)  Inpatient Treatment Team: Treatment Team: Attending Provider: Ardeth Sportsman, MD; Technician: Dene Gentry Long, NT; Respiratory Therapist: Higinio Plan, RRT; Registered Nurse: Pervis Hocking, RN; Technician: Gala Murdoch, NT; Registered Nurse: Myrtie Hawk Means, RN; Respiratory Therapist: Adolm Joseph, RRT   LOS: 8 days   8 Days Post-Op  Procedure(s): Loop ileostomy takedown  Subjective: Transferred to floor No major events Taking sips well with NGT out No N/V.  Hungry Little abd pain now Walked x 0 today   Objective:  Vital signs:  Temp:  [97.8 F (36.6 C)-98.4 F (36.9 C)] 97.8 F (36.6 C) (12/28 0602) Pulse Rate:  [64-86] 64  (12/28 0602) Resp:  [16-20] 20  (12/28 0602) BP: (146-152)/(76-80) 149/76 mmHg (12/28 0602) SpO2:  [92 %-99 %] 99 % (12/28 1444) FiO2 (%):  [2 %] 2 % (12/27 1544) Last BM Date: 06/28/11  Intake/Output    from previous day: 12/27 0701 - 12/28 0700 In: 2772 [P.O.:590; I.V.:2182] Out: 1501 [Urine:1500; Stool:1]  this shift: Total I/O In: 935 [P.O.:120; I.V.:815] Out: 250 [Urine:250]  Flatus: yes BM: yes  Physical Exam:  General: Pt awake/alert/oriented x4 in no acute distress Eyes: PERRL, normal EOM.  Sclera clear.  No icterus Neuro: CN II-XII intact w/o focal sensory/motor deficits. Lymph: No head/neck/groin lymphadenopathy Psych:  No delerium/psychosis/paranoia HENT: Normocephalic, Mucus membranes moist.  No thrush Neck: Supple, No tracheal deviation Chest: Clear No chest wall pain w good excursion CV:  Pulses intact.  Regular rhythm Abdomen: Soft, Nontender/Nondistended.  No incarcerated hernias.  Incision clean & dry Ext:  SCDs BLE.  No mjr edema.  No cyanosis Skin: No  petechiae / purpurae  Results:   Labs: No results found for this or any previous visit (from the past 48 hour(s)).  Imaging / Studies: No results found.  Antibiotics: Anti-infectives     Start     Dose/Rate Route Frequency Ordered Stop   06/21/11 1032   ciprofloxacin (CIPRO) IVPB 400 mg  Status:  Discontinued        400 mg 200 mL/hr over 60 Minutes Intravenous On call to O.R. 06/21/11 1032 06/21/11 1600   06/21/11 1032   metroNIDAZOLE (FLAGYL) IVPB 500 mg  Status:  Discontinued        500 mg 100 mL/hr over 60 Minutes Intravenous On call to O.R. 06/21/11 1032 06/21/11 1600          Medications / Allergies: per chart  Assessment / Plan: Scott Berry  50 y.o. male   8 Days Post-Op  Procedure(s): Takedown of loop ileostomy   Problem List:  Principal Problem:  *Perforation of colon - s/p Sigmoid colectomy / diverting ileostomy Active Problems:  Hypertension  Postoperative ileus   -post-op ileus  -ad lib liquids PO for now -post-op hypoxia  -weaned oxygen  -walk more!  -due to increased atelectasis - should improve as abd distension decreases  -stop smoking -PRN B blocker -VTE prophylaxis- SCDs, etc -mobilize as tolerated to help recovery   [name], M.D., F.A.C.S. Gastrointestinal and Minimally Invasive Surgery [facility] [address] [place], [place] [address] [phone] Main / Paging [phone] Voice Mail   06/29/2011

## 2011-06-30 DIAGNOSIS — I10 Essential (primary) hypertension: Secondary | ICD-10-CM

## 2011-06-30 MED ORDER — LACTATED RINGERS IV BOLUS (SEPSIS): 1000.0000 mL | Freq: Three times a day (TID) | INTRAVENOUS | Status: DC | PRN

## 2011-06-30 MED ORDER — ISOSORBIDE MONONITRATE ER 30 MG PO TB24
30.0000 mg | ORAL_TABLET | Freq: Two times a day (BID) | ORAL | Status: DC
  Administered 2011-06-30 – 2011-07-01 (×3): 30 mg via ORAL
  Filled 2011-06-30 (×4): qty 1

## 2011-06-30 MED ORDER — IBUPROFEN 600 MG PO TABS
600.0000 mg | ORAL_TABLET | Freq: Three times a day (TID) | ORAL | Status: DC | PRN
  Filled 2011-06-30: qty 1

## 2011-06-30 MED ORDER — SODIUM CHLORIDE 0.9 % IJ SOLN
3.0000 mL | Freq: Two times a day (BID) | INTRAMUSCULAR | Status: DC
  Administered 2011-06-30 (×2): 3 mL via INTRAVENOUS

## 2011-06-30 MED ORDER — SODIUM CHLORIDE 0.9 % IJ SOLN: 3.0000 mL | INTRAMUSCULAR | Status: DC | PRN

## 2011-06-30 MED ORDER — METOPROLOL TARTRATE 12.5 MG HALF TABLET
12.5000 mg | ORAL_TABLET | Freq: Two times a day (BID) | ORAL | Status: DC | PRN
  Filled 2011-06-30: qty 1

## 2011-06-30 NOTE — Progress Notes (Signed)
Scott Berry [birth year] [id number]  PCP: Scott Berry., MD, MD  Outpatient Care Team: Patient Care Team: Scott Sportsman, MD as PCP - General (General Surgery) Scott Schultz, MD as Consulting Physician (Cardiology)  Inpatient Treatment Team: Treatment Team: Attending Provider: Ardeth Sportsman, MD; Technician: Scott Berry, NT; Respiratory Therapist: Higinio Berry, RRT; Technician: Scott Berry, NT; Registered Nurse: Scott Hawk Means, RN; Respiratory Therapist: Adolm Berry, RRT; Dietitian: Scott Berry, RD; Technician: Scott Berry   LOS: 9 days   9 Days Post-Op  Procedure(s): Loop ileostomy takedown  Subjective: No major events Taking liquids OK.  Ate all of fulls for breakfast No N/V. Little abd pain now Walked x 3 yest   Objective:  Vital signs:  Temp:  [97.7 F (36.5 C)-98.1 F (36.7 C)] 97.7 F (36.5 C) (12/29 4098) Pulse Rate:  [59-80] 59  (12/29 0632) Resp:  [17-18] 18  (12/29 1191) BP: (132-165)/(73-83) 148/80 mmHg (12/29 0632) SpO2:  [94 %-99 %] 94 % (12/29 0632) Last BM Date: 06/30/11  Intake/Output    from previous day: 12/28 0701 - 12/29 0700 In: 2060 [P.O.:240; I.V.:1820] Out: 1150 [Urine:1150]  this shift:    Flatus: yes BM: No  Physical Exam:  General: Pt awake/alert/oriented x4 in no acute distress Eyes: PERRL, normal EOM.  Sclera clear.  No icterus Neuro: CN II-XII intact w/o focal sensory/motor deficits. Lymph: No head/neck/groin lymphadenopathy Psych:  No delerium/psychosis/paranoia.  Hygeine fair HENT: Normocephalic, Mucus membranes moist.  No thrush Neck: Supple, No tracheal deviation Chest: Clear No chest wall pain w good excursion CV:  Pulses intact.  Regular rhythm Abdomen: Soft, Nontender/Nondistended.  No incarcerated hernias.  Incision clean & dry Ext:  SCDs BLE.  No mjr edema.  No cyanosis Skin: No petechiae / purpurae  Results:   Labs: No results found for this or any  previous visit (from the past 48 hour(s)).  Imaging / Studies: No results found.  Antibiotics: Anti-infectives     Start     Dose/Rate Route Frequency Ordered Stop   06/21/11 1032   ciprofloxacin (CIPRO) IVPB 400 mg  Status:  Discontinued        400 mg 200 mL/hr over 60 Minutes Intravenous On call to O.R. 06/21/11 1032 06/21/11 1600   06/21/11 1032   metroNIDAZOLE (FLAGYL) IVPB 500 mg  Status:  Discontinued        500 mg 100 mL/hr over 60 Minutes Intravenous On call to O.R. 06/21/11 1032 06/21/11 1600          Medications / Allergies: per chart  Assessment / Berry: Scott Berry  50 y.o. male   9 Days Post-Op  Procedure(s): Takedown of loop ileostomy   Problem List:  Principal Problem:  *Perforation of colon - s/p Sigmoid colectomy / diverting ileostomy with ileostomy takedown  -avoid new rectal trauma Active Problems: -post-op ileus  -adv diet -post-op hypoxia  -weaned oxygen  -walk more!  -due to increased atelectasis - should improve as abd distension decreases  -stop smoking -PRN B blocker -VTE prophylaxis- SCDs ref by pt.  Heparin SQ -mobilize as tolerated to help recovery -BP mildly elevated   -restart imdur  -B blocker PRN   Scott Berry, M.D., F.A.C.S. Gastrointestinal and Minimally Invasive Surgery [facility] [address] [place], [place] [address] [phone] Main / Paging [phone] Voice Mail   06/30/2011

## 2011-07-01 ENCOUNTER — Encounter (HOSPITAL_COMMUNITY): Payer: Self-pay | Admitting: Surgery

## 2011-07-01 MED ORDER — ALBUTEROL SULFATE HFA 108 (90 BASE) MCG/ACT IN AERS: 2.0000 | INHALATION_SPRAY | Freq: Four times a day (QID) | RESPIRATORY_TRACT | Status: DC | PRN

## 2011-07-01 MED ORDER — ALBUTEROL SULFATE (5 MG/ML) 0.5% IN NEBU: 2.5000 mg | INHALATION_SOLUTION | Freq: Four times a day (QID) | RESPIRATORY_TRACT | Status: DC | PRN

## 2011-07-01 MED ORDER — HYDROCODONE-ACETAMINOPHEN 5-325 MG PO TABS: 1.0000 | ORAL_TABLET | ORAL | Status: AC | PRN

## 2011-07-01 MED ORDER — ISOSORBIDE MONONITRATE ER 30 MG PO TB24: 30.0000 mg | ORAL_TABLET | Freq: Two times a day (BID) | ORAL | Status: DC

## 2011-07-01 NOTE — Progress Notes (Signed)
   CARE MANAGEMENT NOTE 07/01/2011  Patient:  Scott Berry, Scott Berry   Account Number:  [id number]  Date Initiated:  06/22/2011  Documentation initiated by:  CRAFT,TERRI  Subjective/Objective Assessment:   50 yo male admitted 06/21/11 for takedown of loop ileostomy     Action/Plan:   D/C when medically stable.   Anticipated DC Date:  06/25/2011   Anticipated DC Plan:  HOME/SELF CARE  In-house referral  Clinical Social Worker      DC Planning Services  CM consult  Medication Assistance      Choice offered to / List presented to:             Status of service:  Completed, signed off Medicare Important Message given?  NO (If response is "NO", the following Medicare IM given date fields will be blank) Date Medicare IM given:   Date Additional Medicare IM given:    Discharge Disposition:  HOME/SELF CARE  Per UR Regulation:    Comments:  07/01/11 1130--Spoke with Patty, RN taking care of pt., about medication assistance.  Will take 3 day supply rx of Imdur, and Albuterol Nebulizer Solution to main pharmacy to be filled.  Tera Mater, RN, BSN Case Manager [phone]   06/22/11, Kathi Der RNC-MNN, BSN, [phone], CM received referral for possible medication assistance, community support.  CM spoke to pt about medication assistance.  Pt states that he has not received medication assistance here in the last 6 months-this was confirmed with Toniann Fail in the [facility] pharmacy.  In formed pt and pt's nurse that hospital will not assist with narcotics. Will await discharge medications for pt to assist.  Referred to CSW for community support issues.  Pt currently lives in a halfway house.  Discussed this with pt's nurse also and notified CSW of referral.  Will follow

## 2011-07-01 NOTE — Discharge Summary (Signed)
Physician Discharge Summary  Patient ID: Scott Berry MRN: [id number] DOB/AGE: May 11, 1961 50 y.o.  Admit date: 06/21/2011 Discharge date: 07/01/2011  Admission Diagnoses:  Discharge Diagnoses:  Principal Problem:  *Perforation of colon - s/p Sigmoid colectomy / diverting ileostomy Active Problems:  Hypertension   Discharged Condition: good  Hospital Course:  The patient underwent diverting loop ileostomy takedown on admission. He was placed on an anti-ileus protocol. He initially tolerated liquids. He then developed some abdominal distention and hypoxia. He was transferred to the intensive care unit. Pulmonary/CCM was consulted. Workup was consistent with atelectasis. No definite pneumonia. He is a smoker. He was encouraged to stop smoking. After more aggressive pulmonary toilet, he improved. He mobilized more. He began to have flatus. He began to have bowel movements. His NG tube clamped.  He tolerated this, so his NGT was removed. He was re-advanced on his diet.  By the time of discharge, he was tolerating a solid diet. He was having flatus and bowel movements. His pain was minimal and adequately controlled. He had no fevers chills or sweats. His ileostomy wound site was closed.  Based on improvements we thought it would be reasonable for the patient to be discharged home. The patient lives in a halfway house. He claims he has no money for any medications at all. He requested help. We'll have care management see what medications they can help pay for him.    Consults: pulmonary/intensive care  Significant Diagnostic Studies: radiology: CXR: atelectasis bilaterally  Treatments: respiratory therapy: O2  Loop ileostomy takedown 20Dec2012  Discharge Exam: Blood pressure 117/70, pulse 73, temperature 98.3 F (36.8 C), temperature source Oral, resp. rate 18, height 5\' 9"  (1.753 m), weight 130 lb (58.968 kg), SpO2 90.00%.  General: Pt awake/alert/oriented x4 in no major acute  distress Eyes: PERRL, normal EOM. Neuro: CN II-XII intact w/o focal sensory/motor deficits. Lymph: No head/neck/groin lymphadenopathy Psych:  No delerium/psychosis/paranoia HEENT: Normocephalic, Mucus membranes moist.  No thrush Neck: Supple, No tracheal deviation Chest: No pain w good excursion CV:  Pulses intact.  Regular rhythm Abdomen: soft, minimally tender/nondistended.  Incision clean/dry.  No incarcerated hernias. Ext:  SCDs BLE.  No mjr edema.  No cyanosis Skin: No petechiae / purpurae   Disposition: ED Dismiss - Never Arrived  Discharge Orders    Future Orders Please Complete By Expires   Diet - low sodium heart healthy      Increase activity slowly      Call MD for:  persistant nausea and vomiting      Call MD for:  severe uncontrolled pain      Call MD for:  redness, tenderness, or signs of infection (pain, swelling, redness, odor or green/yellow discharge around incision site)      Call MD for:  extreme fatigue        Medication List  As of 07/01/2011 11:07 AM   START taking these medications         albuterol (5 MG/ML) 0.5% nebulizer solution   Commonly known as: PROVENTIL   Take 0.5 mLs (2.5 mg total) by nebulization every 6 (six) hours as needed for wheezing or shortness of breath.      HYDROcodone-acetaminophen 5-325 MG per tablet   Commonly known as: NORCO   Take 1-2 tablets by mouth every 4 (four) hours as needed (use if taking PO well.).      * isosorbide mononitrate 30 MG 24 hr tablet   Commonly known as: IMDUR   Take 1 tablet (30  mg total) by mouth 2 (two) times daily.     * Notice: This list has 1 medication(s) that are the same as other medications prescribed for you. Read the directions carefully, and ask your doctor or other care provider to review them with you.       CONTINUE taking these medications         ibuprofen 200 MG tablet   Commonly known as: ADVIL,MOTRIN      * isosorbide mononitrate 30 MG 24 hr tablet   Commonly known as: IMDUR      * Notice: This list has 1 medication(s) that are the same as other medications prescribed for you. Read the directions carefully, and ask your doctor or other care provider to review them with you.        Where to get your medications    These are the prescriptions that you need to pick up. We sent them to a specific pharmacy, so you will need to go there to get them.   [id number] - [place], [place] - [address]    [address] [place]  [address]    Phone: [phone]        albuterol (5 MG/ML) 0.5% nebulizer solution   isosorbide mononitrate 30 MG 24 hr tablet         You may get these medications from any pharmacy.         HYDROcodone-acetaminophen 5-325 MG per tablet           Follow-up Information    Follow up with Ardeth Sportsman., MD. Make an appointment in 2 weeks.   Contact information:   [facility] [address] [place] [address] [phone]          Signed: Ardeth Sportsman. 07/01/2011, 11:01 AM  Ardeth Sportsman, M.D., F.A.C.S. Gastrointestinal and Minimally Invasive Surgery [facility] [address] [place], [place] [address] [phone] Main / Paging [phone] Voice Mail

## 2011-07-13 MED FILL — Mupirocin Oint 2%: CUTANEOUS | Qty: 22 | Status: AC

## 2011-07-13 MED FILL — Bupivacaine Inj 0.25% w/ Epinephrine 1:200000: INTRAMUSCULAR | Qty: 50 | Status: AC

## 2011-07-25 ENCOUNTER — Encounter (INDEPENDENT_AMBULATORY_CARE_PROVIDER_SITE_OTHER): Payer: Self-pay | Admitting: Surgery

## 2011-08-15 ENCOUNTER — Ambulatory Visit (INDEPENDENT_AMBULATORY_CARE_PROVIDER_SITE_OTHER): Payer: PRIVATE HEALTH INSURANCE | Admitting: Surgery

## 2011-08-15 ENCOUNTER — Encounter (INDEPENDENT_AMBULATORY_CARE_PROVIDER_SITE_OTHER): Payer: Self-pay | Admitting: Surgery

## 2011-08-15 VITALS — BP 130/88 | HR 78 | Temp 97.9°F | Resp 18 | Ht 69.0 in | Wt 134.0 lb

## 2011-08-15 DIAGNOSIS — Z860100 Personal history of colon polyps, unspecified: Secondary | ICD-10-CM

## 2011-08-15 DIAGNOSIS — Z720 Tobacco use: Secondary | ICD-10-CM

## 2011-08-15 DIAGNOSIS — F17200 Nicotine dependence, unspecified, uncomplicated: Secondary | ICD-10-CM

## 2011-08-15 DIAGNOSIS — K631 Perforation of intestine (nontraumatic): Secondary | ICD-10-CM

## 2011-08-15 DIAGNOSIS — F1910 Other psychoactive substance abuse, uncomplicated: Secondary | ICD-10-CM

## 2011-08-15 HISTORY — DX: Personal history of colon polyps, unspecified: Z86.0100

## 2011-08-15 HISTORY — DX: Other psychoactive substance abuse, uncomplicated: F19.10

## 2011-08-15 HISTORY — DX: Personal history of colonic polyps: Z86.010

## 2011-08-15 HISTORY — DX: Tobacco use: Z72.0

## 2011-08-15 NOTE — Patient Instructions (Signed)
Avoid any trauma to area  Consider a Colonoscopy A colonoscopy is an exam to look at your colon. This exam helps to find lumps (tumors), growths (polyps), puffiness (swelling), and bleeding in your colon.  BEFORE THE PROCEDURE  You may need to drink clear liquids for 2 days before the exam.   Ask your doctor about changing or stopping your regular medicines.   You may need liquid or medicines put in your butt (enema or laxatives) to help you poop.   You may need to drink a liquid over a short amount of time. This liquid cleans your colon.   Ask your doctor what time you need to arrive.  PROCEDURE A tube is put in the opening of your butt (anus) and into your colon. The doctor will look for anything that is not normal. Your doctor may take a tissue sample (biopsy) from your colon to be looked at more closely. AFTER THE PROCEDURE  Have someone drive you home if you took pain medicine or a medicine to relax you (sedative).   You may see blood in your poop (stool). This is normal.   You may pass gas and have belly (abdominal) cramps. This is normal.  Finding out the results of your test Ask when your test results will be ready. Make sure you get your test results. Document Released: 07/21/2010 Document Revised: 02/28/2011 Document Reviewed: 07/21/2010 [facility] Patient Information 2012 [place], [place].

## 2011-08-15 NOTE — Progress Notes (Signed)
Subjective:     Patient ID: Scott Berry, male   DOB: 1960-09-03, 51 y.o.   MRN: [id number]  HPI  Scott Berry  1960/10/25 [id number]  Patient Care Team: Provider Not In System as PCP - General Donato Schultz, MD as Consulting Physician (Cardiology) Rob Bunting, MD as Consulting Physician (Gastroenterology)  This patient is a 51 y.o.male who presents today for surgical evaluation.   Diagnosis: Rectal trauma from self-induced foreign body with perforation status post colectomy and diverting loop ileostomy June 2012  Procedure: Ileostomy takedown 06/21/2011  The patient returns feeling well. He had canceled a few of the appointments. He is eating well. Daily bowel movements. No fevers or chills. He did have an episode of abdominal pain with some very intense lifting last week so backed off. Feeling better.   He is back to smoking. He's lost some weight but feels like his energy level is good & gaining some back. He was taking Citrucel t.i.d. but then began to have a lot of loose bowel movements stopped all back. Eating well. No blood with his bowel movements.  No more episodes of self-anal probing.  He somehow avoided a colonoscopy perioperatively. I think her compromise was to do a barium enema. He does have a distant history of colon polyps. He recalls being told of a five-year followup. He is overdue for that.  Patient Active Problem List  Diagnoses  . Hypertension  . Coronary artery arteriosclerosis  . Polysubstance abuse in past  . Personal history of colonic polyps    Past Medical History  Diagnosis Date  . Hypertension   . Sleep apnea 1995    not a problem now  . Shortness of breath     if don't take medicines  . 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 Coronary artery arteriosclerosis   . Polysubstance abuse in past 08/15/2011  . Personal history of colonic polyps 08/15/2011    Past Surgical History  Procedure Date  . Foreign  body removal rectal Oct 2011  . Colon surgery 14Jun2012    colectomy/diverting ileostomy - colon perf  . Cardiac catheterization 09/10/2009    Results in chart  . Ileo loop diversion 06/21/2011    Procedure: Takedown of loop ileostomy  . Loop ileostomy takedown 20Dec2012    Procedure: Takedown of loop ileostomy    History   Social History  . Marital Status: Divorced    Spouse Name: N/A    Number of Children: N/A  . Years of Education: N/A   Occupational History  . Not on file.   Social History Main Topics  . Smoking status: Current Everyday Smoker -- 0.5 packs/day    Types: Cigarettes  . Smokeless tobacco: Never Used   Comment: Just started up again after 3 years  . Alcohol Use: No     Recovered alcoholic x 3 years  . Drug Use: No      None in about 25 years; But has been "working around acetone"  . Sexually Active: Not on file   Other Topics Concern  . Not on file   Social History Narrative  . No narrative on file    No family history on file.  Current outpatient prescriptions:albuterol (PROVENTIL HFA;VENTOLIN HFA) 108 (90 BASE) MCG/ACT inhaler, Inhale 2 puffs into the lungs every 6 (six) hours as needed for wheezing., Disp: 2 Inhaler, Rfl: 3;  albuterol (PROVENTIL) (5 MG/ML) 0.5% nebulizer solution, Take 0.5 mLs (2.5 mg total) by  nebulization every 6 (six) hours as needed for wheezing or shortness of breath., Disp: 20 mL, Rfl: 2 ibuprofen (ADVIL,MOTRIN) 200 MG tablet, Take 600 mg by mouth every 8 (eight) hours as needed. Pain , Disp: , Rfl: ;  isosorbide mononitrate (IMDUR) 30 MG 24 hr tablet, Take 30 mg by mouth 2 (two) times daily.  , Disp: , Rfl: ;  isosorbide mononitrate (IMDUR) 30 MG 24 hr tablet, Take 1 tablet (30 mg total) by mouth 2 (two) times daily., Disp: 60 tablet, Rfl: 2  Allergies  Allergen Reactions  . Penicillins Anaphylaxis    BP 130/88  Pulse 78  Temp(Src) 97.9 F (36.6 C) (Temporal)  Resp 18  Ht 5\' 9"  (1.753 m)  Wt 134 lb (60.782 kg)  BMI  19.79 kg/m2     Review of Systems  Constitutional: Negative for fever, chills and diaphoresis.  HENT: Negative for sore throat, trouble swallowing and neck pain.   Eyes: Negative for photophobia and visual disturbance.  Respiratory: Negative for choking and shortness of breath.   Cardiovascular: Negative for chest pain and palpitations.  Gastrointestinal: Negative for nausea, vomiting, abdominal distention, anal bleeding and rectal pain.  Genitourinary: Negative for dysuria, urgency, difficulty urinating and testicular pain.  Musculoskeletal: Negative for myalgias, arthralgias and gait problem.  Skin: Negative for color change and rash.  Neurological: Negative for dizziness, speech difficulty, weakness and numbness.  Hematological: Negative for adenopathy.  Psychiatric/Behavioral: Negative for hallucinations, confusion and agitation.       Objective:   Physical Exam  Constitutional: He is oriented to person, place, and time. He appears well-developed and well-nourished. No distress.       Thinner but active/alert/energetic  HENT:  Head: Normocephalic.  Mouth/Throat: Oropharynx is clear and moist. No oropharyngeal exudate.  Eyes: Conjunctivae and EOM are normal. Pupils are equal, round, and reactive to light. No scleral icterus.  Neck: Normal range of motion. No tracheal deviation present.  Cardiovascular: Normal rate, normal heart sounds and intact distal pulses.   Pulmonary/Chest: Effort normal. No respiratory distress.  Abdominal: Soft. He exhibits no distension and no mass. There is no tenderness. There is no rebound and no guarding. Hernia confirmed negative in the right inguinal area and confirmed negative in the left inguinal area.       R paramedian Incision clean with normal healing ridges.  No hernias  Musculoskeletal: Normal range of motion. He exhibits no tenderness.  Neurological: He is alert and oriented to person, place, and time. No cranial nerve deficit. He exhibits  normal muscle tone. Coordination normal.  Skin: Skin is warm and dry. No rash noted. He is not diaphoretic.  Psychiatric: He has a normal mood and affect. His behavior is normal.       Assessment:     Improving  Need for colonoscopy  Need to establish PCP    Plan:     Get a colonoscopy with his history of polyps. In the minor at least ruled out something major. We will see if he can try again with lower GI.  Try to establish with a primary care physician. He is comfortable Dr. Anne Fu as his cardiologist but has not seen him in a while. No major cardiac issues.  Increase activity as tolerated.  Do not push through pain.  Advanced on diet as tolerated. Bowel regimen to avoid problems.  We talked to the patient about the dangers of smoking.  We stressed that tobacco use dramatically increases the risk of peri-operative complications such as infection,  tissue necrosis leaving to problems with incision/wound and organ healing, heart attack, stroke, DVT, pulmonary embolism, and death.  We noted there are programs in our community to help stop smoking.  Return to clinic p.r.n. The patient expressed understanding and appreciation

## 2011-08-17 ENCOUNTER — Telehealth: Payer: Self-pay

## 2011-08-17 NOTE — Telephone Encounter (Signed)
Scott Berry Thanks, we'll get him in for colonsocopy.   Brelynn Wheller, Can you set him up with colonoscopy, [facility], direct. Thanks   ----- Message ----- From: Scott Sportsman, MD Sent: 08/15/2011 10:21 AM To: Rob Bunting, MD  Pt somehow missed getting a colonoscopy peri-op & is overdue. Distant h/o colon polyps last colon ~41yrs ago & knows he was supposed to get f/u one done already.  Rectal trauma s/p colectomy/diverting ileostomy June2012. ?of anastomotic leak so delayed takedown & scope w you in the Fall. Better & ileostomy down in [id number]. Finally came for post-op visit today doing well.

## 2013-12-25 ENCOUNTER — Encounter: Payer: Self-pay | Admitting: Cardiology

## 2014-01-26 ENCOUNTER — Ambulatory Visit: Payer: Self-pay | Admitting: Cardiology

## 2014-02-16 ENCOUNTER — Encounter: Payer: Self-pay | Admitting: *Deleted

## 2014-02-23 ENCOUNTER — Telehealth: Payer: Self-pay | Admitting: *Deleted

## 2014-02-23 MED ORDER — ISOSORBIDE MONONITRATE ER 30 MG PO TB24: 30.0000 mg | ORAL_TABLET | Freq: Two times a day (BID) | ORAL | Status: DC

## 2014-02-23 NOTE — Telephone Encounter (Signed)
Patient walked to the scheduling dept asking for a refill for Isosorbide. Last office visit was 01/28/2013. Has appointment with Dr.Skains on 8/27. States he has not had any isosorbide in about 1 week because he ran out. Cut the lawn yesterday and noticed some SOB and fatigue with activity. Denies any chest pain. Advised to restart isosorbide today and not to do any strenuous activity until seen by Dr.Skains. No complaints today. Will send in script for Isosorbide.

## 2014-02-25 ENCOUNTER — Ambulatory Visit: Payer: Self-pay | Admitting: Cardiology

## 2014-04-14 ENCOUNTER — Other Ambulatory Visit: Payer: Self-pay | Admitting: Cardiology

## 2014-04-19 ENCOUNTER — Other Ambulatory Visit: Payer: Self-pay

## 2014-04-19 MED ORDER — ISOSORBIDE MONONITRATE ER 30 MG PO TB24: 30.0000 mg | ORAL_TABLET | Freq: Two times a day (BID) | ORAL | Status: DC

## 2014-04-19 NOTE — Telephone Encounter (Signed)
You can refill x 1, but he needs an office visit. He no showed in August.

## 2014-04-26 ENCOUNTER — Encounter: Payer: Self-pay | Admitting: Cardiology

## 2014-04-26 ENCOUNTER — Ambulatory Visit (INDEPENDENT_AMBULATORY_CARE_PROVIDER_SITE_OTHER): Payer: Self-pay | Admitting: Cardiology

## 2014-04-26 VITALS — BP 122/84 | HR 88 | Ht 69.0 in | Wt 130.0 lb

## 2014-04-26 DIAGNOSIS — I25709 Atherosclerosis of coronary artery bypass graft(s), unspecified, with unspecified angina pectoris: Secondary | ICD-10-CM

## 2014-04-26 DIAGNOSIS — I10 Essential (primary) hypertension: Secondary | ICD-10-CM

## 2014-04-26 DIAGNOSIS — I208 Other forms of angina pectoris: Secondary | ICD-10-CM

## 2014-04-26 MED ORDER — ISOSORBIDE MONONITRATE ER 30 MG PO TB24: ORAL_TABLET | ORAL | Status: DC

## 2014-04-26 NOTE — Patient Instructions (Signed)
The current medical regimen is effective;  continue present plan and medications.  Your physician has requested that you have an echocardiogram. Echocardiography is a painless test that uses sound waves to create images of your heart. It provides your doctor with information about the size and shape of your heart and how well your heart's chambers and valves are working. This procedure takes approximately one hour. There are no restrictions for this procedure.  Your physician has requested that you have an exercise tolerance test. For further information please visit [email or web address]. Please also follow instruction sheet, as given.  Follow up with Dr Anne FuSkains in 1 month.

## 2014-04-26 NOTE — Progress Notes (Signed)
[address] [place], [place]  [address] Phone: [phone] Fax:  [phone]  Date:  04/26/2014   ID:  Scott Berry, DOB 01/15/1961, MRN [id number]  PCP:  No PCP Per Patient   History of Present Illness: Scott Berry is a 53 y.o. male with history of hyperlipidemia, prior drug use, hypertension with cardiac catheterization in March 2011 demonstrating moderate diffuse coronary artery disease, no PCI, medical management here for follow-up. Prior trouble continuing with medications. Both pravastatin and lisinopril have been stopped over time. Denies any significant chest pain. Sometimes feels a bam, in the chest. Sudden on and off. Lays down and hard to breath.  Imdur helps a lot he states.     Wt Readings from Last 3 Encounters:  04/26/14 130 lb (58.968 kg)  08/15/11 134 lb (60.782 kg)  06/21/11 130 lb (58.968 kg)     Past Medical History  Diagnosis Date  . Hypertension   . Sleep apnea 1995    not a problem now  . Shortness of breath     if don't take medicines  . 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 Coronary artery arteriosclerosis   . Polysubstance abuse in past 08/15/2011  . Personal history of colonic polyps 08/15/2011  . Tobacco abuse 08/15/2011  . Pure hypercholesterolemia     Past Surgical History  Procedure Laterality Date  . Foreign body removal rectal  Oct 2011  . Colon surgery  14Jun2012    colectomy/diverting ileostomy - colon perf  . Cardiac catheterization  09/10/2009    Results in chart  . Ileo loop diversion  06/21/2011    Procedure: Takedown of loop ileostomy  . Loop ileostomy takedown  20Dec2012    Procedure: Takedown of loop ileostomy    Current Outpatient Prescriptions  Medication Sig Dispense Refill  . isosorbide mononitrate (IMDUR) 30 MG 24 hr tablet take 1 tablet by mouth twice a day  60 tablet  0   No current facility-administered medications for this visit.    Allergies:     Allergies  Allergen Reactions  . Penicillins Anaphylaxis    Social History:  The patient  reports that he has been smoking Cigarettes.  He has been smoking about 0.50 packs per day. He has never used smokeless tobacco. He reports that he does not drink alcohol or use illicit drugs.   No family history on file.  ROS:  Please see the history of present illness.   Denies any fevers, chills, orthopnea, PND   All other systems reviewed and negative.   PHYSICAL EXAM: VS:  BP 122/84  Pulse 88  Ht 5\' 9"  (1.753 m)  Wt 130 lb (58.968 kg)  BMI 19.19 kg/m2 Well nourished, well developed, in no acute distress HEENT: normal, [place]/AT, EOMI Neck: no JVD, normal carotid upstroke, no bruit Cardiac:  normal S1, S2; RRR; no murmur Lungs:  clear to auscultation bilaterally, no wheezing, rhonchi or rales Abd: soft, nontender, no hepatomegaly, no bruits Ext: no edema, 2+ distal pulses Skin: warm and dry GU: deferred Neuro: no focal abnormalities noted, AAO x 3  EKG:   04/26/14-heart rate 88 bpmSinus rhythm, PVC, vertical axis, right atrial enlargement    peaked T waves.        ASSESSMENT AND PLAN:  1. Coronary artery disease/angina-I will resume his Imdur which she states has helped him out significantly. I will also check an echocardiogram to insure proper structure  and function of his heart as he does have symptoms at times of transient orthopnea. I will check an exercise treadmill test to make sure that he does not have any significant evidence of ischemia. Prior cardiac catheterization in 2011 showed moderate diffuse CAD. 2. Unfortunately, money is very tight for him. He is going to talk with the homeless shelter. He lost his electricity at home.  Signed, Donato SchultzMark Yulianna Folse, MD [facility], [place]  04/26/2014 3:00 PM

## 2014-04-28 ENCOUNTER — Other Ambulatory Visit (HOSPITAL_COMMUNITY): Payer: Self-pay

## 2014-05-05 ENCOUNTER — Emergency Department (HOSPITAL_COMMUNITY): Payer: Self-pay

## 2014-05-05 ENCOUNTER — Encounter (HOSPITAL_COMMUNITY): Payer: Self-pay | Admitting: Emergency Medicine

## 2014-05-05 ENCOUNTER — Encounter (HOSPITAL_COMMUNITY)
Admission: EM | Disposition: A | Discharge status: Home / Self Care | Payer: Self-pay | Source: Home / Self Care | Attending: Cardiology

## 2014-05-05 ENCOUNTER — Ambulatory Visit (HOSPITAL_COMMUNITY): Admit: 2014-05-05 | Payer: Self-pay | Admitting: Cardiovascular Disease

## 2014-05-05 ENCOUNTER — Inpatient Hospital Stay (HOSPITAL_COMMUNITY)
Admission: EM | Admit: 2014-05-05 | Discharge: 2014-05-08 | DRG: 281 | Disposition: A | Discharge status: Home / Self Care | Payer: Self-pay | Attending: Cardiology | Admitting: Cardiology

## 2014-05-05 DIAGNOSIS — I10 Essential (primary) hypertension: Secondary | ICD-10-CM | POA: Diagnosis present

## 2014-05-05 DIAGNOSIS — Z9114 Patient's other noncompliance with medication regimen: Secondary | ICD-10-CM | POA: Diagnosis present

## 2014-05-05 DIAGNOSIS — F17210 Nicotine dependence, cigarettes, uncomplicated: Secondary | ICD-10-CM | POA: Diagnosis present

## 2014-05-05 DIAGNOSIS — I472 Ventricular tachycardia: Secondary | ICD-10-CM | POA: Diagnosis present

## 2014-05-05 DIAGNOSIS — I2582 Chronic total occlusion of coronary artery: Secondary | ICD-10-CM | POA: Diagnosis present

## 2014-05-05 DIAGNOSIS — L271 Localized skin eruption due to drugs and medicaments taken internally: Secondary | ICD-10-CM | POA: Diagnosis not present

## 2014-05-05 DIAGNOSIS — I2510 Atherosclerotic heart disease of native coronary artery without angina pectoris: Secondary | ICD-10-CM | POA: Diagnosis present

## 2014-05-05 DIAGNOSIS — E785 Hyperlipidemia, unspecified: Secondary | ICD-10-CM | POA: Diagnosis present

## 2014-05-05 DIAGNOSIS — I4729 Other ventricular tachycardia: Secondary | ICD-10-CM

## 2014-05-05 DIAGNOSIS — Y92234 Operating room of hospital as the place of occurrence of the external cause: Secondary | ICD-10-CM

## 2014-05-05 DIAGNOSIS — T508X5A Adverse effect of diagnostic agents, initial encounter: Secondary | ICD-10-CM | POA: Diagnosis not present

## 2014-05-05 DIAGNOSIS — Z720 Tobacco use: Secondary | ICD-10-CM | POA: Diagnosis present

## 2014-05-05 DIAGNOSIS — I214 Non-ST elevation (NSTEMI) myocardial infarction: Principal | ICD-10-CM | POA: Diagnosis present

## 2014-05-05 DIAGNOSIS — I2584 Coronary atherosclerosis due to calcified coronary lesion: Secondary | ICD-10-CM | POA: Diagnosis present

## 2014-05-05 DIAGNOSIS — E780 Pure hypercholesterolemia: Secondary | ICD-10-CM | POA: Diagnosis present

## 2014-05-05 DIAGNOSIS — R52 Pain, unspecified: Secondary | ICD-10-CM

## 2014-05-05 HISTORY — PX: PERCUTANEOUS CORONARY STENT INTERVENTION (PCI-S): SHX5485

## 2014-05-05 HISTORY — PX: LEFT HEART CATHETERIZATION WITH CORONARY ANGIOGRAM: SHX5451

## 2014-05-05 LAB — BASIC METABOLIC PANEL
Anion gap: 13 (ref 5–15)
BUN: 20 mg/dL (ref 6–23)
CALCIUM: 9.9 mg/dL (ref 8.4–10.5)
CHLORIDE: 104 meq/L (ref 96–112)
CO2: 24 mEq/L (ref 19–32)
CREATININE: 0.76 mg/dL (ref 0.50–1.35)
GFR calc non Af Amer: >90 mL/min (ref >90)
Glucose, Bld: 110 mg/dL — ABNORMAL HIGH (ref 70–99)
Potassium: 4.4 mEq/L (ref 3.7–5.3)
Sodium: 141 mEq/L (ref 137–147)

## 2014-05-05 LAB — RAPID URINE DRUG SCREEN, HOSP PERFORMED
Amphetamines: NOT DETECTED
Barbiturates: NOT DETECTED
Benzodiazepines: NOT DETECTED
COCAINE: NOT DETECTED
Opiates: NOT DETECTED
TETRAHYDROCANNABINOL: NOT DETECTED

## 2014-05-05 LAB — CBC
HCT: 39.1 % (ref 39.0–52.0)
Hemoglobin: 13.5 g/dL (ref 13.0–17.0)
MCH: 30.4 pg (ref 26.0–34.0)
MCHC: 34.5 g/dL (ref 30.0–36.0)
MCV: 88.1 fL (ref 78.0–100.0)
PLATELETS: 309 10*3/uL (ref 150–400)
RBC: 4.44 MIL/uL (ref 4.22–5.81)
RDW: 14.1 % (ref 11.5–15.5)
WBC: 7.1 10*3/uL (ref 4.0–10.5)

## 2014-05-05 LAB — I-STAT TROPONIN, ED
TROPONIN I, POC: 0.03 ng/mL (ref 0.00–0.08)
Troponin i, poc: 0.31 ng/mL (ref 0.00–0.08)

## 2014-05-05 SURGERY — LEFT HEART CATHETERIZATION WITH CORONARY ANGIOGRAM
Anesthesia: LOCAL | Laterality: Right

## 2014-05-05 MED ORDER — SODIUM CHLORIDE 0.9 % IV SOLN: 1.0000 mL/kg/h | INTRAVENOUS | Status: AC

## 2014-05-05 MED ORDER — ACETAMINOPHEN 325 MG PO TABS: 650.0000 mg | ORAL_TABLET | ORAL | Status: DC | PRN

## 2014-05-05 MED ORDER — VERAPAMIL HCL 2.5 MG/ML IV SOLN
INTRAVENOUS | Status: AC
  Filled 2014-05-05: qty 2

## 2014-05-05 MED ORDER — FENTANYL CITRATE 0.05 MG/ML IJ SOLN
INTRAMUSCULAR | Status: AC
  Filled 2014-05-05: qty 2

## 2014-05-05 MED ORDER — NITROGLYCERIN 0.4 MG SL SUBL: 0.4000 mg | SUBLINGUAL_TABLET | SUBLINGUAL | Status: DC | PRN

## 2014-05-05 MED ORDER — MIDAZOLAM HCL 2 MG/2ML IJ SOLN
INTRAMUSCULAR | Status: AC
  Filled 2014-05-05: qty 2

## 2014-05-05 MED ORDER — SODIUM CHLORIDE 0.9 % IV SOLN
Freq: Once | INTRAVENOUS | Status: AC
  Administered 2014-05-05: 20:00:00 via INTRAVENOUS

## 2014-05-05 MED ORDER — NITROGLYCERIN 1 MG/10 ML FOR IR/CATH LAB
INTRA_ARTERIAL | Status: AC
  Filled 2014-05-05: qty 10

## 2014-05-05 MED ORDER — ASPIRIN EC 81 MG PO TBEC
81.0000 mg | DELAYED_RELEASE_TABLET | Freq: Every day | ORAL | Status: DC
  Administered 2014-05-06 – 2014-05-08 (×3): 81 mg via ORAL
  Filled 2014-05-05 (×4): qty 1

## 2014-05-05 MED ORDER — SODIUM CHLORIDE 0.9 % IV SOLN: 250.0000 mL | INTRAVENOUS | Status: DC | PRN

## 2014-05-05 MED ORDER — DIPHENHYDRAMINE HCL 50 MG/ML IJ SOLN
INTRAMUSCULAR | Status: AC
  Filled 2014-05-05: qty 1

## 2014-05-05 MED ORDER — SODIUM CHLORIDE 0.9 % IJ SOLN: 3.0000 mL | INTRAMUSCULAR | Status: DC | PRN

## 2014-05-05 MED ORDER — ONDANSETRON HCL 4 MG/2ML IJ SOLN: 4.0000 mg | Freq: Four times a day (QID) | INTRAMUSCULAR | Status: DC | PRN

## 2014-05-05 MED ORDER — HEPARIN (PORCINE) IN NACL 100-0.45 UNIT/ML-% IJ SOLN
650.0000 [IU]/h | INTRAMUSCULAR | Status: DC
Start: 2014-05-05 — End: 2014-05-05
  Administered 2014-05-05: 650 [IU]/h via INTRAVENOUS
  Filled 2014-05-05: qty 250

## 2014-05-05 MED ORDER — TICAGRELOR 90 MG PO TABS
ORAL_TABLET | ORAL | Status: AC
  Filled 2014-05-05: qty 2

## 2014-05-05 MED ORDER — BIVALIRUDIN 250 MG IV SOLR
INTRAVENOUS | Status: AC
Start: 2014-05-05 — End: 2014-05-05
  Filled 2014-05-05: qty 250

## 2014-05-05 MED ORDER — ASPIRIN 300 MG RE SUPP: 300.0000 mg | RECTAL | Status: DC

## 2014-05-05 MED ORDER — CLOPIDOGREL BISULFATE 300 MG PO TABS
300.0000 mg | ORAL_TABLET | Freq: Once | ORAL | Status: DC
Start: 2014-05-05 — End: 2014-05-05

## 2014-05-05 MED ORDER — ASPIRIN 81 MG PO CHEW: 324.0000 mg | CHEWABLE_TABLET | ORAL | Status: DC

## 2014-05-05 MED ORDER — SODIUM CHLORIDE 0.9 % IJ SOLN: 3.0000 mL | Freq: Two times a day (BID) | INTRAMUSCULAR | Status: DC

## 2014-05-05 MED ORDER — ASPIRIN 81 MG PO CHEW
324.0000 mg | CHEWABLE_TABLET | Freq: Once | ORAL | Status: AC
  Administered 2014-05-05: 324 mg via ORAL
  Filled 2014-05-05: qty 4

## 2014-05-05 MED ORDER — LIDOCAINE HCL (PF) 1 % IJ SOLN
INTRAMUSCULAR | Status: AC
  Filled 2014-05-05: qty 30

## 2014-05-05 MED ORDER — ISOSORBIDE MONONITRATE ER 30 MG PO TB24
30.0000 mg | ORAL_TABLET | Freq: Two times a day (BID) | ORAL | Status: DC
  Administered 2014-05-06 – 2014-05-08 (×5): 30 mg via ORAL
  Filled 2014-05-05 (×6): qty 1

## 2014-05-05 MED ORDER — HEPARIN SODIUM (PORCINE) 1000 UNIT/ML IJ SOLN
INTRAMUSCULAR | Status: AC
  Filled 2014-05-05: qty 1

## 2014-05-05 MED ORDER — NITROGLYCERIN IN D5W 200-5 MCG/ML-% IV SOLN
0.0000 ug/min | INTRAVENOUS | Status: DC
  Administered 2014-05-05: 10 ug/min via INTRAVENOUS
  Filled 2014-05-05: qty 250

## 2014-05-05 MED ORDER — NICOTINE 14 MG/24HR TD PT24
14.0000 mg | MEDICATED_PATCH | Freq: Once | TRANSDERMAL | Status: DC
  Filled 2014-05-05 (×2): qty 1

## 2014-05-05 MED ORDER — SODIUM CHLORIDE 0.9 % IJ SOLN
3.0000 mL | Freq: Two times a day (BID) | INTRAMUSCULAR | Status: DC
  Administered 2014-05-06 – 2014-05-07 (×3): 3 mL via INTRAVENOUS

## 2014-05-05 MED ORDER — HEPARIN (PORCINE) IN NACL 2-0.9 UNIT/ML-% IJ SOLN
INTRAMUSCULAR | Status: AC
  Filled 2014-05-05: qty 1500

## 2014-05-05 MED ORDER — MORPHINE SULFATE 4 MG/ML IJ SOLN
4.0000 mg | INTRAMUSCULAR | Status: AC
  Administered 2014-05-05: 4 mg via INTRAVENOUS
  Filled 2014-05-05: qty 1

## 2014-05-05 MED ORDER — CLOPIDOGREL BISULFATE 75 MG PO TABS
75.0000 mg | ORAL_TABLET | Freq: Every day | ORAL | Status: DC
  Administered 2014-05-06 – 2014-05-08 (×3): 75 mg via ORAL
  Filled 2014-05-05 (×4): qty 1

## 2014-05-05 MED ORDER — HEPARIN BOLUS VIA INFUSION
3000.0000 [IU] | Freq: Once | INTRAVENOUS | Status: AC
  Administered 2014-05-05: 3000 [IU] via INTRAVENOUS
  Filled 2014-05-05: qty 3000

## 2014-05-05 MED ORDER — METHYLPREDNISOLONE SODIUM SUCC 125 MG IJ SOLR
INTRAMUSCULAR | Status: AC
  Filled 2014-05-05: qty 2

## 2014-05-05 NOTE — CV Procedure (Signed)
Cardiac Catheterization Procedure Note  Name: Scott Berry MRN: [id number] DOB: 1961/05/20  Procedure: Left Heart Cath, Selective Coronary Angiography, LV angiography, attempted PTCA of the RCA  Indication: NSTEMI, ongoing ischemic symptoms.   Procedural Details:  The right wrist was prepped, draped, and anesthetized with 1% lidocaine. Using the modified Seldinger technique, a 5/6 French Slender sheath was introduced into the right radial artery. 3 mg of verapamil was administered through the sheath, weight-based unfractionated heparin was administered intravenously. Standard Judkins catheters were used for selective coronary angiography and left ventriculography. Catheter exchanges were performed over an exchange length guidewire.  PROCEDURAL FINDINGS Hemodynamics: AO 94/58 LV 94/9   Coronary angiography: Coronary dominance: right  Left mainstem: the left main is moderately calcified. The distal left main has 30% stenosis.  Left anterior descending (LAD): the LAD is severely calcified. The proximal LAD has mild diffuse stenosis. The mid LAD has 60-70% stenosis. This is in an area of very severe calcification. Further down in the mid LAD, the vessel is widely patent. The distal LAD is widely patent. The first diagonal branch is large in caliber with no stenosis.  Left circumflex (LCx): the circumflex is mildly calcified. The vessel supplies 2 obtuse marginal branches and 2 posterior lateral branches. There is minor nonobstructive disease present. The mid circumflex is heavily calcified.  Right coronary artery (RCA): the vessel is severely calcified. The RCA is dominant. There is a total occlusion in the proximal RCA. There is extremely severe diffuse calcium through the mid RCA and another focal area of heavy severe calcium in the distal RCA where there is no flow. The PDA is collateralized from the left coronary artery.  Left ventriculography: the basal inferior wall is severely  hypokinetic. The other LV wall segments contract normally. The estimated LVEF is 50-55%. There is no significant mitral regurgitation.  PCI Note:  Following the diagnostic procedure, the decision was made to proceed with PCI of the RCA.  The vessel had the appearance of a possible chronic total occlusion. However, it was difficult to tell for certain. The patient's clinical presentation is one of acute coronary syndrome with ongoing chest pain and elevated troponin. He was given brilinta 1880 mg on the table.  I elected to attempt PCI of the RCA. Weight-based bivalirudin was given for anticoagulation. Once a therapeutic ACT was achieved, a 6 [place] JR 4 guide catheter was inserted.  A cougar coronary guidewire was initially attempted. This was unsuccessful in crossing the lesion. I changed out to a 300 cm Fielder XT wire with a 2.0 balloon for backup. I was able to cross the lesion but not able to advance the wire to the distal reconstitution. I then attempted a 3 gram [employer] wire with a 1.25 mm OTW balloon for support but this was also unsuccessful.  At that point it was clear that this was not going to be a successful PCI due to inability to cross the occlusion with a wire and the procedure was discontinued. The patient tolerated the procedure well. There were no immediate procedural complications. A TR band was used for radial hemostasis. The patient was transferred to the post catheterization recovery area for further monitoring.  PCI Data: Vessel - RCA/Segment - proximal Percent Stenosis (pre)  100 TIMI-flow 0 Stent none (unsuccessful PCI) Percent Stenosis (post) 100 TIMI-flow (post) 0  Estimated Blood Loss: minimal  Complications: Dye reaction/allergy. The patient developed an urticarial rash limited to his right arm but also had diffuse pruritis. He  was given benadryl 25 mg and solumedrol 125 mg IV. No respiratory symptoms.  Final Conclusions:   1. 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 Moderate diffuse mid-LAD stenosis with severe calcification 3. Patent LCx with mild nonobstructive disease 4. Mild segmental LV systolic dysfunction with LVEF 50-55%   Recommendations:  Med Rx with ASA/plavix/anti-anginal drugs. Will review films with colleagues to consider revascularization options. Cycle cardiac enzymes.  [name] MD, [facility] 05/05/2014, 10:02 PM

## 2014-05-05 NOTE — H&P (Signed)
Patient ID: Almeta Monasimber K Cherney MRN: [id number], DOB/AGE: [birth year]   Admit date: 05/05/2014   Primary Physician: Donato SchultzSKAINS, MARK, MD Primary Cardiologist: Dr. Anne FuSkains  Pt. Profile:  53 year old Caucasian male with past medical history of hypertension, hyperlipidemia, Tobacco abuse, remote history of drug use   Problem List  Past Medical History  Diagnosis Date  . Hypertension   . Sleep apnea 1995    not a problem now  . Shortness of breath     if don't take medicines  . 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 Polysubstance abuse in past 08/15/2011  . Personal history of colonic polyps 08/15/2011  . Tobacco abuse 08/15/2011  . Pure hypercholesterolemia   . Coronary artery arteriosclerosis 2011    cath with long tubular heavily calcified mid LAD 60%, calcified RCA with multiple 50% stenosis, ostial left circ stenosis of 30% and calcified LM with no stenosis on medical management    Past Surgical History  Procedure Laterality Date  . Foreign body removal rectal  Oct 2011  . Colon surgery  14Jun2012    colectomy/diverting ileostomy - colon perf  . Ileo loop diversion  06/21/2011    Procedure: Takedown of loop ileostomy  . Loop ileostomy takedown  20Dec2012    Procedure: Takedown of loop ileostomy  . Cardiac catheterization  09/10/2009    Results in chart     Allergies  Allergies  Allergen Reactions  . Penicillins Anaphylaxis    HPI  The patient is a 53 year old Caucasian male with past medical history of hypertension, hyperlipidemia, Tobacco abuse, remote history of drug use. Patient's last cardiac catheterization on 09/12/2009 showed EF 60%, diffusely calcified RCA with lesions up to 50%, 30% proximal LAD, 60% mid LAD, 30% ostial left circumflex stenosis. According to the patient, for the last year, he has been having intermittent chest discomfort not necessarily related to exertion. The chest discomfort usually last seconds at a time and  has sudden onset and quick resolution. 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 He has not noted any exertional chest discomfort, however he does have occasional dyspnea on exertion. He denies any significant lower extremity edema, orthopnea or paroxysmal nocturnal dyspnea recently. The last time he saw Dr. Anne FuSkains was on 04/26/2014 at which time he was complaining of episodic chest pain on and off.  Patient was at a job fair in the morning of 05/05/2014 when he experienced sudden onset of chest pain while walking. The chest pain persisted throughout the rest of the day, although he told the ED physician the chest pain lasted seconds at a time and keep recurring. [facility], his blood pressure was 112/83. O2 saturation 98% on room air. Heart rate 100. In the ED patient was noted to have episode of nonsustained VT. Initial troponin was negative, repeat point-of-care troponin was mildly elevated at 0.31. Cardiology was consulted for chest pain. At this time, patient had persistent chest pain for the past 10 hours since 9:30 AM this morning.  Home Medications  Prior to Admission medications   Medication Sig Start Date End Date Taking? Authorizing Provider  isosorbide mononitrate (IMDUR) 30 MG 24 hr tablet Take 30 mg by mouth 2 (two) times daily.   Yes Historical Provider, MD    Family History  Family History  Problem Relation Age of Onset  . Stroke Father   . Stroke Mother     Social History  History   Social History  . Marital Status: Divorced    Spouse Name: N/A    Number of Children: N/A  . Years of Education: N/A   Occupational History  . Not on file.   Social History Main Topics  . Smoking status: Current Every Day Smoker -- 0.50 packs/day    Types: Cigarettes    Last Attempt to Quit: 07/03/2007  . Smokeless tobacco: Never Used  . Alcohol Use: No     Comment: Recovered alcoholic x 6 years  . Drug Use: No     Comment:  None in  about 25 years; But has been "working around acetone"  . Sexual Activity: Not on file   Other Topics Concern  . Not on file   Social History Narrative     Review of Systems General:  No chills, fever, night sweats or weight changes.  Cardiovascular:  No dyspnea on exertion, edema, orthopnea, palpitations, paroxysmal nocturnal dyspnea. +chest pain radiate to the back Dermatological: No rash, lesions/masses Respiratory: No dyspnea. Chronic cough Urologic: No hematuria, dysuria Abdominal:   No nausea, vomiting, diarrhea, bright red blood per rectum, melena, or hematemesis Neurologic:  No visual changes, wkns, changes in mental status. All other systems reviewed and are otherwise negative except as noted above.  Physical Exam  Blood pressure 106/72, pulse 91, temperature 98.1 F (36.7 C), temperature source Oral, resp. rate 18, height 5\' 9"  (1.753 m), weight 128 lb (58.06 kg), SpO2 96 %.  General: Pleasant, NAD Psych: Normal affect. Neuro: Alert and oriented X 3. Moves all extremities spontaneously. HEENT: Normal  Neck: Supple without bruits or JVD. Lungs:  Resp regular and unlabored, CTA. Heart: RRR no s3, s4, or murmurs. Abdomen: Soft, non-tender, non-distended, BS + x 4.  Extremities: No clubbing, cyanosis or edema. DP/PT/Radials 2+ and equal bilaterally.  Labs  Troponin [facility](Point of Care Test)  Recent Labs  05/05/14 1646  TROPIPOC 0.31*   No results for input(s): CKTOTAL, CKMB, TROPONINI in the last 72 hours. Lab Results  Component Value Date   WBC 7.1 05/05/2014   HGB 13.5 05/05/2014   HCT 39.1 05/05/2014   MCV 88.1 05/05/2014   PLT 309 05/05/2014     Recent Labs Lab 05/05/14 1232  NA 141  K 4.4  CL 104  CO2 24  BUN 20  CREATININE 0.76  CALCIUM 9.9  GLUCOSE 110*   No results found for: CHOL, HDL, LDLCALC, TRIG No results found for: DDIMER   Radiology/Studies  Dg Chest 2 View  05/05/2014   CLINICAL DATA:  Chest pain for a couple of days  EXAM: CHEST   2 VIEW  COMPARISON:  06/25/2011  FINDINGS: The cardiac silhouette and mediastinal contours are within normal limits.  The lungs are mildly hyperinflated.  There is no focal airspace consolidation, pleural effusion or pneumothorax.  There is no overt pulmonary edema.  There is no acute osseous abnormality.  Mild degenerative changes of the spine are present.  IMPRESSION: Mild hyperinflation without focal airspace consolidation.   Electronically Signed   By: Fannie KneeKenneth  Crosby   On: 05/05/2014 15:55    ECG  NSR with HR 70s, no significant ST-T wave changes  ASSESSMENT AND PLAN  1. NSTEMI  - trend serial trop, will need cardiac catheterization given prior h/o heavily calcified CAD  - benefit and risk explained to the patient include bleeding, vascular injury, kidney injury, MI, stroke and possible death. He display understanding and agree to proceed.   2. NSVT: noted in  ED  - electrolyte stable  3. CAD   - Last cath 09/12/2009 EF 60%, diffusely calcified RCA with lesions up to 50%, 30% proximal LAD, 60% mid LAD, 30% ostial left circumflex stenosis  4. Hypertension - BP soft in the low 100's 5. Hyperlipidemia - not on statin 6. Tobacco abuse - he continues to smoke 7. Remote h/o drug abuse  Signed, Azalee Course, [place] 05/05/2014, 8:08 PM   I have personally seen, interviewed and examined.  I have reviewed the chart as well.  Agree with note as outlined by Azalee Course, PA-C.  He has a history of moderate ASCAD of the LAD and RCA with heavily calcified vessels by cath in 2011.  Has been having intermittent CP recently and had onset of chest pressure while walking this am which has been constant since then and is not at 6/10.  His initial POC trop was normal but second one has come back at 0.31.  He is on Heparin gtt and continues to complain of CP.  His SBP was but now is .  He had a 10 beat run of VTACH in the ER and was asymptomatic.  Will admit to step down.  Continue IV Heparin gtt.   Will try low dose IV NTG but cautiously due to soft BP.  Will hold on BB due to soft BP.  He needs to have counseling in smoking cessation.  Will get a 2D echo in am to assess LVF.  Discussed case with Dr. Excell Seltzer.  Since the patient continues to have chest pain with mildly elevated enzymes and now evidence of ventricular tachycardia he needs cardiac cath.  His BP is soft which is going to limit meds such as beta blockers to treat VT and NTG for CP.  Will go ahead and proceed with cath tonight.  Will hold off on Plavix or Brilinta given high risk of progressive 3 vessel CAD which may require CABG.  Signed: Armanda Magic, MD [facility] 05/05/2014

## 2014-05-05 NOTE — ED Notes (Signed)
Lauren, PA notified of abnormal lab test results

## 2014-05-05 NOTE — ED Notes (Signed)
Patient states started having mid chest pain this morning that comes and goes.  Patient states that he had radiation to his back, but denies any other symptoms.   Patient states he is chest pain free at this time.   Patient walked from 3 blocks away to hospital.

## 2014-05-05 NOTE — Progress Notes (Signed)
ANTICOAGULATION CONSULT NOTE - Initial Consult  Pharmacy Consult for Heparin  Indication: chest pain/ACS  Allergies  Allergen Reactions  . Penicillins Anaphylaxis    Patient Measurements: Height: 5\' 9"  (175.3 cm) Weight: 128 lb (58.06 kg) IBW/kg (Calculated) : 70.7 Heparin Dosing Weight: 58 kg   Vital Signs: Temp: 98.1 F (36.7 C) (11/04 1231) Temp Source: Oral (11/04 1231) BP: 110/67 mmHg (11/04 1715) Pulse Rate: 72 (11/04 1715)  Labs:  Recent Labs  05/05/14 1232  HGB 13.5  HCT 39.1  PLT 309  CREATININE 0.76    Estimated Creatinine Clearance: 85.7 mL/min (by C-G formula based on Cr of 0.76).   Medical History: Past Medical History  Diagnosis Date  . Hypertension   . Sleep apnea 1995    not a problem now  . Shortness of breath     if don't take medicines  . 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 Coronary artery arteriosclerosis   . Polysubstance abuse in past 08/15/2011  . Personal history of colonic polyps 08/15/2011  . Tobacco abuse 08/15/2011  . Pure hypercholesterolemia     Medications:   (Not in a hospital admission)  Assessment: [age] YOM with complaint of chest pain since 1000 today. Troponin elevated at 0.31. Pharmacy consulted to start heparin infusion for ACS. H/H and Plt wnl. Pt is not on any anticoagulation prior to admission  Goal of Therapy:  Heparin level 0.3-0.7 units/ml Monitor platelets by anticoagulation protocol: Yes   Plan:  -Heparin 3000 unit bolus followed by 650 units/hr  -F/u 6 hours HL -Monitor daily HL, CBC, and s/s of bleeding   Vinnie LevelBenjamin Ortha Metts, PharmD.  Clinical Pharmacist Pager [phone]

## 2014-05-05 NOTE — ED Notes (Signed)
Ambulated pt with pulse ox. o2 98% room air and heart rate 107

## 2014-05-05 NOTE — ED Notes (Signed)
Patient transported to X-ray 

## 2014-05-05 NOTE — Interval H&P Note (Signed)
History and Physical Interval Note:  05/05/2014 9:54 PM  Scott Berry  has presented today for surgery, with the diagnosis of non-stemi  The various methods of treatment have been discussed with the patient and family. After consideration of risks, benefits and other options for treatment, the patient has consented to  Procedure(s) with comments: LEFT HEART CATHETERIZATION WITH CORONARY ANGIOGRAM (N/A) PERCUTANEOUS CORONARY STENT INTERVENTION (PCI-S) (Right) - Prox RCA as a surgical intervention .  The patient's history has been reviewed, patient examined, no change in status, stable for surgery.  I have reviewed the patient's chart and labs.  Questions were answered to the patient's satisfaction.   Cath Lab Visit (complete for each Cath Lab visit)  Clinical Evaluation Leading to the Procedure:   ACS: Yes.    Non-ACS:    Anginal Classification: CCS IV  Anti-ischemic medical therapy: No Therapy  Non-Invasive Test Results: No non-invasive testing performed  Prior CABG: No previous CABG        [name]

## 2014-05-05 NOTE — ED Notes (Signed)
Pt transported to cath lab by RN. 

## 2014-05-05 NOTE — ED Notes (Signed)
Zoll pads applied 

## 2014-05-05 NOTE — ED Provider Notes (Signed)
CSN: [id number]     Arrival date & time 05/05/14  1220 History   First MD Initiated Contact with Patient 05/05/14 1532     Chief Complaint  Patient presents with  . Chest Pain     (Consider location/radiation/quality/duration/timing/severity/associated sxs/prior Treatment) HPI Comments: Scott Berry is a 53 y.o. male with history of hyperlipidemia, tobacco abuse, prior drug use, hypertension with cardiac catheterization in March 2011 demonstrating moderate diffuse coronary artery disease presenting to the emergency room with a chief complaint of chest pain since 1000 today. Patient reports onset of chest discomfort with ambulation. He describes the chest discomfort as sharp pain with radiation into back, lasting seconds,self resolved. Patient reports one episode of chest discomfort today. He reports chest discomfort for several months, seen by cardiology for similar complaints. He reports he was able to walk several miles an emergency department after chest discomfort without reoccurrence. Patient reports chronic chest tightness. Patient denies palpitation, lower extremity edema, nausea, vomiting, diarrhea, abdominal pain, cough. Denies cocaine or IV drug use.  Denies family history of early MI in first degree realitives. He reports a history of palpitations, stating that he never brought them up with his cardiologist in the past. Denies palpitations today.    Cardiologist: Donato SchultzMark Skains  Patient is a 53 y.o. male presenting with chest pain. The history is provided by the patient. No language interpreter was used.  Chest Pain Associated symptoms: shortness of breath   Associated symptoms: no abdominal pain, no diaphoresis, no fever, no nausea and no palpitations     Past Medical History  Diagnosis Date  . Hypertension   . Sleep apnea 1995    not a problem now  . Shortness of breath     if don't take medicines  . Perforation of colon - s/p Sigmoid colectomy / diverting ileostomy 01/30/2011   . Coronary artery arteriosclerosis   . Polysubstance abuse in past 08/15/2011  . Personal history of colonic polyps 08/15/2011  . Tobacco abuse 08/15/2011  . Pure hypercholesterolemia    Past Surgical History  Procedure Laterality Date  . Foreign body removal rectal  Oct 2011  . Colon surgery  14Jun2012    colectomy/diverting ileostomy - colon perf  . Cardiac catheterization  09/10/2009    Results in chart  . Ileo loop diversion  06/21/2011    Procedure: Takedown of loop ileostomy  . Loop ileostomy takedown  20Dec2012    Procedure: Takedown of loop ileostomy   No family history on file. History  Substance Use Topics  . Smoking status: Current Every Day Smoker -- 0.50 packs/day    Types: Cigarettes    Last Attempt to Quit: 07/03/2007  . Smokeless tobacco: Never Used  . Alcohol Use: No     Comment: Recovered alcoholic x 6 years    Review of Systems  Constitutional: Negative for fever, chills and diaphoresis.  Respiratory: Positive for chest tightness and shortness of breath.   Cardiovascular: Positive for chest pain. Negative for palpitations and leg swelling.  Gastrointestinal: Negative for nausea, abdominal pain, diarrhea and constipation.      Allergies  Penicillins  Home Medications   Prior to Admission medications   Medication Sig Start Date End Date Taking? Authorizing Provider  isosorbide mononitrate (IMDUR) 30 MG 24 hr tablet take 1 tablet by mouth twice a day 04/26/14   Donato SchultzMark Skains, MD   BP 112/83 mmHg  Pulse 102  Temp(Src) 98.1 F (36.7 C) (Oral)  Resp 22  Ht 5\' 9"  (1.753 m)  Wt 128 lb (58.06 kg)  BMI 18.89 kg/m2  SpO2 98% Physical Exam  Constitutional: He is oriented to person, place, and time. He appears well-developed and well-nourished. No distress.  Cardiovascular: Tachycardia present.   Pulses:      Radial pulses are 2+ on the right side, and 2+ on the left side.  No lower extremity edema  Pulmonary/Chest: Effort normal and breath sounds  normal. No respiratory distress. He has no decreased breath sounds. He has no wheezes. He has no rales. He exhibits no tenderness.  Neurological: He is alert and oriented to person, place, and time.  Skin: He is not diaphoretic.  Nursing note and vitals reviewed.   ED Course  Procedures (including critical care time) Labs Review Results for orders placed or performed during the hospital encounter of 05/05/14  CBC  Result Value Ref Range   WBC 7.1 4.0 - 10.5 K/uL   RBC 4.44 4.22 - 5.81 MIL/uL   Hemoglobin 13.5 13.0 - 17.0 g/dL   HCT 74.239.1 59.539.0 - 63.852.0 %   MCV 88.1 78.0 - 100.0 fL   MCH 30.4 26.0 - 34.0 pg   MCHC 34.5 30.0 - 36.0 g/dL   RDW 75.614.1 43.311.5 - 29.515.5 %   Platelets 309 150 - 400 K/uL  Basic metabolic panel  Result Value Ref Range   Sodium 141 137 - 147 mEq/L   Potassium 4.4 3.7 - 5.3 mEq/L   Chloride 104 96 - 112 mEq/L   CO2 24 19 - 32 mEq/L   Glucose, Bld 110 (H) 70 - 99 mg/dL   BUN 20 6 - 23 mg/dL   Creatinine, Ser 1.880.76 0.50 - 1.35 mg/dL   Calcium 9.9 8.4 - 41.610.5 mg/dL   GFR calc non Af Amer >90 >90 mL/min   GFR calc Af Amer >90 >90 mL/min   Anion gap 13 5 - 15  Drug screen panel, emergency  Result Value Ref Range   Opiates NONE DETECTED NONE DETECTED   Cocaine NONE DETECTED NONE DETECTED   Benzodiazepines NONE DETECTED NONE DETECTED   Amphetamines NONE DETECTED NONE DETECTED   Tetrahydrocannabinol NONE DETECTED NONE DETECTED   Barbiturates NONE DETECTED NONE DETECTED  I-stat troponin, ED ([facility])  Result Value Ref Range   Troponin i, poc 0.03 0.00 - 0.08 ng/mL   Comment 3          I-stat troponin, ED  Result Value Ref Range   Troponin i, poc 0.31 (HH) 0.00 - 0.08 ng/mL   Comment NOTIFIED PHYSICIAN    Comment 3           Dg Chest 2 View  05/05/2014   CLINICAL DATA:  Chest pain for a couple of days  EXAM: CHEST  2 VIEW  COMPARISON:  06/25/2011  FINDINGS: The cardiac silhouette and mediastinal contours are within normal limits.  The lungs are mildly  hyperinflated.  There is no focal airspace consolidation, pleural effusion or pneumothorax.  There is no overt pulmonary edema.  There is no acute osseous abnormality.  Mild degenerative changes of the spine are present.  IMPRESSION: Mild hyperinflation without focal airspace consolidation.   Electronically Signed   By: Fannie KneeKenneth  Crosby   On: 05/05/2014 15:55     EKG Interpretation   Date/Time:  Wednesday May 05 2014 12:26:39 EST Ventricular Rate:  95 PR Interval:  120 QRS Duration: 80 QT Interval:  344 QTC Calculation: 432 R Axis:   86 Text Interpretation:  Normal sinus rhythm Nonspecific ST abnormality  Abnormal ECG anterior Twave inversion, change from prior. Need repeat for  inferior leads. abnormal Confirmed by Donnald Garre, MD, Lebron Conners [phone]) on  05/06/2014 1:06:20 AM      MDM   Final diagnoses:  NSTEMI  V-TACH  Patient is a 53 year old male with diffuse coronary artery disease a cath in 2011 presents with chest pain lasting seconds with radiation to back. Also complains of chest tightness, and shortness of breath, chronic and unchanged. Patient has multiple risk factors for MI, ASA given in ED. InitalTroponin negative. Chest x-ray negative for acute findings. Patients oxygen saturation 94% on room air on arrival, with ambulation 98%. Reevaluation patient resting comfortable in room denies further chest pain.  1712 patient's delta troponin positive at 0.31, discussed with Dr. Clarice Pole who agrees to see the patient. Cardiology paged. Heparin started. Upon entry into the room patient's cardiac arrhythmia alarm was going off. Review of all arms on cardiac monitor shows V-tach, 9 consecutive Ventricular contractions. Re-eval pt still chest pain free discussed results and plan. Pt requesting to be allowed to go outside for a cigarette.  Discussed patient history, condition with Dr.Hao, plans to consult attending Dr. Mayford Knife. Reevaluation patient resting comfortably in room reports mild  chest pressure. Discussed patient history, condition with Dr. Mayford Knife who agrees to admit the patient.   Mellody Drown, PA-C 05/06/14 6045  Arby Barrette, MD 05/10/14 1027

## 2014-05-06 ENCOUNTER — Encounter (HOSPITAL_COMMUNITY): Payer: Self-pay | Admitting: *Deleted

## 2014-05-06 DIAGNOSIS — I059 Rheumatic mitral valve disease, unspecified: Secondary | ICD-10-CM

## 2014-05-06 LAB — BASIC METABOLIC PANEL
ANION GAP: 14 (ref 5–15)
BUN: 17 mg/dL (ref 6–23)
CALCIUM: 9 mg/dL (ref 8.4–10.5)
CO2: 20 mEq/L (ref 19–32)
CREATININE: 0.75 mg/dL (ref 0.50–1.35)
Chloride: 107 mEq/L (ref 96–112)
Glucose, Bld: 152 mg/dL — ABNORMAL HIGH (ref 70–99)
Potassium: 4 mEq/L (ref 3.7–5.3)
SODIUM: 141 meq/L (ref 137–147)

## 2014-05-06 LAB — CBC
HCT: 37.9 % — ABNORMAL LOW (ref 39.0–52.0)
Hemoglobin: 12.6 g/dL — ABNORMAL LOW (ref 13.0–17.0)
MCH: 29.4 pg (ref 26.0–34.0)
MCHC: 33.2 g/dL (ref 30.0–36.0)
MCV: 88.3 fL (ref 78.0–100.0)
PLATELETS: 312 10*3/uL (ref 150–400)
RBC: 4.29 MIL/uL (ref 4.22–5.81)
RDW: 14.3 % (ref 11.5–15.5)
WBC: 8.3 10*3/uL (ref 4.0–10.5)

## 2014-05-06 LAB — LIPID PANEL
CHOLESTEROL: 149 mg/dL (ref 0–200)
HDL: 55 mg/dL (ref >39)
LDL Cholesterol: 85 mg/dL (ref 0–99)
Total CHOL/HDL Ratio: 2.7 RATIO
Triglycerides: 47 mg/dL (ref <150)
VLDL: 9 mg/dL (ref 0–40)

## 2014-05-06 LAB — HEPARIN LEVEL (UNFRACTIONATED): Heparin Unfractionated: 0.23 IU/mL — ABNORMAL LOW (ref 0.30–0.70)

## 2014-05-06 LAB — MRSA PCR SCREENING: MRSA by PCR: NEGATIVE

## 2014-05-06 LAB — TROPONIN I
TROPONIN I: 5.71 ng/mL — AB (ref <0.30)
Troponin I: 4.79 ng/mL (ref <0.30)
Troponin I: 8.49 ng/mL (ref <0.30)

## 2014-05-06 LAB — POCT ACTIVATED CLOTTING TIME
Activated Clotting Time: 248 seconds
Activated Clotting Time: 309 seconds

## 2014-05-06 MED ORDER — LORAZEPAM 2 MG/ML IJ SOLN: 1.0000 mg | Freq: Four times a day (QID) | INTRAMUSCULAR | Status: DC | PRN

## 2014-05-06 MED ORDER — NICOTINE 21 MG/24HR TD PT24
21.0000 mg | MEDICATED_PATCH | Freq: Once | TRANSDERMAL | Status: AC
Start: 2014-05-06 — End: 2014-05-07
  Administered 2014-05-06: 21 mg via TRANSDERMAL
  Filled 2014-05-06: qty 1

## 2014-05-06 MED ORDER — FOLIC ACID 1 MG PO TABS
1.0000 mg | ORAL_TABLET | Freq: Every day | ORAL | Status: DC
  Administered 2014-05-06 – 2014-05-08 (×3): 1 mg via ORAL
  Filled 2014-05-06 (×3): qty 1

## 2014-05-06 MED ORDER — METOPROLOL TARTRATE 25 MG PO TABS
25.0000 mg | ORAL_TABLET | Freq: Two times a day (BID) | ORAL | Status: DC
  Administered 2014-05-06 (×2): 25 mg via ORAL
  Filled 2014-05-06 (×4): qty 1

## 2014-05-06 MED ORDER — VITAMIN B-1 100 MG PO TABS
100.0000 mg | ORAL_TABLET | Freq: Every day | ORAL | Status: DC
  Administered 2014-05-06 – 2014-05-08 (×3): 100 mg via ORAL
  Filled 2014-05-06 (×3): qty 1

## 2014-05-06 MED ORDER — ADULT MULTIVITAMIN W/MINERALS CH
1.0000 | ORAL_TABLET | Freq: Every day | ORAL | Status: DC
  Administered 2014-05-06 – 2014-05-08 (×3): 1 via ORAL
  Filled 2014-05-06 (×3): qty 1

## 2014-05-06 MED ORDER — HEPARIN (PORCINE) IN NACL 100-0.45 UNIT/ML-% IJ SOLN
1500.0000 [IU]/h | INTRAMUSCULAR | Status: DC
  Administered 2014-05-07: 1250 [IU]/h via INTRAVENOUS
  Filled 2014-05-06 (×2): qty 250

## 2014-05-06 MED ORDER — THIAMINE HCL 100 MG/ML IJ SOLN
100.0000 mg | Freq: Every day | INTRAMUSCULAR | Status: DC
  Filled 2014-05-06 (×2): qty 1

## 2014-05-06 MED ORDER — LORAZEPAM 1 MG PO TABS
1.0000 mg | ORAL_TABLET | Freq: Four times a day (QID) | ORAL | Status: DC | PRN
  Administered 2014-05-07 – 2014-05-08 (×2): 1 mg via ORAL
  Filled 2014-05-06 (×2): qty 1

## 2014-05-06 MED ORDER — ATORVASTATIN CALCIUM 40 MG PO TABS
40.0000 mg | ORAL_TABLET | Freq: Every day | ORAL | Status: DC
  Administered 2014-05-06 – 2014-05-07 (×2): 40 mg via ORAL
  Filled 2014-05-06 (×3): qty 1

## 2014-05-06 MED ORDER — HEPARIN (PORCINE) IN NACL 100-0.45 UNIT/ML-% IJ SOLN
900.0000 [IU]/h | INTRAMUSCULAR | Status: DC
  Administered 2014-05-06: 900 [IU]/h via INTRAVENOUS
  Filled 2014-05-06 (×2): qty 250

## 2014-05-06 MED FILL — Sodium Chloride IV Soln 0.9%: INTRAVENOUS | Qty: 50 | Status: AC

## 2014-05-06 NOTE — Progress Notes (Addendum)
SUBJECTIVE:  Denies any current chest pain or SOB.  No acute complaints.  OBJECTIVE:   Vitals:   Filed Vitals:   05/06/14 0600 05/06/14 0700 05/06/14 0800 05/06/14 0900  BP: 132/75 113/43 129/76 130/86  Pulse: 70 88 86 93  Temp:  97.8 F (36.6 C)    TempSrc:  Oral    Resp: 14 21 22 15   Height:      Weight:      SpO2: 96% 96% 95% 97%   I&O's:   Intake/Output Summary (Last 24 hours) at 05/06/14 1050 Last data filed at 05/06/14 0900  Gross per 24 hour  Intake 1574.5 ml  Output   1650 ml  Net  -75.5 ml   TELEMETRY: Reviewed telemetry pt in NSR:     PHYSICAL EXAM General: Well developed, well nourished, in no acute distress Head:   Normal cephalic and atramatic  Lungs:  Clear bilaterally to auscultation. Heart:  HRRR S1 S2  No JVD.   Abdomen: abdomen soft and non-tender Extremities:  No edema.   Neuro: Alert and oriented. Psych:  Normal affect, responds appropriately Skin: No rash   LABS: Basic Metabolic Panel:  Recent Labs  16/04/9610/04/15 1232 05/06/14 0334  NA 141 141  K 4.4 4.0  CL 104 107  CO2 24 20  GLUCOSE 110* 152*  BUN 20 17  CREATININE 0.76 0.75  CALCIUM 9.9 9.0   Liver Function Tests: No results for input(s): AST, ALT, ALKPHOS, BILITOT, PROT, ALBUMIN in the last 72 hours. No results for input(s): LIPASE, AMYLASE in the last 72 hours. CBC:  Recent Labs  05/05/14 1232 05/06/14 0334  WBC 7.1 8.3  HGB 13.5 12.6*  HCT 39.1 37.9*  MCV 88.1 88.3  PLT 309 312   Cardiac Enzymes:  Recent Labs  05/06/14 0043 05/06/14 0334  TROPONINI 4.79* 5.71*   BNP: Invalid input(s): POCBNP D-Dimer: No results for input(s): DDIMER in the last 72 hours. Hemoglobin A1C: No results for input(s): HGBA1C in the last 72 hours. Fasting Lipid Panel:  Recent Labs  05/06/14 0339  CHOL 149  HDL 55  LDLCALC 85  TRIG 47  CHOLHDL 2.7   Thyroid Function Tests: No results for input(s): TSH, T4TOTAL, T3FREE, THYROIDAB in the last 72 hours.  Invalid  input(s): FREET3 Anemia Panel: No results for input(s): VITAMINB12, FOLATE, FERRITIN, TIBC, IRON, RETICCTPCT in the last 72 hours. Coag Panel:   Lab Results  Component Value Date   INR 1.03 12/14/2010    RADIOLOGY: Dg Chest 2 View  05/05/2014   CLINICAL DATA:  Chest pain for a couple of days  EXAM: CHEST  2 VIEW  COMPARISON:  06/25/2011  FINDINGS: The cardiac silhouette and mediastinal contours are within normal limits.  The lungs are mildly hyperinflated.  There is no focal airspace consolidation, pleural effusion or pneumothorax.  There is no overt pulmonary edema.  There is no acute osseous abnormality.  Mild degenerative changes of the spine are present.  IMPRESSION: Mild hyperinflation without focal airspace consolidation.   Electronically Signed   By: Fannie KneeKenneth  Crosby   On: 05/05/2014 15:55      ASSESSMENT/PLAN:   NSTEMI: likely contributed to by medication noncompliance, patient underwent cardiac cath yesterday with Dr. Excell Seltzerooper which revealed total occlusion of the RCA, moderate mid LAD stenosis, and a patent LCx with mild nonobstructive disease.  Plan for maximizing medical therapy with consideration of possible intervention after maximize medical therapy. - Dual Antiplatelet therapy: ASA and plavix - Atorvastatin 40mg  - Continue  IMDUR 30mg  BID - Start Metoprolol 25mg  BID  NSVT: no further episodes, electrolytes wnl, likely due to NSTEMI.   History of ETOH use: nursing staff concerned about patient's anxiety, suspect may have had last ETOH sooner than 1 month ago. - Start CIWA protocol monitoring. - MVI, folic acid, thiamine  Tobacco Abuse: Cessation important given her CAD -Nicotine patch  HLD: -Start high intensity statin: atorvastatin 40mg  daily  Gust RungHoffman, Erik C, DO  05/06/2014  10:50 AM   I have examined the patient and reviewed assessment and plan and discussed with patient.  Agree with above as stated.  I personally reviewed the cath films. Heavily calcified RCA  occlusion.  Heavily calcified mild LAD with moderate to severe atherosclerosis.  Currently pain free.  Will not plan any further invasive treatment.  Start beta  blocker and statin for medical therapy of CAD and antianginal therapy.  Could consier CTO PCI.  May need retrograde approach in this case. Could also consider FFR of LAD.  He needs tobacco cessation.  No further significant VT.  VARANASI,JAYADEEP S.

## 2014-05-06 NOTE — Progress Notes (Signed)
Pt awake and alert with no current complaints of chest pain or discomfort; left radial sight benign c NAB or hematoma noted; dsg CDI; pt appears restless, anxious, unable to stay still in bed; IV tubing and monitor cords wrapped up around pt from turning in bed; pt appears almost frantic to get untangled; reorientation provided, discussed monitoring devices and IV medication/protocol for pt care after a MI; pt voices understanding; nicotine patch applied and MD aware; no orders received at this time

## 2014-05-06 NOTE — Progress Notes (Signed)
Echocardiogram 2D Echocardiogram has been performed.  Dorothey BasemanReel, Tiara Maultsby M 05/06/2014, 12:35 PM

## 2014-05-06 NOTE — Progress Notes (Signed)
UR completed 

## 2014-05-06 NOTE — ED Provider Notes (Signed)
Date: 05/05/2014 16:53  Rate: 54  Rhythm: bradycardia  QRS Axis: normal  Intervals: normal  ST/T Wave abnormalities: dynamic upright V2  Conduction Disutrbances: none  Narrative Interpretation: Dynamic T wave change anterior. No inferior abnormality. Abnormal.  Medical screening examination/treatment/procedure(s) were conducted as a shared visit with non-physician practitioner(s) and myself.  I personally evaluated the patient during the encounter.   EKG Interpretation   Date/Time:  Wednesday May 05 2014 12:26:39 EST Ventricular Rate:  95 PR Interval:  120 QRS Duration: 80 QT Interval:  344 QTC Calculation: 432 R Axis:   86 Text Interpretation:  Normal sinus rhythm Nonspecific ST abnormality  Abnormal ECG anterior Twave inversion, change from prior. Need repeat for  inferior leads. abnormal Confirmed by Donnald GarrePfeiffer, MD, Lebron ConnersMarcy [phone]([id number]) on  05/06/2014 1:06:20 AM     The patient had developed chest pain that was central in nature with some radiation through to his back. He does endorse some exertional dyspnea as well.there was no associated syncopal episode.  The patient's physical examination was for a alert nontoxic male with no respiratory distress. Cardiac sounds no rub murmur gallop regular in nature. Lung sounds clear without gross wheeze or rale. Lower extremities without calf tenderness or edema.  The patient had an equivocal first EKG with anterior ST segment depression. His troponin returned positive. The patient was given aspirin and started on heparin with a cardiology consult placed. The patient did have a self-limited episode of ventricular tachycardia. Asian is admitted for a  NSTEMI.  CRITICAL CARE Performed by: Arby BarrettePfeiffer, Nayda Riesen   Total critical care time: 30  Critical care time was exclusive of separately billable procedures and treating other patients.  Critical care was necessary to treat or prevent imminent or life-threatening deterioration.  Critical care  was time spent personally by me on the following activities: development of treatment plan with patient and/or surrogate as well as nursing, discussions with consultants, evaluation of patient's response to treatment, examination of patient, obtaining history from patient or surrogate, ordering and performing treatments and interventions, ordering and review of laboratory studies, ordering and review of radiographic studies, pulse oximetry and re-evaluation of patient's condition.     Arby BarretteMarcy Victoriana Aziz, MD 05/06/14 [phone]

## 2014-05-06 NOTE — Progress Notes (Signed)
ANTICOAGULATION CONSULT NOTE  Pharmacy Consult for Heparin  Indication: chest pain/ACS  Allergies  Allergen Reactions  . Penicillins Anaphylaxis    Patient Measurements: Height: 5\' 9"  (175.3 cm) Weight: 132 lb 11.5 oz (60.2 kg) IBW/kg (Calculated) : 70.7 Heparin Dosing Weight: 58 kg   Vital Signs: Temp: 97.3 F (36.3 C) (11/04 2342) Temp Source: Oral (11/04 2342) BP: 130/87 mmHg (11/05 0030) Pulse Rate: 73 (11/05 0030)  Labs:  Recent Labs  05/05/14 1232  HGB 13.5  HCT 39.1  PLT 309  CREATININE 0.76    Estimated Creatinine Clearance: 88.8 mL/min (by C-G formula based on Cr of 0.76).  Assessment: 53 YO Male with NSTEMI s/p cath, awaiting possible PCI, for heparin  Goal of Therapy:  Heparin level 0.3-0.7 units/ml Monitor platelets by anticoagulation protocol: Yes   Plan:  Start heparin 900 units/hr at 0200  Check heparin level in 8 hours.  Geannie RisenGreg Lulamae Skorupski, PharmD, BCPS

## 2014-05-06 NOTE — Progress Notes (Signed)
CSW order received today to arrange for PCP, patient does not have a job or transportation.  Will notify RNCM of need for PCP; CSW will discuss job and transportation issues with patient.  Lorri Frederickonna T. Jaci LazierCrowder, [place]CS[place] [phone]

## 2014-05-06 NOTE — Progress Notes (Signed)
ANTICOAGULATION CONSULT NOTE - Follow Up Consult  Pharmacy Consult for Heparin Indication: chest pain/ACS  Allergies  Allergen Reactions  . Penicillins Anaphylaxis    Patient Measurements: Height: 5\' 9"  (175.3 cm) Weight: 132 lb 11.5 oz (60.2 kg) IBW/kg (Calculated) : 70.7  Vital Signs: Temp: 97 F (36.1 C) (11/05 1200) Temp Source: Oral (11/05 1200) BP: 107/64 mmHg (11/05 1200) Pulse Rate: 93 (11/05 0900)  Labs:  Recent Labs  05/05/14 1232 05/06/14 0043 05/06/14 0334 05/06/14 1120  HGB 13.5  --  12.6*  --   HCT 39.1  --  37.9*  --   PLT 309  --  312  --   HEPARINUNFRC  --   --   --  <0.10*  CREATININE 0.76  --  0.75  --   TROPONINI  --  4.79* 5.71* 8.49*    Estimated Creatinine Clearance: 88.8 mL/min (by C-G formula based on Cr of 0.75).   Medications:  Scheduled:  . aspirin EC  81 mg Oral Daily  . atorvastatin  40 mg Oral q1800  . clopidogrel  75 mg Oral Q breakfast  . folic acid  1 mg Oral Daily  . isosorbide mononitrate  30 mg Oral BID  . metoprolol tartrate  25 mg Oral BID  . multivitamin with minerals  1 tablet Oral Daily  . nicotine  21 mg Transdermal Once  . sodium chloride  3 mL Intravenous Q12H  . thiamine  100 mg Oral Daily   Or  . thiamine  100 mg Intravenous Daily   Infusions:  . heparin    . nitroGLYCERIN Stopped (05/06/14 1000)    Assessment: 53 yo M presents on 11/4 with acute onset of chest pain. Patient is on heparin gtt post-cath on 11/4 which showed extensive CAD. First HL is undetectable. Per nurse, no interruptions in gtt and no s/s bleeding noted. H/H has trended down slightly, plt remain wnl and stable.   Goal of Therapy:  Heparin level 0.3-0.7 units/ml Monitor platelets by anticoagulation protocol: Yes   Plan:  - Increase hep gtt to 1100 u/hr - HL in 6 hours - Daily HL/CBC - F/u LOT and plans for revascularization   Margie BilletErika K. von Vajna, PharmD Clinical Pharmacist - Resident Pager: [phone] Pharmacy:  [phone] 05/06/2014 12:50 PM

## 2014-05-06 NOTE — Progress Notes (Signed)
ANTICOAGULATION CONSULT NOTE - Follow Up Consult  Pharmacy Consult for Heparin Indication: chest pain/ACS  Allergies  Allergen Reactions  . Penicillins Anaphylaxis    Patient Measurements: Height: 5\' 9"  (175.3 cm) Weight: 132 lb 11.5 oz (60.2 kg) IBW/kg (Calculated) : 70.7  Heparin dosing weight: 60.2kg  Vital Signs: Temp: 98.2 F (36.8 C) (11/05 2016) Temp Source: Oral (11/05 2016) BP: 120/68 mmHg (11/05 2016) Pulse Rate: 82 (11/05 2016)  Labs:  Recent Labs  05/05/14 1232 05/06/14 0043 05/06/14 0334 05/06/14 1120 05/06/14 2005  HGB 13.5  --  12.6*  --   --   HCT 39.1  --  37.9*  --   --   PLT 309  --  312  --   --   HEPARINUNFRC  --   --   --  <0.10* 0.23*  CREATININE 0.76  --  0.75  --   --   TROPONINI  --  4.79* 5.71* 8.49*  --     Estimated Creatinine Clearance: 88.8 mL/min (by C-G formula based on Cr of 0.75).   Assessment: 53 yo M presents on 11/4 with acute onset of chest pain. Patient is on heparin gtt post-cath on 11/4 which showed extensive CAD. After rate increase this morning, level remains low at 0.23 units/mL. No issues with line and no stopping of infusion per RN.  Goal of Therapy:  Heparin level 0.3-0.7 units/ml Monitor platelets by anticoagulation protocol: Yes   Plan:  - Increase hep gtt to 1250 u/hr - HL in 6 hours - Daily HL/CBC - F/u LOT and plans for revascularization   Verlie Liotta D. Sarim Rothman, PharmD, BCPS Clinical Pharmacist Pager: [phone] 05/06/2014 8:59 PM

## 2014-05-06 NOTE — Plan of Care (Signed)
Problem: Consults Goal: MI Patient Education (See Patient Education module for education specifics.) Outcome: Progressing Goal: Skin Care Protocol Initiated - if Braden Score 18 or less If consults are not indicated, leave blank or document N/A Outcome: Completed/Met Date Met:  05/06/14 Goal: Tobacco Cessation referral if indicated Outcome: Progressing Goal: Nutrition Consult-if indicated Outcome: Not Applicable Date Met:  05/06/14 Goal: Diabetes Guidelines if Diabetic/Glucose > 140 If diabetic or lab glucose is > 140 mg/dl - Initiate Diabetes/Hyperglycemia Guidelines & Document Interventions  Outcome: Not Applicable Date Met:  05/06/14  Problem: Phase I Progression Outcomes Goal: Anginal pain relieved Outcome: Progressing Goal: Aspirin unless contraindicated Outcome: Completed/Met Date Met:  05/06/14 Goal: Voiding-avoid urinary catheter unless indicated Outcome: Completed/Met Date Met:  05/06/14 Goal: Hemodynamically stable Outcome: Completed/Met Date Met:  05/06/14 Goal: Vascular site scale level 0 - I Vascular Site Scale Level 0: No bruising/bleeding/hematoma Level I (Mild): Bruising/Ecchymosis, minimal bleeding/ooozing, palpable hematoma < 3 cm Level II (Moderate): Bleeding not affecting hemodynamic parameters, pseudoaneurysm, palpable hematoma > 3 cm Level III (Severe) Bleeding which affects hemodynamic parameters or retroperitoneal hemorrhage  Outcome: Completed/Met Date Met:  05/06/14 Goal: Initial discharge plan identified Outcome: Progressing Goal: Other Phase I Outcomes/Goals Outcome: Progressing     

## 2014-05-07 DIAGNOSIS — I2510 Atherosclerotic heart disease of native coronary artery without angina pectoris: Secondary | ICD-10-CM | POA: Diagnosis present

## 2014-05-07 LAB — HEPARIN LEVEL (UNFRACTIONATED): Heparin Unfractionated: 0.14 IU/mL — ABNORMAL LOW (ref 0.30–0.70)

## 2014-05-07 LAB — CBC
HCT: 35 % — ABNORMAL LOW (ref 39.0–52.0)
HEMOGLOBIN: 11.6 g/dL — AB (ref 13.0–17.0)
MCH: 29.2 pg (ref 26.0–34.0)
MCHC: 33.1 g/dL (ref 30.0–36.0)
MCV: 88.2 fL (ref 78.0–100.0)
PLATELETS: 307 10*3/uL (ref 150–400)
RBC: 3.97 MIL/uL — ABNORMAL LOW (ref 4.22–5.81)
RDW: 14.3 % (ref 11.5–15.5)
WBC: 10.1 10*3/uL (ref 4.0–10.5)

## 2014-05-07 MED ORDER — HEPARIN BOLUS VIA INFUSION
2000.0000 [IU] | Freq: Once | INTRAVENOUS | Status: AC
  Administered 2014-05-07: 2000 [IU] via INTRAVENOUS
  Filled 2014-05-07: qty 2000

## 2014-05-07 MED ORDER — MAGNESIUM HYDROXIDE 400 MG/5ML PO SUSP
30.0000 mL | Freq: Every day | ORAL | Status: DC | PRN
  Administered 2014-05-07: 30 mL via ORAL
  Filled 2014-05-07: qty 30

## 2014-05-07 MED ORDER — METOPROLOL TARTRATE 50 MG PO TABS
50.0000 mg | ORAL_TABLET | Freq: Two times a day (BID) | ORAL | Status: DC
  Administered 2014-05-07 – 2014-05-08 (×3): 50 mg via ORAL
  Filled 2014-05-07 (×4): qty 1

## 2014-05-07 NOTE — Progress Notes (Signed)
SUBJECTIVE:  Denies any current chest pain or SOB.  No acute complaints.  OBJECTIVE:   Vitals:   Filed Vitals:   05/07/14 0400 05/07/14 0715 05/07/14 0818 05/07/14 0927  BP: 137/82 126/83 94/55 118/72  Pulse: 74   77  Temp: 98.4 F (36.9 C) 97.6 F (36.4 C)    TempSrc: Oral Oral    Resp:  18    Height:      Weight: 131 lb 13.4 oz (59.8 kg)     SpO2: 98% 95%     I&O's:    Intake/Output Summary (Last 24 hours) at 05/07/14 1036 Last data filed at 05/07/14 0920  Gross per 24 hour  Intake 1127.5 ml  Output    200 ml  Net  927.5 ml   TELEMETRY: Reviewed telemetry pt in NSR:  PHYSICAL EXAM General: Well developed, well nourished, in no acute distress Head:   Normal cephalic and atramatic  Lungs:  Clear bilaterally to auscultation. Heart:  HRRR S1 S2  No JVD.   Abdomen: abdomen soft and non-tender Extremities:  No edema.   Neuro: Alert and oriented. Psych:  Normal affect, responds appropriately Skin: No rash   LABS: Basic Metabolic Panel:  Recent Labs  16/04/9610/04/15 1232 05/06/14 0334  NA 141 141  K 4.4 4.0  CL 104 107  CO2 24 20  GLUCOSE 110* 152*  BUN 20 17  CREATININE 0.76 0.75  CALCIUM 9.9 9.0   Liver Function Tests: No results for input(s): AST, ALT, ALKPHOS, BILITOT, PROT, ALBUMIN in the last 72 hours. No results for input(s): LIPASE, AMYLASE in the last 72 hours. CBC:  Recent Labs  05/06/14 0334 05/07/14 0215  WBC 8.3 10.1  HGB 12.6* 11.6*  HCT 37.9* 35.0*  MCV 88.3 88.2  PLT 312 307   Cardiac Enzymes:  Recent Labs  05/06/14 0043 05/06/14 0334 05/06/14 1120  TROPONINI 4.79* 5.71* 8.49*   BNP: Invalid input(s): POCBNP D-Dimer: No results for input(s): DDIMER in the last 72 hours. Hemoglobin A1C: No results for input(s): HGBA1C in the last 72 hours. Fasting Lipid Panel:  Recent Labs  05/06/14 0339  CHOL 149  HDL 55  LDLCALC 85  TRIG 47  CHOLHDL 2.7   Thyroid Function Tests: No results for input(s): TSH, T4TOTAL, T3FREE,  THYROIDAB in the last 72 hours.  Invalid input(s): FREET3 Anemia Panel: No results for input(s): VITAMINB12, FOLATE, FERRITIN, TIBC, IRON, RETICCTPCT in the last 72 hours. Coag Panel:   Lab Results  Component Value Date   INR 1.03 12/14/2010    RADIOLOGY: Dg Chest 2 View  05/05/2014   CLINICAL DATA:  Chest pain for a couple of days  EXAM: CHEST  2 VIEW  COMPARISON:  06/25/2011  FINDINGS: The cardiac silhouette and mediastinal contours are within normal limits.  The lungs are mildly hyperinflated.  There is no focal airspace consolidation, pleural effusion or pneumothorax.  There is no overt pulmonary edema.  There is no acute osseous abnormality.  Mild degenerative changes of the spine are present.  IMPRESSION: Mild hyperinflation without focal airspace consolidation.   Electronically Signed   By: Fannie KneeKenneth  Crosby   On: 05/05/2014 15:55      ASSESSMENT/PLAN:   NSTEMI: likely contributed to by medication noncompliance, patient underwent cardiac cath yesterday with Dr. Excell Seltzerooper which revealed total occlusion of the RCA, moderate mid LAD stenosis, and a patent LCx with mild nonobstructive disease.  Plan for maximizing medical therapy with consideration of possible intervention after maximize medical therapy. - Dual  Antiplatelet therapy: ASA and plavix - Atorvastatin 40mg  - Continue IMDUR 30mg  BID (consider changing to 60 daily as is 24 hour medication) - Increase Metoprolol to 50mg  BID - Will need PCP, he has trouble affording medications> placed case management consult for assistance, may do well with [facility]   NSVT: no further episodes  History of ETOH use: nursing staff concerned about patient's anxiety, suspect may have had last ETOH sooner than 1 month ago. - CIWA minimal, can likely d/c in AM - MVI, folic acid, thiamine  Tobacco Abuse: Cessation important given her CAD -Nicotine patch  HLD: - atorvastatin 40mg  daily  Gust RungHoffman, Erik C, DO  05/07/2014  10:36 AM    I have examined  the patient and reviewed assessment and plan and discussed with patient.  Agree with above as stated.  Cost of medicines is the big issue causing noncompliance.  COntinue medical therapy.  Hesitant to commit him to [facility] due to need for compliance with Plavix.  Try to manage angina medically rather than with CTO PCI.  Transfer to floor today.  Cardiology f/u with Dr. Anne FuSkains.  Shavon Ashmore S.

## 2014-05-07 NOTE — Progress Notes (Signed)
ANTICOAGULATION CONSULT NOTE - Follow Up Consult  Pharmacy Consult for heparin Indication: NSTEMI  Labs:  Recent Labs  05/05/14 1232 05/06/14 0043 05/06/14 0334 05/06/14 1120 05/06/14 2005 05/07/14 0215  HGB 13.5  --  12.6*  --   --  11.6*  HCT 39.1  --  37.9*  --   --  35.0*  PLT 309  --  312  --   --  307  HEPARINUNFRC  --   --   --  <0.10* 0.23* 0.14*  CREATININE 0.76  --  0.75  --   --   --   TROPONINI  --  4.79* 5.71* 8.49*  --   --     Assessment: 53yo male now w/ lower heparin level despite increased rate.  Per RN site was moved d/t pt discomfort but no signs of infiltration or other issue.  Goal of Therapy:  Heparin level 0.3-0.7 units/ml   Plan:  Will rebolus with heparin 2000 units and increase gtt by 4 units/kg/hr to 1500 units/hr and check level in   Vernard GamblesVeronda Murle Hellstrom, PharmD, BCPS  05/07/2014,4:00 AM

## 2014-05-07 NOTE — Care Management Note (Signed)
    Page 1 of 1   05/09/2014     8:10:28 AM CARE MANAGEMENT NOTE 05/09/2014  Patient:  Scott Berry, Scott Berry   Account Number:  [id number]  Date Initiated:  05/07/2014  Documentation initiated by:  Luz Lex  Subjective/Objective Assessment:   Admitted with CP     Action/Plan:   Anticipated DC Date:  05/09/2014   Anticipated DC Plan:  HOME/SELF CARE  In-house referral  Clinical Social Worker      DC Planning Services  CM consult  Medication Assistance      Choice offered to / List presented to:             Status of service:  Completed, signed off Medicare Important Message given?   (If response is "NO", the following Medicare IM given date fields will be blank) Date Medicare IM given:   Medicare IM given by:   Date Additional Medicare IM given:   Additional Medicare IM given by:    Discharge Disposition:  HOME/SELF CARE  Per UR Regulation:  Reviewed for med. necessity/level of care/duration of stay  If discussed at [place] of Stay Meetings, dates discussed:    Comments:  05/08/14 08:00 CM met with pt in room and gave him [facility] letter with list of participating pharmacies.  Pt verbalized understanding of MATCH parameters.  CM also gave pt [facility] pamphlet and pt verbalized understanding he is to go to the clinic any weekday mroning from 9-10am and ask for: AN APPOINTMENT FOR A PCP; AN APPOINTMENT WITH A NAVIGATOR TO SECURE INSURANCE DURING THIS OPEN ENROLLMENT PERIOD; AN APPOINTMENT FOR FOLLOW UP MEDICAL CARE.  No other CM needs were communicated.  Mariane Masters, BSN, [place].  05-07-14 4pm  Shanna Cisco [phone] Talked with patient - agreeable for followup with [facility] for PCP and medication followup.  Given brochure with location and numbers. States will go there directly on discharge for followup with meds and PCP appt. will be illigible for Match letter on discharge for intitial 30 days of medication.  Asking for place for shelter on  discharge - states homeless - SW consulted.

## 2014-05-08 DIAGNOSIS — E785 Hyperlipidemia, unspecified: Secondary | ICD-10-CM | POA: Diagnosis present

## 2014-05-08 LAB — COMPREHENSIVE METABOLIC PANEL
ALT: 13 U/L (ref 0–53)
ANION GAP: 10 (ref 5–15)
AST: 24 U/L (ref 0–37)
Albumin: 3.1 g/dL — ABNORMAL LOW (ref 3.5–5.2)
Alkaline Phosphatase: 50 U/L (ref 39–117)
BILIRUBIN TOTAL: 0.3 mg/dL (ref 0.3–1.2)
BUN: 17 mg/dL (ref 6–23)
CHLORIDE: 103 meq/L (ref 96–112)
CO2: 26 mEq/L (ref 19–32)
Calcium: 8.8 mg/dL (ref 8.4–10.5)
Creatinine, Ser: 0.83 mg/dL (ref 0.50–1.35)
GFR calc non Af Amer: >90 mL/min (ref >90)
GLUCOSE: 92 mg/dL (ref 70–99)
Potassium: 4.2 mEq/L (ref 3.7–5.3)
Sodium: 139 mEq/L (ref 137–147)
Total Protein: 6.2 g/dL (ref 6.0–8.3)

## 2014-05-08 LAB — CBC
HEMATOCRIT: 37.2 % — AB (ref 39.0–52.0)
Hemoglobin: 12.6 g/dL — ABNORMAL LOW (ref 13.0–17.0)
MCH: 29.9 pg (ref 26.0–34.0)
MCHC: 33.9 g/dL (ref 30.0–36.0)
MCV: 88.4 fL (ref 78.0–100.0)
PLATELETS: 296 10*3/uL (ref 150–400)
RBC: 4.21 MIL/uL — AB (ref 4.22–5.81)
RDW: 14.2 % (ref 11.5–15.5)
WBC: 7.7 10*3/uL (ref 4.0–10.5)

## 2014-05-08 LAB — TROPONIN I: Troponin I: 6.53 ng/mL (ref <0.30)

## 2014-05-08 MED ORDER — THIAMINE HCL 100 MG PO TABS: 100.0000 mg | ORAL_TABLET | Freq: Every day | ORAL | Status: DC

## 2014-05-08 MED ORDER — ADULT MULTIVITAMIN W/MINERALS CH: 1.0000 | ORAL_TABLET | Freq: Every day | ORAL | Status: DC

## 2014-05-08 MED ORDER — ASPIRIN 81 MG PO TBEC: 81.0000 mg | DELAYED_RELEASE_TABLET | Freq: Every day | ORAL | Status: DC

## 2014-05-08 MED ORDER — NITROGLYCERIN 0.4 MG SL SUBL
0.4000 mg | SUBLINGUAL_TABLET | SUBLINGUAL | Status: DC | PRN
Start: 2014-05-08 — End: 2014-05-12

## 2014-05-08 MED ORDER — METOPROLOL TARTRATE 50 MG PO TABS: 50.0000 mg | ORAL_TABLET | Freq: Two times a day (BID) | ORAL | Status: DC

## 2014-05-08 MED ORDER — ATORVASTATIN CALCIUM 40 MG PO TABS: 40.0000 mg | ORAL_TABLET | Freq: Every day | ORAL | Status: DC

## 2014-05-08 MED ORDER — ACETAMINOPHEN 325 MG PO TABS: 650.0000 mg | ORAL_TABLET | ORAL | Status: DC | PRN

## 2014-05-08 MED ORDER — ISOSORBIDE MONONITRATE ER 30 MG PO TB24: 30.0000 mg | ORAL_TABLET | Freq: Two times a day (BID) | ORAL | Status: DC

## 2014-05-08 MED ORDER — FOLIC ACID 1 MG PO TABS: 1.0000 mg | ORAL_TABLET | Freq: Every day | ORAL | Status: DC

## 2014-05-08 MED ORDER — CLOPIDOGREL BISULFATE 75 MG PO TABS: 75.0000 mg | ORAL_TABLET | Freq: Every day | ORAL | Status: DC

## 2014-05-08 NOTE — Plan of Care (Signed)
Problem: Phase I Progression Outcomes Goal: Anginal pain relieved Outcome: Completed/Met Date Met:  05/08/14     

## 2014-05-08 NOTE — Clinical Social Work Note (Signed)
Clinical Social Worker attempted to visit with patient regarding possible homelessness, however patient has been discharged and has already left the hospital.  Clinical Social Worker will sign off for now as social work intervention is no longer needed. Please consult us again if new need arises.  Macario GoldsJesse Lasheika Ortloff, [place] [phone]

## 2014-05-08 NOTE — Progress Notes (Signed)
Patient ID: Scott Berry, male   DOB: 22-May-1961, 53 y.o.   MRN: [id number]   SUBJECTIVE:  Denies any current chest pain or SOB.  No acute complaints.  OBJECTIVE:   Vitals:   Filed Vitals:   05/07/14 1210 05/07/14 1625 05/07/14 1946 05/08/14 0443  BP: 105/56 112/76 113/75 113/69  Pulse: 73 75 72 59  Temp:  97.7 F (36.5 C) 98.2 F (36.8 C) 97.6 F (36.4 C)  TempSrc:  Oral Oral Oral  Resp:  18 16 18   Height:      Weight:      SpO2: 95% 97% 97% 97%   I&O's:    Intake/Output Summary (Last 24 hours) at 05/08/14 0802 Last data filed at 05/07/14 0920  Gross per 24 hour  Intake     20 ml  Output      0 ml  Net     20 ml   TELEMETRY: Reviewed telemetry pt in NSR:  PHYSICAL EXAM General: Well developed, well nourished, in no acute distress Head:   Normal cephalic and atramatic  Lungs:  Clear bilaterally to auscultation. Heart:  HRRR S1 S2  No JVD.   Abdomen: abdomen soft and non-tender Extremities:  No edema.   Neuro: Alert and oriented. Psych:  Normal affect, responds appropriately Skin: No rash   LABS: Basic Metabolic Panel:  Recent Labs  86/57/8409/11/13 0334 05/08/14 0500  NA 141 139  K 4.0 4.2  CL 107 103  CO2 20 26  GLUCOSE 152* 92  BUN 17 17  CREATININE 0.75 0.83  CALCIUM 9.0 8.8   Liver Function Tests:  Recent Labs  05/08/14 0500  AST 24  ALT 13  ALKPHOS 50  BILITOT 0.3  PROT 6.2  ALBUMIN 3.1*   No results for input(s): LIPASE, AMYLASE in the last 72 hours. CBC:  Recent Labs  05/07/14 0215 05/08/14 0500  WBC 10.1 7.7  HGB 11.6* 12.6*  HCT 35.0* 37.2*  MCV 88.2 88.4  PLT 307 296   Cardiac Enzymes:  Recent Labs  05/06/14 0334 05/06/14 1120 05/08/14 0500  TROPONINI 5.71* 8.49* 6.53*   BNP: Invalid input(s): POCBNP D-Dimer: No results for input(s): DDIMER in the last 72 hours. Hemoglobin A1C: No results for input(s): HGBA1C in the last 72 hours. Fasting Lipid Panel:  Recent Labs  05/06/14 0339  CHOL 149  HDL 55  LDLCALC 85    TRIG 47  CHOLHDL 2.7   Thyroid Function Tests: No results for input(s): TSH, T4TOTAL, T3FREE, THYROIDAB in the last 72 hours.  Invalid input(s): FREET3 Anemia Panel: No results for input(s): VITAMINB12, FOLATE, FERRITIN, TIBC, IRON, RETICCTPCT in the last 72 hours. Coag Panel:   Lab Results  Component Value Date   INR 1.03 12/14/2010    RADIOLOGY: Dg Chest 2 View  05/05/2014   CLINICAL DATA:  Chest pain for a couple of days  EXAM: CHEST  2 VIEW  COMPARISON:  06/25/2011  FINDINGS: The cardiac silhouette and mediastinal contours are within normal limits.  The lungs are mildly hyperinflated.  There is no focal airspace consolidation, pleural effusion or pneumothorax.  There is no overt pulmonary edema.  There is no acute osseous abnormality.  Mild degenerative changes of the spine are present.  IMPRESSION: Mild hyperinflation without focal airspace consolidation.   Electronically Signed   By: Fannie KneeKenneth  Crosby   On: 05/05/2014 15:55      ASSESSMENT/PLAN:   NSTEMI: likely contributed to by medication noncompliance, patient underwent cardiac cath 11/5  with Dr.  Cooper which revealed total occlusion of the RCA, moderate mid LAD stenosis, and a patent LCx with mild nonobstructive disease.  Plan for maximizing medical therapy with consideration of possible intervention after maximize medical therapy. - Dual Antiplatelet therapy: ASA and plavix - Atorvastatin 40mg  - Continue IMDUR 30mg  BID (consider changing to 60 daily as is 24 hour medication) - Increase Metoprolol to 50mg  BID - Outpatient f/u Dr Anne FuSkains    NSVT: no further episodes  History of ETOH use: nursing staff concerned about patient's anxiety, suspect may have had last ETOH sooner than 1 month ago. - folic acid   Tobacco Abuse: Cessation important given her CAD -Nicotine patch  HLD: - atorvastatin 40mg  daily  D/c home today f/u Odis LusterSkains   Deondrick Searls, MD  05/08/2014  8:02 AM

## 2014-05-08 NOTE — Accreditation Note (Signed)
PT DISCHARGE HOME WITH FRIEND. DISCUSSED IN FULL DETAIL DISCHARGE INSTRUCTIONS, PT MEDICATIONS AND COSTOF MEDICATION WITH MATCH CARD. PT AND FRIEND VU. ENOCOURAGE PT TO CALL DR. OFFICE AND OR HOSPITAL WITH ANY NEEDS.

## 2014-05-08 NOTE — Discharge Summary (Signed)
Patient ID: Scott Berry,  MRN: [id number], DOB/AGE: [birth year] 55 y.o.  Admit date: 05/05/2014 Discharge date: 05/08/2014  Primary Care Provider: Donato SchultzSKAINS, MARK, MD Primary Cardiologist: Dr Anne FuSkains  Discharge Diagnoses Principal Problem:   NSTEMI, initial episode of care Active Problems:   CAD- cath 05/05/14- med Rx- possible PCI in the future   Ventricular tachycardia, non-sustained   Hypertension   Tobacco abuse   Dyslipidemia, goal LDL below 70    Procedures: Coronary angiogram 05/05/14   Hospital Course:  53 year old Caucasian male with past medical history of hypertension, hyperlipidemia, Tobacco abuse, remote history of drug use. Patient's last cardiac catheterization on 09/12/2009 showed EF 60%, diffusely calcified RCA with lesions up to 50%, 30% proximal LAD, 60% mid LAD, 30% ostial left circumflex stenosis. The last time he saw Dr. Anne FuSkains was on 04/26/2014 at which time he was complaining of episodic chest pain on and off.            He was at a job fair in the morning of 05/05/2014 when he experienced sudden onset of chest pain while walking. The chest pain persisted throughout the rest of the day. In the ED patient was noted to have episode of nonsustained VT. Initial troponin was negative, repeat point-of-care troponin was mildly elevated at 0.31. Subsequent Troponin peaked at 8.49. Echo revealed an EF of 50-55%. Cath was done 05/05/14 by Dr. Excell Seltzerooper which revealed total occlusion of the RCA, moderate mid LAD stenosis, and a patent LCx with mild nonobstructive disease. Plan is for maximizing medical therapy with consideration of possible intervention after maximum medical therapy. It was felt that medication noncompliance contributed to the pt's NSTEMI. The pt was seen by Dr Eden EmmsNishan 05/08/14 and felt to stable for discharge.   Discharge Vitals:  Blood pressure 113/69, pulse 59, temperature 97.6 F (36.4 C), temperature source Oral, resp. rate 18, height 5\' 9"  (1.753 m), weight  131 lb 13.4 oz (59.8 kg), SpO2 97 %.    Labs: Results for orders placed or performed during the hospital encounter of 05/05/14 (from the past 24 hour(s))  CBC     Status: Abnormal   Collection Time: 05/08/14  5:00 AM  Result Value Ref Range   WBC 7.7 4.0 - 10.5 K/uL   RBC 4.21 (L) 4.22 - 5.81 MIL/uL   Hemoglobin 12.6 (L) 13.0 - 17.0 g/dL   HCT 78.237.2 (L) 95.639.0 - 21.352.0 %   MCV 88.4 78.0 - 100.0 fL   MCH 29.9 26.0 - 34.0 pg   MCHC 33.9 30.0 - 36.0 g/dL   RDW 08.614.2 57.811.5 - 46.915.5 %   Platelets 296 150 - 400 K/uL  Troponin I     Status: Abnormal   Collection Time: 05/08/14  5:00 AM  Result Value Ref Range   Troponin I 6.53 (HH) <0.30 ng/mL  Comprehensive metabolic panel     Status: Abnormal   Collection Time: 05/08/14  5:00 AM  Result Value Ref Range   Sodium 139 137 - 147 mEq/L   Potassium 4.2 3.7 - 5.3 mEq/L   Chloride 103 96 - 112 mEq/L   CO2 26 19 - 32 mEq/L   Glucose, Bld 92 70 - 99 mg/dL   BUN 17 6 - 23 mg/dL   Creatinine, Ser 6.290.83 0.50 - 1.35 mg/dL   Calcium 8.8 8.4 - 52.810.5 mg/dL   Total Protein 6.2 6.0 - 8.3 g/dL   Albumin 3.1 (L) 3.5 - 5.2 g/dL   AST 24 0 - 37 U/L  ALT 13 0 - 53 U/L   Alkaline Phosphatase 50 39 - 117 U/L   Total Bilirubin 0.3 0.3 - 1.2 mg/dL   GFR calc non Af Amer >90 >90 mL/min   GFR calc Af Amer >90 >90 mL/min   Anion gap 10 5 - 15    Disposition:  Follow-up Information    Follow up with Donato SchultzSKAINS, MARK, MD.   Specialty:  Cardiology   Why:  office will call you   Contact information:   [address] [place] [address] [phone]     PT will be a TCM discharge with phone call in 48 hrs and OV in 7-14 days.   Discharge Medications:    Medication List    TAKE these medications        acetaminophen 325 MG tablet  Commonly known as:  TYLENOL  Take 2 tablets (650 mg total) by mouth every 4 (four) hours as needed for headache or mild pain.     aspirin 81 MG EC tablet  Take 1 tablet (81 mg total) by mouth daily.      atorvastatin 40 MG tablet  Commonly known as:  LIPITOR  Take 1 tablet (40 mg total) by mouth daily at 6 PM.     clopidogrel 75 MG tablet  Commonly known as:  PLAVIX  Take 1 tablet (75 mg total) by mouth daily with breakfast.     folic acid 1 MG tablet  Commonly known as:  FOLVITE  Take 1 tablet (1 mg total) by mouth daily.     isosorbide mononitrate 30 MG 24 hr tablet  Commonly known as:  IMDUR  Take 1 tablet (30 mg total) by mouth 2 (two) times daily.     metoprolol 50 MG tablet  Commonly known as:  LOPRESSOR  Take 1 tablet (50 mg total) by mouth 2 (two) times daily.     multivitamin with minerals Tabs tablet  Take 1 tablet by mouth daily.     nitroGLYCERIN 0.4 MG SL tablet  Commonly known as:  NITROSTAT  Place 1 tablet (0.4 mg total) under the tongue every 5 (five) minutes x 3 doses as needed for chest pain.     thiamine 100 MG tablet  Take 1 tablet (100 mg total) by mouth daily.         Duration of Discharge Encounter: Greater than 30 minutes including physician time.  Jolene ProvostSigned, Mehran Guderian PA-C 05/08/2014 9:01 AM

## 2014-05-08 NOTE — Discharge Instructions (Signed)
Myocardial Infarction  A myocardial infarction (MI) is also called a heart attack. It causes damage to the heart that cannot be fixed. An MI often happens when a blood clot or other blockage cuts blood flow to the heart. When this happens, certain areas of the heart begin to die. This is an emergency.  HOME CARE  · Take medicine as told by your doctor.  · Change certain behaviors as told by your doctor. This may include:  ¨ Quitting smoking.  ¨ Being active.  ¨ Keeping a healthy weight.  ¨ Eating a heart-healthy diet. Ask your doctor for help with this diet.  ¨ Keeping your diabetes under control.  ¨ Lessening stress.  ¨ Limiting how much alcohol you drink.  GET HELP RIGHT AWAY IF:  · You have crushing or pressure-like chest pain that spreads to the arms, back, neck, or jaw. Call your local emergency services (911 in [place] Do not drive yourself to the hospital.  · You have severe chest pain.  · You have shortness of breath during rest, sleep, or with activity.  · You have sudden sweating or clammy skin.  · You feel sick to your stomach (nauseous) and throw up (vomit).  · You suddenly get lightheaded or dizzy.  · You feel your heart beating fast or skipping beats.  MAKE SURE YOU:   · Understand these instructions.  · Will watch your condition.  · Will get help right away if you are not doing well or get worse.  Document Released: 12/18/2011 Document Reviewed: 08/21/2013  Exit[employer]® Patient Information ©2015 [employer]. This information is not intended to replace advice given to you by your health care provider. Make sure you discuss any questions you have with your health care provider.

## 2014-05-12 ENCOUNTER — Ambulatory Visit: Payer: Self-pay | Attending: Internal Medicine | Admitting: Internal Medicine

## 2014-05-12 ENCOUNTER — Encounter: Payer: Self-pay | Admitting: Gastroenterology

## 2014-05-12 VITALS — BP 105/70 | HR 79 | Temp 97.6°F | Resp 18 | Ht 69.0 in | Wt 132.0 lb

## 2014-05-12 DIAGNOSIS — Z932 Ileostomy status: Secondary | ICD-10-CM | POA: Insufficient documentation

## 2014-05-12 DIAGNOSIS — I2510 Atherosclerotic heart disease of native coronary artery without angina pectoris: Secondary | ICD-10-CM | POA: Insufficient documentation

## 2014-05-12 DIAGNOSIS — Z23 Encounter for immunization: Secondary | ICD-10-CM | POA: Insufficient documentation

## 2014-05-12 DIAGNOSIS — Z9114 Patient's other noncompliance with medication regimen: Secondary | ICD-10-CM | POA: Insufficient documentation

## 2014-05-12 DIAGNOSIS — Z129 Encounter for screening for malignant neoplasm, site unspecified: Secondary | ICD-10-CM

## 2014-05-12 DIAGNOSIS — I10 Essential (primary) hypertension: Secondary | ICD-10-CM | POA: Insufficient documentation

## 2014-05-12 DIAGNOSIS — F17210 Nicotine dependence, cigarettes, uncomplicated: Secondary | ICD-10-CM | POA: Insufficient documentation

## 2014-05-12 DIAGNOSIS — Z9049 Acquired absence of other specified parts of digestive tract: Secondary | ICD-10-CM | POA: Insufficient documentation

## 2014-05-12 DIAGNOSIS — E785 Hyperlipidemia, unspecified: Secondary | ICD-10-CM | POA: Insufficient documentation

## 2014-05-12 DIAGNOSIS — Z720 Tobacco use: Secondary | ICD-10-CM

## 2014-05-12 DIAGNOSIS — I252 Old myocardial infarction: Secondary | ICD-10-CM | POA: Insufficient documentation

## 2014-05-12 MED ORDER — ATORVASTATIN CALCIUM 40 MG PO TABS: 40.0000 mg | ORAL_TABLET | Freq: Every day | ORAL | Status: DC

## 2014-05-12 MED ORDER — FOLIC ACID 1 MG PO TABS: 1.0000 mg | ORAL_TABLET | Freq: Every day | ORAL | Status: DC

## 2014-05-12 MED ORDER — CLOPIDOGREL BISULFATE 75 MG PO TABS: 75.0000 mg | ORAL_TABLET | Freq: Every day | ORAL | Status: DC

## 2014-05-12 MED ORDER — ISOSORBIDE MONONITRATE ER 30 MG PO TB24: 30.0000 mg | ORAL_TABLET | Freq: Two times a day (BID) | ORAL | Status: DC

## 2014-05-12 MED ORDER — ASPIRIN 81 MG PO TBEC: 81.0000 mg | DELAYED_RELEASE_TABLET | Freq: Every day | ORAL | Status: DC

## 2014-05-12 MED ORDER — NITROGLYCERIN 0.4 MG SL SUBL: 0.4000 mg | SUBLINGUAL_TABLET | SUBLINGUAL | Status: DC | PRN

## 2014-05-12 MED ORDER — METOPROLOL TARTRATE 50 MG PO TABS: 50.0000 mg | ORAL_TABLET | Freq: Two times a day (BID) | ORAL | Status: DC

## 2014-05-12 MED ORDER — THIAMINE HCL 100 MG PO TABS: 100.0000 mg | ORAL_TABLET | Freq: Every day | ORAL | Status: DC

## 2014-05-12 MED ORDER — ACETAMINOPHEN 325 MG PO TABS: 650.0000 mg | ORAL_TABLET | ORAL | Status: DC | PRN

## 2014-05-12 MED ORDER — ADULT MULTIVITAMIN W/MINERALS CH: 1.0000 | ORAL_TABLET | Freq: Every day | ORAL | Status: DC

## 2014-05-12 NOTE — Progress Notes (Signed)
LCSW met with patient in order to provide support and resources.  Patient states that he will need housing support soon, because his current housing situation will be changing soon. Patient also identified that his biggest concern is healthcare.  Patient states that he has no insurance and no opportunity to meet needs for healthcare and insurance. LCSW provided patient with [facility] application and encouraged patient to make an appointment with the financial counselor in order to follow up. Also, LCSW provided patient with a list of general [facility] resources.  Patient stated that he was concerned about resuming work after a heart attack.  LCSW provided patient with information for vocational rehabilitation in order to provide patient with a resource to gain job skills if he cannot resume his previous work once recovered.  LCSW also provided card for follow up contact as needed.  Christene Lye MSW, LCSW

## 2014-05-12 NOTE — Progress Notes (Signed)
Pt presents to [facility] for regular follow, states he needs Publishing copyfinancial assistant and housing assistant.

## 2014-05-12 NOTE — Progress Notes (Signed)
Patient ID: Scott Berry, male   DOB: [birth year], 53 y.o.   MRN: [id number]  Patient Demographics  Scott Berry, is a 53 y.o. male  [id number]  [id number]  DOB - [birth year]  Chief Complaint  Patient presents with  . Follow-up        Subjective:  [name] today is PRESENTING WITH Follow-up  on 05/12/2014  HPI: Patient is 53 y.o. male with past medical history of CAD on medical treatment, hypertension, hyperlipidemia, Tobacco abuse, remote history of drug use presenting to the clinic today for evaluation of the above noted complaint.He was recently discharged from the hospital, he was admitted for chest pain, he underwent Cardiac Catheterization and was found to have total occlusion of the RCA, moderate mid LAD stenosis, and a patent LCx with mild nonobstructive disease. Plan is for maximizing medical therapy with consideration of possible intervention after maximum medical therapy. It was felt that medication noncompliance contributed to the pt's NSTEMI.  Unfortunately he continues to smoke. He however does not have any chest pain today  Patient has also has No headache, No chest pain, No abdominal pain,No Nausea, No new weakness tingling or numbness, No Cough or SOB.      Objective:     Filed Vitals:   05/12/14 0923  BP: 105/70  Pulse: 79  Temp: 97.6 F (36.4 C)  TempSrc: Oral  Resp: 18  Height: 5\' 9"  (1.753 m)  Weight: 132 lb (59.875 kg)  SpO2: 96%     ALLERGIES:   Allergies  Allergen Reactions  . Penicillins Anaphylaxis    PAST MEDICAL HISTORY: Past Medical History  Diagnosis Date  . Hypertension   . Sleep apnea 1995    not a problem now  . Shortness of breath     if don't take medicines  . 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 Polysubstance abuse in past 08/15/2011  . Personal history of colonic polyps 08/15/2011  . Tobacco abuse 08/15/2011  . Pure hypercholesterolemia   . Coronary artery  arteriosclerosis 2011    cath with long tubular heavily calcified mid LAD 60%, calcified RCA with multiple 50% stenosis, ostial left circ stenosis of 30% and calcified LM with no stenosis on medical management    PAST SURGICAL HISTORY: Past Surgical History  Procedure Laterality Date  . Foreign body removal rectal  Oct 2011  . Colon surgery  14Jun2012    colectomy/diverting ileostomy - colon perf  . Ileo loop diversion  06/21/2011    Procedure: Takedown of loop ileostomy  . Loop ileostomy takedown  20Dec2012    Procedure: Takedown of loop ileostomy  . Cardiac catheterization  09/10/2009    Results in chart    FAMILY HISTORY: Family History  Problem Relation Age of Onset  . Stroke Father   . Stroke Mother     MEDICATIONS: No current outpatient prescriptions on file prior to visit.   No current facility-administered medications on file prior to visit.    SOCIAL HISTORY:   reports that he has been smoking Cigarettes.  He has been smoking about 0.50 packs per day. He has never used smokeless tobacco. He reports that he does not drink alcohol or use illicit drugs.  REVIEW OF SYSTEMS:  Constitutional:   No   Fevers, chills, fatigue.  HEENT:    No headaches, Sore throat,   Cardio-vascular: No chest pain,  Orthopnea, swelling in lower extremities, anasarca, palpitations  GI:  No abdominal pain, nausea, vomiting, diarrhea  Resp: No shortness of breath,  No coughing up of blood.No cough.No wheezing.  Skin:  no rash or lesions.  GU:  no dysuria, change in color of urine, no urgency or frequency.  No flank pain.  Musculoskeletal: No joint pain or swelling.  No decreased range of motion.  No back pain.  Psych: No change in mood or affect. No depression or anxiety.  No memory loss.   Exam  General appearance :Awake, alert, not in any distress. Speech Clear. Not toxic Looking HEENT: Atraumatic and Normocephalic, pupils equally reactive to light and  accomodation Neck: supple, no JVD. No cervical lymphadenopathy.  Chest:Good air entry bilaterally, no added sounds  CVS: S1 S2 regular, no murmurs.  Abdomen: Bowel sounds present, Non tender and not distended with no gaurding, rigidity or rebound. Extremities: B/L Lower Ext shows no edema, both legs are warm to touch Neurology: Awake alert, and oriented X 3, CN II-XII intact, Non focal Skin:No Rash Wounds:N/A    Data Review   CBC  Recent Labs Lab 05/05/14 1232 05/06/14 0334 05/07/14 0215 05/08/14 0500  WBC 7.1 8.3 10.1 7.7  HGB 13.5 12.6* 11.6* 12.6*  HCT 39.1 37.9* 35.0* 37.2*  PLT 309 312 307 296  MCV 88.1 88.3 88.2 88.4  MCH 30.4 29.4 29.2 29.9  MCHC 34.5 33.2 33.1 33.9  RDW 14.1 14.3 14.3 14.2    Chemistries    Recent Labs Lab 05/05/14 1232 05/06/14 0334 05/08/14 0500  NA 141 141 139  K 4.4 4.0 4.2  CL 104 107 103  CO2 24 20 26   GLUCOSE 110* 152* 92  BUN 20 17 17   CREATININE 0.76 0.75 0.83  CALCIUM 9.9 9.0 8.8  AST  --   --  24  ALT  --   --  13  ALKPHOS  --   --  50  BILITOT  --   --  0.3   ------------------------------------------------------------------------------------------------------------------ No results for input(s): HGBA1C in the last 72 hours. ------------------------------------------------------------------------------------------------------------------ No results for input(s): CHOL, HDL, LDLCALC, TRIG, CHOLHDL, LDLDIRECT in the last 72 hours. ------------------------------------------------------------------------------------------------------------------ No results for input(s): TSH, T4TOTAL, T3FREE, THYROIDAB in the last 72 hours.  Invalid input(s): FREET3 ------------------------------------------------------------------------------------------------------------------ No results for input(s): VITAMINB12, FOLATE, FERRITIN, TIBC, IRON, RETICCTPCT in the last 72 hours.  Coagulation profile  No results for input(s): INR, PROTIME  in the last 168 hours.     Assessment & Plan  CAD:on medical management. Continue ASA/PLavix/Lipitor and Metoprolol. Currently chest pain free. Have counseled again regarding importance of smoking  ZOX:[id number]:controlled, continue Nitrates, Metopolol.  Dyslipidemia:continue statins, repeat LDL in 3 months. Last LDL on 11/5 85-goal is less than 70  Tobacco Abuse:counseled, does not seem motivated to quit.  Health Maintenance -Colonoscopy:will refer to Gi -Vaccinations:Flu vaccine today  Follow up in 2 months.  The patient was given clear instructions to go to ER or return to medical center if symptoms don't improve, worsen or new problems develop. The patient verbalized understanding. The patient was told to call to get lab results if they haven't heard anything in the next week.

## 2014-05-13 ENCOUNTER — Ambulatory Visit: Payer: Self-pay | Admitting: Physician Assistant

## 2014-05-24 ENCOUNTER — Telehealth: Payer: Self-pay | Admitting: Cardiology

## 2014-05-24 ENCOUNTER — Encounter: Payer: Self-pay | Admitting: Physician Assistant

## 2014-05-24 NOTE — Telephone Encounter (Signed)
Patient has an appointment next Monday November 30 @ 12:00pm; patient thought the appointment was for today

## 2014-05-31 ENCOUNTER — Ambulatory Visit: Payer: Self-pay

## 2014-06-02 ENCOUNTER — Ambulatory Visit (INDEPENDENT_AMBULATORY_CARE_PROVIDER_SITE_OTHER): Payer: Self-pay | Admitting: Cardiology

## 2014-06-02 ENCOUNTER — Encounter: Payer: Self-pay | Admitting: Cardiology

## 2014-06-02 VITALS — BP 108/82 | HR 67 | Ht 69.0 in | Wt 139.0 lb

## 2014-06-02 DIAGNOSIS — I214 Non-ST elevation (NSTEMI) myocardial infarction: Secondary | ICD-10-CM

## 2014-06-02 DIAGNOSIS — Z720 Tobacco use: Secondary | ICD-10-CM | POA: Insufficient documentation

## 2014-06-02 DIAGNOSIS — I2583 Coronary atherosclerosis due to lipid rich plaque: Principal | ICD-10-CM

## 2014-06-02 DIAGNOSIS — I208 Other forms of angina pectoris: Secondary | ICD-10-CM

## 2014-06-02 DIAGNOSIS — I2510 Atherosclerotic heart disease of native coronary artery without angina pectoris: Secondary | ICD-10-CM | POA: Insufficient documentation

## 2014-06-02 MED ORDER — NITROGLYCERIN 0.4 MG SL SUBL: 0.4000 mg | SUBLINGUAL_TABLET | SUBLINGUAL | Status: DC | PRN

## 2014-06-02 NOTE — Patient Instructions (Signed)
The current medical regimen is effective;  continue present plan and medications.  OK to return to warehouse work.  Follow up in 3 months with Dr Anne FuSkains.

## 2014-06-02 NOTE — Progress Notes (Signed)
[address] [place], [place]  [address] Phone: [phone] Fax:  [phone]  Date:  06/02/2014   ID:  Scott Berry, DOB Oct 19, 1960, MRN [id number]  PCP:  Donato SchultzSKAINS, Aayat Hajjar, MD   History of Present Illness: Scott Monasimber K Chismar is a 53 y.o. male with history of hyperlipidemia, prior drug use, hypertension with hospitalization November 2015 after complaining of episodic chest pain off and on. In the ED emergency room he was noted to have nonsustained VT. Point-of-care troponin was mildly elevated at 0.31. Troponin subsequently peaked at 8.4. Echocardiogram showed ejection fraction of 55%. Cardiac catheterization was performed on 05/05/14 by Dr. Excell Seltzerooper which revealed a total occlusion of RCA, moderate mid LAD stenosis and patent left circumflex with mild nonobstructive disease. Plan currently is to maximize medical therapy with consideration of possible intervention after maximal medical therapy.   Prior trouble continuing with medications. Both pravastatin and lisinopril have been stopped over time. Denies any significant chest pain. Sometimes feels a bam, in the chest. Sudden on and off. Lays down and hard to breath.  Imdur helps a lot he states.     Wt Readings from Last 3 Encounters:  06/02/14 139 lb (63.05 kg)  05/12/14 132 lb (59.875 kg)  05/07/14 131 lb 13.4 oz (59.8 kg)     Past Medical History  Diagnosis Date  . Hypertension   . Sleep apnea 1995    not a problem now  . Shortness of breath     if don't take medicines  . 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 Polysubstance abuse in past 08/15/2011  . Personal history of colonic polyps 08/15/2011  . Tobacco abuse 08/15/2011  . Pure hypercholesterolemia   . Coronary artery arteriosclerosis 2011    cath with long tubular heavily calcified mid LAD 60%, calcified RCA with multiple 50% stenosis, ostial left circ stenosis of 30% and calcified LM with no stenosis on medical  management    Past Surgical History  Procedure Laterality Date  . Foreign body removal rectal  Oct 2011  . Colon surgery  14Jun2012    colectomy/diverting ileostomy - colon perf  . Ileo loop diversion  06/21/2011    Procedure: Takedown of loop ileostomy  . Loop ileostomy takedown  20Dec2012    Procedure: Takedown of loop ileostomy  . Cardiac catheterization  09/10/2009    Results in chart    Current Outpatient Prescriptions  Medication Sig Dispense Refill  . aspirin 81 MG EC tablet Take 1 tablet (81 mg total) by mouth daily. 30 tablet 3  . atorvastatin (LIPITOR) 40 MG tablet Take 1 tablet (40 mg total) by mouth daily at 6 PM. 30 tablet 11  . clopidogrel (PLAVIX) 75 MG tablet Take 1 tablet (75 mg total) by mouth daily with breakfast. 30 tablet 11  . isosorbide mononitrate (IMDUR) 60 MG 24 hr tablet Take 30 mg by mouth 2 (two) times daily.   11  . metoprolol (LOPRESSOR) 50 MG tablet Take 1 tablet (50 mg total) by mouth 2 (two) times daily. 60 tablet 11  . nitroGLYCERIN (NITROSTAT) 0.4 MG SL tablet Place 1 tablet (0.4 mg total) under the tongue every 5 (five) minutes x 3 doses as needed for chest pain. 25 tablet 3  . acetaminophen (TYLENOL) 325 MG tablet Take 2 tablets (650 mg total) by mouth every 4 (four) hours as needed for headache or mild pain. (Patient not taking: Reported on 06/02/2014)    .  folic acid (FOLVITE) 1 MG tablet Take 1 tablet (1 mg total) by mouth daily. (Patient not taking: Reported on 06/02/2014) 30 tablet 11  . isosorbide mononitrate (IMDUR) 30 MG 24 hr tablet Take 1 tablet (30 mg total) by mouth 2 (two) times daily. (Patient not taking: Reported on 06/02/2014) 60 tablet 11  . Multiple Vitamin (MULTIVITAMIN WITH MINERALS) TABS tablet Take 1 tablet by mouth daily. (Patient not taking: Reported on 06/02/2014) 30 tablet 3  . thiamine 100 MG tablet Take 1 tablet (100 mg total) by mouth daily. (Patient not taking: Reported on 06/02/2014) 30 tablet 3   No current  facility-administered medications for this visit.    Allergies:    Allergies  Allergen Reactions  . Penicillins Anaphylaxis    Social History:  The patient  reports that he has been smoking Cigarettes.  He has been smoking about 0.50 packs per day. He has never used smokeless tobacco. He reports that he does not drink alcohol or use illicit drugs.   Family History  Problem Relation Age of Onset  . Stroke Father   . Stroke Mother     ROS:  Please see the history of present illness.   Denies any fevers, chills, orthopnea, PND   All other systems reviewed and negative.   PHYSICAL EXAM: VS:  BP 108/82 mmHg  Pulse 67  Ht 5\' 9"  (1.753 m)  Wt 139 lb (63.05 kg)  BMI 20.52 kg/m2 Well nourished, well developed, in no acute distress HEENT: normal, [place]/AT, EOMI Neck: no JVD, normal carotid upstroke, no bruit Cardiac:  normal S1, S2; RRR; no murmur Lungs:  clear to auscultation bilaterally, no wheezing, rhonchi or rales Abd: soft, nontender, no hepatomegaly, no bruits Ext: no edema, 2+ distal pulses Skin: warm and dry GU: deferred Neuro: no focal abnormalities noted, AAO x 3  EKG:   06/02/14-sinus rhythm, 67, left axis deviation. No ST segment changes. T-wave inversion noted in 3, aVF. Prior 04/26/14-heart rate 88 bpmSinus rhythm, PVC, vertical axis, right atrial enlargement    peaked T waves.        ASSESSMENT AND PLAN:  53 year old with occluded RCA, non-ST elevation myocardial infarction in beginning of November 2015, here for follow-up.  1. Coronary artery disease-feeling much better on medication. Will refill his nitroglycerin when necessary. Continue with isosorbide, metoprolol. Medical management for now. Since he is feeling well, no indication at this point for CTO procedure of RCA.  2. Angina-as above. Doing well.  3. Non-ST elevation myocardial infarction-improved symptomatically. No syncope. He is starting to work once again at [employer]. He is doing well without any anginal  symptoms. I'm fine with him proceeding with work. He knows to slow down if angina recurs. He may call me if this occurs. Continue with aspirin, Plavix. Beta blocker. Statin.  4. Tobacco use-encouraged cessation.  3 month follow-up.   Signed, Donato SchultzMark Kelis Plasse, MD [facility]  06/02/2014 9:20 AM

## 2014-06-02 NOTE — Addendum Note (Signed)
Addended by: Sharin GraveFLEMING, PAMELA J on: 06/02/2014 09:35 AM   Modules accepted: Orders, Medications

## 2014-06-10 ENCOUNTER — Encounter (HOSPITAL_COMMUNITY): Payer: Self-pay | Admitting: Cardiovascular Disease

## 2014-07-09 ENCOUNTER — Encounter: Payer: Self-pay | Admitting: Internal Medicine

## 2014-07-12 ENCOUNTER — Ambulatory Visit: Payer: Self-pay | Admitting: Gastroenterology

## 2014-09-02 ENCOUNTER — Ambulatory Visit: Payer: Self-pay | Admitting: Cardiology

## 2014-09-07 ENCOUNTER — Encounter: Payer: Self-pay | Admitting: Cardiology

## 2014-10-14 ENCOUNTER — Other Ambulatory Visit: Payer: Self-pay | Admitting: *Deleted

## 2014-10-14 MED ORDER — ISOSORBIDE MONONITRATE ER 60 MG PO TB24: 30.0000 mg | ORAL_TABLET | Freq: Two times a day (BID) | ORAL | Status: DC

## 2014-11-01 ENCOUNTER — Encounter: Payer: Self-pay | Admitting: Cardiology

## 2014-11-01 ENCOUNTER — Ambulatory Visit (INDEPENDENT_AMBULATORY_CARE_PROVIDER_SITE_OTHER): Payer: Self-pay | Admitting: Cardiology

## 2014-11-01 VITALS — BP 124/82 | HR 76 | Ht 69.0 in | Wt 136.0 lb

## 2014-11-01 DIAGNOSIS — I208 Other forms of angina pectoris: Secondary | ICD-10-CM

## 2014-11-01 DIAGNOSIS — Z720 Tobacco use: Secondary | ICD-10-CM

## 2014-11-01 DIAGNOSIS — I10 Essential (primary) hypertension: Secondary | ICD-10-CM

## 2014-11-01 DIAGNOSIS — I2583 Coronary atherosclerosis due to lipid rich plaque: Secondary | ICD-10-CM

## 2014-11-01 DIAGNOSIS — I2510 Atherosclerotic heart disease of native coronary artery without angina pectoris: Secondary | ICD-10-CM

## 2014-11-01 MED ORDER — NITROGLYCERIN 0.4 MG SL SUBL: 0.4000 mg | SUBLINGUAL_TABLET | SUBLINGUAL | Status: DC | PRN

## 2014-11-01 MED ORDER — ATORVASTATIN CALCIUM 40 MG PO TABS: 40.0000 mg | ORAL_TABLET | Freq: Every day | ORAL | Status: DC

## 2014-11-01 MED ORDER — METOPROLOL TARTRATE 25 MG PO TABS: 25.0000 mg | ORAL_TABLET | Freq: Two times a day (BID) | ORAL | Status: DC

## 2014-11-01 MED ORDER — ASPIRIN EC 81 MG PO TBEC: 81.0000 mg | DELAYED_RELEASE_TABLET | Freq: Every day | ORAL | Status: DC

## 2014-11-01 NOTE — Progress Notes (Signed)
[address] [place], [place]  [address] Phone: [phone] Fax:  [phone]  Date:  11/01/2014   ID:  Scott Monasimber K Schepp, DOB 09-17-1960, MRN [id number]  PCP:  Donato SchultzSKAINS, MARK, MD   History of Present Illness: Scott Berry is a 54 y.o. male with history of hyperlipidemia, prior drug use, hypertension with hospitalization November 2015 after complaining of episodic chest pain off and on. In the ED emergency room he was noted to have nonsustained VT. Point-of-care troponin was mildly elevated at 0.31. Troponin subsequently peaked at 8.4. Echocardiogram showed ejection fraction of 55%. Cardiac catheterization was performed on 05/05/14 by Dr. Excell Seltzerooper which revealed a total occlusion of RCA, moderate mid LAD stenosis and patent left circumflex with mild nonobstructive disease. Plan currently is to maximize medical therapy with consideration of possible intervention after maximal medical therapy.   Prior trouble continuing with medications. Both pravastatin and lisinopril have been stopped over time. Denies any significant chest pain. Sometimes feels a bam, in the chest. Sudden on and off. Lays down and hard to breath.  Imdur helps a lot he states.   Once again, he ran out of his medications and was unable to get refills. He had one isolated episode of angina he states.    Wt Readings from Last 3 Encounters:  11/01/14 136 lb (61.689 kg)  06/02/14 139 lb (63.05 kg)  05/12/14 132 lb (59.875 kg)     Past Medical History  Diagnosis Date  . Hypertension   . Sleep apnea 1995    not a problem now  . Shortness of breath     if don't take medicines  . 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 Polysubstance abuse in past 08/15/2011  . Personal history of colonic polyps 08/15/2011  . Tobacco abuse 08/15/2011  . Pure hypercholesterolemia   . Coronary artery arteriosclerosis 2011    cath with long tubular heavily calcified mid LAD 60%, calcified  RCA with multiple 50% stenosis, ostial left circ stenosis of 30% and calcified LM with no stenosis on medical management    Past Surgical History  Procedure Laterality Date  . Foreign body removal rectal  Oct 2011  . Colon surgery  14Jun2012    colectomy/diverting ileostomy - colon perf  . Ileo loop diversion  06/21/2011    Procedure: Takedown of loop ileostomy  . Loop ileostomy takedown  20Dec2012    Procedure: Takedown of loop ileostomy  . Cardiac catheterization  09/10/2009    Results in chart  . Left heart catheterization with coronary angiogram N/A 05/05/2014    Procedure: LEFT HEART CATHETERIZATION WITH CORONARY ANGIOGRAM;  Surgeon: Micheline ChapmanMichael D Cooper, MD;  [facility];  Service: Cardiovascular;  Laterality: N/A;  . Percutaneous coronary stent intervention (pci-s) Right 05/05/2014    Procedure: PERCUTANEOUS CORONARY STENT INTERVENTION (PCI-S);  Surgeon: Micheline ChapmanMichael D Cooper, MD;  [facility];  Service: Cardiovascular;  Laterality: Right;  Prox RCA    Current Outpatient Prescriptions  Medication Sig Dispense Refill  . isosorbide mononitrate (IMDUR) 60 MG 24 hr tablet Take 0.5 tablets (30 mg total) by mouth 2 (two) times daily. 30 tablet 0   No current facility-administered medications for this visit.    Allergies:    Allergies  Allergen Reactions  . Penicillins Anaphylaxis    Social History:  The patient  reports that he has been smoking Cigarettes.  He has been smoking about 0.50 packs per  day. He has never used smokeless tobacco. He reports that he does not drink alcohol or use illicit drugs.   Family History  Problem Relation Age of Onset  . Stroke Father   . Stroke Mother     ROS:  Please see the history of present illness.   Denies any fevers, chills, orthopnea, PND   All other systems reviewed and negative.   PHYSICAL EXAM: VS:  BP 124/82 mmHg  Pulse 76  Ht  (1.753 m)  Wt 136 lb (61.689 kg)  BMI 20.07 kg/m2 Well nourished, well developed, in  no acute distress HEENT: normal, [place]/AT, EOMI Neck: no JVD, normal carotid upstroke, no bruit Cardiac:  normal S1, S2; RRR; no murmur Lungs:  clear to auscultation bilaterally, no wheezing, rhonchi or rales Abd: soft, nontender, no hepatomegaly, no bruits Ext: no edema, 2+ distal pulses Skin: warm and dry GU: deferred Neuro: no focal abnormalities noted, AAO x 3  EKG:   06/02/14-sinus rhythm, 67, left axis deviation. No ST segment changes. T-wave inversion noted in 3, aVF. Prior 04/26/14-heart rate 88 bpmSinus rhythm, PVC, vertical axis, right atrial enlargement    peaked T waves.        ASSESSMENT AND PLAN:  54 year old with occluded RCA, non-ST elevation myocardial infarction in beginning of November 2015, here for follow-up.  1. Coronary artery disease-he has not been able to obtain his medications recently. He is here for refills today. We will restart his aspirin 81 mg, metoprolol at lower dose of 25 mg twice a day and atorvastatin 40 mg once a day. Explained the benefits of these medications including future heart attack reduction risk. He feels well normally with his isosorbide. This is the one medicine he had today.  Will refill his nitroglycerin when necessary.  Medical management for now. Since he is feeling well, no indication at this point for CTO procedure of RCA.  2. Angina-as above. Doing well. One isolated episode while walking the other day where he had to sit down. He has not had any further episodes.  3. Non-ST elevation myocardial infarction-improved symptomatically. No syncope. He is no longer working at [employer]. I'm fine with him proceeding with work. He knows to slow down if angina recurs. He may call me if this occurs. Continue with aspirin,Beta blocker. Statin.  4. Tobacco use-encouraged cessation.  6 month follow-up.   Signed, Donato Schultz, MD [facility]  11/01/2014 8:13 AM

## 2014-11-01 NOTE — Patient Instructions (Addendum)
Medication Instructions:  Your physician has recommended you make the following change in your medication:   1-START Aspirin 81 mg by mouth daily 2-START Atorvastatin 40 mg by mouth daily 3-Decrease Metoprolol tartrate 25 mg by mouth daily  Labwork: NONE  Testing/Procedures: NONE  Follow-Up: Your physician wants you to follow-up in: 6 month with Dr. Anne FuSkains. You will receive a reminder letter in the mail two months in advance. If you don't receive a letter, please call our office to schedule the follow-up appointment.  Any Other Special Instructions Will Be Listed Below (If Applicable).

## 2015-03-09 ENCOUNTER — Encounter (HOSPITAL_COMMUNITY): Payer: Self-pay | Admitting: Emergency Medicine

## 2015-03-09 ENCOUNTER — Emergency Department (HOSPITAL_COMMUNITY): Payer: Self-pay

## 2015-03-09 ENCOUNTER — Emergency Department (HOSPITAL_COMMUNITY)
Admission: EM | Admit: 2015-03-09 | Discharge: 2015-03-09 | Disposition: A | Discharge status: Home / Self Care | Payer: Self-pay | Attending: Emergency Medicine | Admitting: Emergency Medicine

## 2015-03-09 DIAGNOSIS — IMO0002 Reserved for concepts with insufficient information to code with codable children: Secondary | ICD-10-CM

## 2015-03-09 DIAGNOSIS — W19XXXA Unspecified fall, initial encounter: Secondary | ICD-10-CM

## 2015-03-09 DIAGNOSIS — Z79899 Other long term (current) drug therapy: Secondary | ICD-10-CM | POA: Insufficient documentation

## 2015-03-09 DIAGNOSIS — Z720 Tobacco use: Secondary | ICD-10-CM | POA: Insufficient documentation

## 2015-03-09 DIAGNOSIS — F10120 Alcohol abuse with intoxication, uncomplicated: Secondary | ICD-10-CM | POA: Insufficient documentation

## 2015-03-09 DIAGNOSIS — Y998 Other external cause status: Secondary | ICD-10-CM | POA: Insufficient documentation

## 2015-03-09 DIAGNOSIS — Y9289 Other specified places as the place of occurrence of the external cause: Secondary | ICD-10-CM | POA: Insufficient documentation

## 2015-03-09 DIAGNOSIS — Y9389 Activity, other specified: Secondary | ICD-10-CM | POA: Insufficient documentation

## 2015-03-09 DIAGNOSIS — W01198A Fall on same level from slipping, tripping and stumbling with subsequent striking against other object, initial encounter: Secondary | ICD-10-CM | POA: Insufficient documentation

## 2015-03-09 DIAGNOSIS — I2510 Atherosclerotic heart disease of native coronary artery without angina pectoris: Secondary | ICD-10-CM | POA: Insufficient documentation

## 2015-03-09 DIAGNOSIS — E875 Hyperkalemia: Secondary | ICD-10-CM | POA: Insufficient documentation

## 2015-03-09 DIAGNOSIS — S0101XA Laceration without foreign body of scalp, initial encounter: Secondary | ICD-10-CM | POA: Insufficient documentation

## 2015-03-09 DIAGNOSIS — Z23 Encounter for immunization: Secondary | ICD-10-CM | POA: Insufficient documentation

## 2015-03-09 DIAGNOSIS — I10 Essential (primary) hypertension: Secondary | ICD-10-CM | POA: Insufficient documentation

## 2015-03-09 DIAGNOSIS — Z86010 Personal history of colonic polyps: Secondary | ICD-10-CM | POA: Insufficient documentation

## 2015-03-09 LAB — CBC WITH DIFFERENTIAL/PLATELET
BASOS PCT: 1 % (ref 0–1)
Basophils Absolute: 0.1 10*3/uL (ref 0.0–0.1)
Eosinophils Absolute: 0.1 10*3/uL (ref 0.0–0.7)
Eosinophils Relative: 2 % (ref 0–5)
HEMATOCRIT: 43.8 % (ref 39.0–52.0)
HEMOGLOBIN: 15.4 g/dL (ref 13.0–17.0)
Lymphocytes Relative: 31 % (ref 12–46)
Lymphs Abs: 1.5 10*3/uL (ref 0.7–4.0)
MCH: 30.4 pg (ref 26.0–34.0)
MCHC: 35.2 g/dL (ref 30.0–36.0)
MCV: 86.6 fL (ref 78.0–100.0)
MONOS PCT: 22 % — AB (ref 3–12)
Monocytes Absolute: 1.1 10*3/uL — ABNORMAL HIGH (ref 0.1–1.0)
NEUTROS ABS: 2.2 10*3/uL (ref 1.7–7.7)
NEUTROS PCT: 44 % (ref 43–77)
Platelets: 186 10*3/uL (ref 150–400)
RBC: 5.06 MIL/uL (ref 4.22–5.81)
RDW: 14.8 % (ref 11.5–15.5)
WBC: 5 10*3/uL (ref 4.0–10.5)

## 2015-03-09 LAB — COMPREHENSIVE METABOLIC PANEL
ALK PHOS: 49 U/L (ref 38–126)
ALT: 32 U/L (ref 17–63)
ANION GAP: 12 (ref 5–15)
AST: 68 U/L — ABNORMAL HIGH (ref 15–41)
Albumin: 4 g/dL (ref 3.5–5.0)
BUN: 18 mg/dL (ref 6–20)
CALCIUM: 9 mg/dL (ref 8.9–10.3)
CO2: 25 mmol/L (ref 22–32)
Chloride: 96 mmol/L — ABNORMAL LOW (ref 101–111)
Creatinine, Ser: 1.07 mg/dL (ref 0.61–1.24)
GFR calc Af Amer: >60 mL/min (ref >60)
Glucose, Bld: 89 mg/dL (ref 65–99)
POTASSIUM: 5.4 mmol/L — AB (ref 3.5–5.1)
Sodium: 133 mmol/L — ABNORMAL LOW (ref 135–145)
TOTAL PROTEIN: 7.1 g/dL (ref 6.5–8.1)
Total Bilirubin: 1.4 mg/dL — ABNORMAL HIGH (ref 0.3–1.2)

## 2015-03-09 MED ORDER — SODIUM CHLORIDE 0.9 % IV BOLUS (SEPSIS)
1000.0000 mL | Freq: Once | INTRAVENOUS | Status: AC
  Administered 2015-03-09: 1000 mL via INTRAVENOUS

## 2015-03-09 MED ORDER — TETANUS-DIPHTH-ACELL PERTUSSIS 5-2.5-18.5 LF-MCG/0.5 IM SUSP
0.5000 mL | Freq: Once | INTRAMUSCULAR | Status: AC
  Administered 2015-03-09: 0.5 mL via INTRAMUSCULAR
  Filled 2015-03-09: qty 0.5

## 2015-03-09 MED ORDER — LIDOCAINE-EPINEPHRINE (PF) 2 %-1:200000 IJ SOLN
10.0000 mL | Freq: Once | INTRAMUSCULAR | Status: AC
  Administered 2015-03-09: 10 mL
  Filled 2015-03-09: qty 20
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
## 2015-03-09 NOTE — ED Notes (Signed)
Pt transported to CT ?
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
## 2015-03-10 ENCOUNTER — Telehealth: Payer: Self-pay | Admitting: *Deleted

## 2015-03-10 DIAGNOSIS — Z0000 Encounter for general adult medical examination without abnormal findings: Secondary | ICD-10-CM

## 2015-03-10 DIAGNOSIS — I10 Essential (primary) hypertension: Secondary | ICD-10-CM

## 2015-03-10 NOTE — Telephone Encounter (Signed)
They had me listed as PCP     Please help him set up PCP. (May be difficult, no insurance)    Had ER visit recently.     THanks        Donato Schultz, MD        Pt needs a PCP

## 2015-03-15 ENCOUNTER — Emergency Department (INDEPENDENT_AMBULATORY_CARE_PROVIDER_SITE_OTHER)
Admission: EM | Admit: 2015-03-15 | Discharge: 2015-03-15 | Disposition: A | Discharge status: Home / Self Care | Payer: Self-pay | Source: Home / Self Care | Attending: Emergency Medicine | Admitting: Emergency Medicine

## 2015-03-15 ENCOUNTER — Encounter (HOSPITAL_COMMUNITY): Payer: Self-pay | Admitting: Emergency Medicine

## 2015-03-15 DIAGNOSIS — Z4802 Encounter for removal of sutures: Secondary | ICD-10-CM

## 2015-03-15 NOTE — ED Provider Notes (Signed)
CSN: [id number]     Arrival date & time 03/15/15  1300 History   First MD Initiated Contact with Patient 03/15/15 1316     Chief Complaint  Patient presents with  . Suture / Staple Removal   (Consider location/radiation/quality/duration/timing/severity/associated sxs/prior Treatment) HPI Comments: For staple removal. Rec'd head laceration 6d ago after a fall and seen in the ED for lac repair. No complaints    Past Medical History  Diagnosis Date  . Hypertension   . Sleep apnea 1995    not a problem now  . Shortness of breath     if don't take medicines  . 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 Polysubstance abuse in past 08/15/2011  . Personal history of colonic polyps 08/15/2011  . Tobacco abuse 08/15/2011  . Pure hypercholesterolemia   . Coronary artery arteriosclerosis 2011    cath with long tubular heavily calcified mid LAD 60%, calcified RCA with multiple 50% stenosis, ostial left circ stenosis of 30% and calcified LM with no stenosis on medical management   Past Surgical History  Procedure Laterality Date  . Foreign body removal rectal  Oct 2011  . Colon surgery  14Jun2012    colectomy/diverting ileostomy - colon perf  . Ileo loop diversion  06/21/2011    Procedure: Takedown of loop ileostomy  . Loop ileostomy takedown  20Dec2012    Procedure: Takedown of loop ileostomy  . Cardiac catheterization  09/10/2009    Results in chart  . Left heart catheterization with coronary angiogram N/A 05/05/2014    Procedure: LEFT HEART CATHETERIZATION WITH CORONARY ANGIOGRAM;  Surgeon: Micheline Chapman, MD;  Location: [facility] CATH LAB;  Service: Cardiovascular;  Laterality: N/A;  . Percutaneous coronary stent intervention (pci-s) Right 05/05/2014    Procedure: PERCUTANEOUS CORONARY STENT INTERVENTION (PCI-S);  Surgeon: Micheline Chapman, MD;  Location: [facility] CATH LAB;  Service: Cardiovascular;  Laterality: Right;  Prox RCA   Family History  Problem  Relation Age of Onset  . Stroke Father   . Stroke Mother    Social History  Substance Use Topics  . Smoking status: Current Every Day Smoker -- 0.50 packs/day    Types: Cigarettes    Last Attempt to Quit: 07/03/2007  . Smokeless tobacco: Never Used  . Alcohol Use: 1.8 oz/week    3 Cans of beer per week     Comment: Recovered alcoholic x 6 years    Review of Systems  Skin: Positive for wound.  All other systems reviewed and are negative.   Allergies  Penicillins  Home Medications   Prior to Admission medications   Medication Sig Start Date End Date Taking? Authorizing Provider  isosorbide mononitrate (IMDUR) 60 MG 24 hr tablet Take 0.5 tablets (30 mg total) by mouth 2 (two) times daily. 10/14/14   [name], MD  nitroGLYCERIN (NITROSTAT) 0.4 MG SL tablet Place 1 tablet (0.4 mg total) under the tongue every 5 (five) minutes as needed for chest pain. 11/01/14   [name], MD   Meds Ordered and Administered this Visit  Medications - No data to display  BP 141/89 mmHg  Pulse 92  Temp(Src) 98.1 F (36.7 C) (Oral)  Resp 18  SpO2 96% No data found.   Physical Exam  Constitutional: He is oriented to person, place, and time. He appears well-developed and well-nourished. No distress.  Pulmonary/Chest: Effort normal.  Neurological: He is alert and oriented to person, place, and time. He exhibits normal muscle  tone.  Skin: Skin is warm and dry.  Wound healing well. No signs of infection  Nursing note and vitals reviewed.   ED Course  SUTURE REMOVAL Date/Time: 03/15/2015 1:36 PM Performed by: Phineas Real, Beverley Sherrard Authorized by: Charm Rings Consent: Verbal consent obtained. Risks and benefits: risks, benefits and alternatives were discussed Consent given by: patient Patient understanding: patient states understanding of the procedure being performed Patient identity confirmed: verbally with patient Body area: head/neck Location details: scalp Wound Appearance:  clean Staples Removed: 10 Patient tolerance: Patient tolerated the procedure well with no immediate complications   (including critical care time)  Labs Review Labs Reviewed - No data to display  Imaging Review No results found.   Visual Acuity Review  Right Eye Distance:   Left Eye Distance:   Bilateral Distance:    Right Eye Near:   Left Eye Near:    Bilateral Near:         MDM   1. Encounter for staple removal    All 10 staples removed. Healing well with no signs of infection. Edges well approximated.    Hayden Rasmussen, NP 03/15/15 [phone]

## 2015-03-15 NOTE — ED Notes (Signed)
Here to have staples removed Voices no new concerns.... Alert... No acute distress.

## 2015-03-15 NOTE — Discharge Instructions (Signed)
Staple Care and Removal °Your caregiver has used staples today to repair your wound. Staples are used to help a wound heal faster by holding the edges of the wound together. The staples can be removed when the wound has healed well enough to stay together after the staples are removed. A dressing (wound covering), depending on the location of the wound, may have been applied. This may be changed once per day or as instructed. If the dressing sticks, it may be soaked off with soapy water or hydrogen peroxide. °Only take over-the-counter or prescription medicines for pain, discomfort, or fever as directed by your caregiver.  °If you did not receive a tetanus shot today because you did not recall when your last one was given, check with your caregiver when you have your staples removed to determine if one is needed. °Return to your caregiver's office in 1 week or as suggested to have your staples removed. °SEEK IMMEDIATE MEDICAL CARE IF:  °· You have redness, swelling, or increasing pain in the wound. °· You have pus coming from the wound. °· You have a fever. °· You notice a bad smell coming from the wound or dressing. °· Your wound edges break open after staples have been removed. °Document Released: 03/13/2001 Document Revised: 09/10/2011 Document Reviewed: 03/28/2005 °ExitCare® Patient Information ©2015 [employer]. This information is not intended to replace advice given to you by your health care provider. Make sure you discuss any questions you have with your health care provider. ° °Suture Removal, Care After °Refer to this sheet in the next few weeks. These instructions provide you with information on caring for yourself after your procedure. Your health care provider may also give you more specific instructions. Your treatment has been planned according to current medical practices, but problems sometimes occur. Call your health care provider if you have any problems or questions after your procedure. °WHAT  TO EXPECT AFTER THE PROCEDURE °After your stitches (sutures) are removed, it is typical to have the following: °· Some discomfort and swelling in the wound area. °· Slight redness in the area. °HOME CARE INSTRUCTIONS  °· If you have skin adhesive strips over the wound area, do not take the strips off. They will fall off on their own in a few days. If the strips remain in place after 14 days, you may remove them. °· Change any bandages (dressings) at least once a day or as directed by your health care provider. If the bandage sticks, soak it off with warm, soapy water. °· Apply cream or ointment only as directed by your health care provider. If using cream or ointment, wash the area with soap and water 2 times a day to remove all the cream or ointment. Rinse off the soap and pat the area dry with a clean towel. °· Keep the wound area dry and clean. If the bandage becomes wet or dirty, or if it develops a bad smell, change it as soon as possible. °· Continue to protect the wound from injury. °· Use sunscreen when out in the sun. New scars become sunburned easily. °SEEK MEDICAL CARE IF: °· You have increasing redness, swelling, or pain in the wound. °· You see pus coming from the wound. °· You have a fever. °· You notice a bad smell coming from the wound or dressing. °· Your wound breaks open (edges not staying together). °Document Released: 03/13/2001 Document Revised: 04/08/2013 Document Reviewed: 01/28/2013 °ExitCare® Patient Information ©2015 [employer]. This information is not intended   to replace advice given to you by your health care provider. Make sure you discuss any questions you have with your health care provider. ° °

## 2015-03-21 ENCOUNTER — Inpatient Hospital Stay: Payer: Self-pay | Admitting: Family Medicine

## 2015-03-31 ENCOUNTER — Inpatient Hospital Stay (HOSPITAL_COMMUNITY)
Admission: EM | Admit: 2015-03-31 | Discharge: 2015-04-03 | DRG: 193 | Disposition: A | Discharge status: Home / Self Care | Payer: Self-pay | Attending: Internal Medicine | Admitting: Internal Medicine

## 2015-03-31 ENCOUNTER — Encounter (HOSPITAL_COMMUNITY): Payer: Self-pay

## 2015-03-31 ENCOUNTER — Emergency Department (HOSPITAL_COMMUNITY): Payer: Self-pay

## 2015-03-31 DIAGNOSIS — I2510 Atherosclerotic heart disease of native coronary artery without angina pectoris: Secondary | ICD-10-CM | POA: Diagnosis present

## 2015-03-31 DIAGNOSIS — J9601 Acute respiratory failure with hypoxia: Secondary | ICD-10-CM | POA: Diagnosis present

## 2015-03-31 DIAGNOSIS — E785 Hyperlipidemia, unspecified: Secondary | ICD-10-CM | POA: Diagnosis present

## 2015-03-31 DIAGNOSIS — J189 Pneumonia, unspecified organism: Principal | ICD-10-CM | POA: Diagnosis present

## 2015-03-31 DIAGNOSIS — S0101XA Laceration without foreign body of scalp, initial encounter: Secondary | ICD-10-CM | POA: Diagnosis present

## 2015-03-31 DIAGNOSIS — F17210 Nicotine dependence, cigarettes, uncomplicated: Secondary | ICD-10-CM | POA: Diagnosis present

## 2015-03-31 DIAGNOSIS — I2584 Coronary atherosclerosis due to calcified coronary lesion: Secondary | ICD-10-CM | POA: Diagnosis present

## 2015-03-31 DIAGNOSIS — J449 Chronic obstructive pulmonary disease, unspecified: Secondary | ICD-10-CM | POA: Diagnosis present

## 2015-03-31 DIAGNOSIS — E7800 Pure hypercholesterolemia, unspecified: Secondary | ICD-10-CM | POA: Diagnosis present

## 2015-03-31 DIAGNOSIS — Z720 Tobacco use: Secondary | ICD-10-CM | POA: Diagnosis present

## 2015-03-31 DIAGNOSIS — G4730 Sleep apnea, unspecified: Secondary | ICD-10-CM | POA: Diagnosis present

## 2015-03-31 DIAGNOSIS — X58XXXA Exposure to other specified factors, initial encounter: Secondary | ICD-10-CM | POA: Diagnosis present

## 2015-03-31 DIAGNOSIS — Y929 Unspecified place or not applicable: Secondary | ICD-10-CM

## 2015-03-31 DIAGNOSIS — Z79899 Other long term (current) drug therapy: Secondary | ICD-10-CM

## 2015-03-31 DIAGNOSIS — I10 Essential (primary) hypertension: Secondary | ICD-10-CM | POA: Diagnosis present

## 2015-03-31 DIAGNOSIS — N390 Urinary tract infection, site not specified: Secondary | ICD-10-CM | POA: Diagnosis present

## 2015-03-31 DIAGNOSIS — Z880 Allergy status to penicillin: Secondary | ICD-10-CM

## 2015-03-31 DIAGNOSIS — Z9049 Acquired absence of other specified parts of digestive tract: Secondary | ICD-10-CM

## 2015-03-31 DIAGNOSIS — I472 Ventricular tachycardia: Secondary | ICD-10-CM | POA: Diagnosis present

## 2015-03-31 DIAGNOSIS — I4729 Other ventricular tachycardia: Secondary | ICD-10-CM

## 2015-03-31 MED ORDER — ALBUTEROL SULFATE (2.5 MG/3ML) 0.083% IN NEBU
5.0000 mg | INHALATION_SOLUTION | Freq: Once | RESPIRATORY_TRACT | Status: AC
  Administered 2015-04-01: 5 mg via RESPIRATORY_TRACT
  Filled 2015-03-31: qty 6

## 2015-03-31 NOTE — ED Notes (Signed)
Bed: ZO10 Expected date:  Expected time:  Means of arrival:  Comments: EMS 54 yo male from shelter/SOB and chest wall pain, NPC x 3 weeks, orthopnea-decreased breath sounds throughout-O2 sat 88%-neb in progress

## 2015-03-31 NOTE — ED Notes (Signed)
Patient arrives by EMS from shelter with complaints of cough for 3 weeks, orthopnea.  EMS states wheezing, administered Albuterol 5 mg and Atrovent 0.5 mg

## 2015-04-01 ENCOUNTER — Encounter (HOSPITAL_COMMUNITY): Payer: Self-pay | Admitting: Family Medicine

## 2015-04-01 DIAGNOSIS — I472 Ventricular tachycardia: Secondary | ICD-10-CM

## 2015-04-01 DIAGNOSIS — J189 Pneumonia, unspecified organism: Secondary | ICD-10-CM | POA: Diagnosis present

## 2015-04-01 DIAGNOSIS — I2510 Atherosclerotic heart disease of native coronary artery without angina pectoris: Secondary | ICD-10-CM

## 2015-04-01 DIAGNOSIS — Z720 Tobacco use: Secondary | ICD-10-CM

## 2015-04-01 LAB — COMPREHENSIVE METABOLIC PANEL
ALK PHOS: 69 U/L (ref 38–126)
ALT: 81 U/L — ABNORMAL HIGH (ref 17–63)
ANION GAP: 11 (ref 5–15)
AST: 121 U/L — ABNORMAL HIGH (ref 15–41)
Albumin: 3.3 g/dL — ABNORMAL LOW (ref 3.5–5.0)
BUN: 18 mg/dL (ref 6–20)
CALCIUM: 9.1 mg/dL (ref 8.9–10.3)
CHLORIDE: 102 mmol/L (ref 101–111)
CO2: 23 mmol/L (ref 22–32)
Creatinine, Ser: 0.8 mg/dL (ref 0.61–1.24)
GFR calc non Af Amer: >60 mL/min (ref >60)
Glucose, Bld: 128 mg/dL — ABNORMAL HIGH (ref 65–99)
Potassium: 3.9 mmol/L (ref 3.5–5.1)
SODIUM: 136 mmol/L (ref 135–145)
Total Bilirubin: 0.3 mg/dL (ref 0.3–1.2)
Total Protein: 7.7 g/dL (ref 6.5–8.1)

## 2015-04-01 LAB — BRAIN NATRIURETIC PEPTIDE: B Natriuretic Peptide: 95.7 pg/mL (ref 0.0–100.0)

## 2015-04-01 LAB — URINALYSIS, ROUTINE W REFLEX MICROSCOPIC
BILIRUBIN URINE: NEGATIVE
GLUCOSE, UA: NEGATIVE mg/dL
Ketones, ur: 15 mg/dL — AB
Nitrite: POSITIVE — AB
Protein, ur: 30 mg/dL — AB
SPECIFIC GRAVITY, URINE: 1.02 (ref 1.005–1.030)
UROBILINOGEN UA: 0.2 mg/dL (ref 0.0–1.0)
pH: 6 (ref 5.0–8.0)

## 2015-04-01 LAB — CBC WITH DIFFERENTIAL/PLATELET
Basophils Absolute: 0 10*3/uL (ref 0.0–0.1)
Basophils Relative: 0 %
EOS ABS: 0 10*3/uL (ref 0.0–0.7)
EOS PCT: 0 %
HCT: 38.4 % — ABNORMAL LOW (ref 39.0–52.0)
Hemoglobin: 13.4 g/dL (ref 13.0–17.0)
LYMPHS ABS: 2.1 10*3/uL (ref 0.7–4.0)
Lymphocytes Relative: 16 %
MCH: 30.2 pg (ref 26.0–34.0)
MCHC: 34.9 g/dL (ref 30.0–36.0)
MCV: 86.5 fL (ref 78.0–100.0)
MONO ABS: 2 10*3/uL — AB (ref 0.1–1.0)
MONOS PCT: 16 %
Neutro Abs: 8.7 10*3/uL — ABNORMAL HIGH (ref 1.7–7.7)
Neutrophils Relative %: 68 %
PLATELETS: 351 10*3/uL (ref 150–400)
RBC: 4.44 MIL/uL (ref 4.22–5.81)
RDW: 14.3 % (ref 11.5–15.5)
WBC: 12.9 10*3/uL — ABNORMAL HIGH (ref 4.0–10.5)

## 2015-04-01 LAB — BASIC METABOLIC PANEL
ANION GAP: 9 (ref 5–15)
BUN: 15 mg/dL (ref 6–20)
CO2: 23 mmol/L (ref 22–32)
Calcium: 8.3 mg/dL — ABNORMAL LOW (ref 8.9–10.3)
Chloride: 106 mmol/L (ref 101–111)
Creatinine, Ser: 0.71 mg/dL (ref 0.61–1.24)
GFR calc Af Amer: >60 mL/min (ref >60)
GFR calc non Af Amer: >60 mL/min (ref >60)
GLUCOSE: 107 mg/dL — AB (ref 65–99)
POTASSIUM: 4 mmol/L (ref 3.5–5.1)
Sodium: 138 mmol/L (ref 135–145)

## 2015-04-01 LAB — CBC
HEMATOCRIT: 33.9 % — AB (ref 39.0–52.0)
HEMOGLOBIN: 11.9 g/dL — AB (ref 13.0–17.0)
MCH: 30.2 pg (ref 26.0–34.0)
MCHC: 35.1 g/dL (ref 30.0–36.0)
MCV: 86 fL (ref 78.0–100.0)
Platelets: 318 10*3/uL (ref 150–400)
RBC: 3.94 MIL/uL — ABNORMAL LOW (ref 4.22–5.81)
RDW: 14.3 % (ref 11.5–15.5)
WBC: 12 10*3/uL — ABNORMAL HIGH (ref 4.0–10.5)

## 2015-04-01 LAB — TROPONIN I: Troponin I: <0.03 ng/mL (ref <0.031)

## 2015-04-01 LAB — LACTIC ACID, PLASMA
Lactic Acid, Venous: 1.2 mmol/L (ref 0.5–2.0)
Lactic Acid, Venous: 1.1 mmol/L (ref 0.5–2.0)

## 2015-04-01 LAB — HIV ANTIBODY (ROUTINE TESTING W REFLEX): HIV Screen 4th Generation wRfx: NONREACTIVE

## 2015-04-01 LAB — URINE MICROSCOPIC-ADD ON

## 2015-04-01 MED ORDER — MORPHINE SULFATE (PF) 2 MG/ML IV SOLN
1.0000 mg | INTRAVENOUS | Status: DC | PRN
  Administered 2015-04-01 (×2): 1 mg via INTRAVENOUS
  Filled 2015-04-01 (×2): qty 1

## 2015-04-01 MED ORDER — KETOROLAC TROMETHAMINE 15 MG/ML IJ SOLN
15.0000 mg | Freq: Once | INTRAMUSCULAR | Status: AC
  Administered 2015-04-01: 15 mg via INTRAVENOUS
  Filled 2015-04-01: qty 1

## 2015-04-01 MED ORDER — THIAMINE HCL 100 MG/ML IJ SOLN
100.0000 mg | Freq: Every day | INTRAMUSCULAR | Status: DC
  Filled 2015-04-01 (×3): qty 1

## 2015-04-01 MED ORDER — ACETAMINOPHEN 500 MG PO TABS: 1000.0000 mg | ORAL_TABLET | Freq: Once | ORAL | Status: DC

## 2015-04-01 MED ORDER — VITAMIN B-1 100 MG PO TABS
100.0000 mg | ORAL_TABLET | Freq: Every day | ORAL | Status: DC
  Administered 2015-04-01 – 2015-04-03 (×3): 100 mg via ORAL
  Filled 2015-04-01 (×3): qty 1

## 2015-04-01 MED ORDER — ACETAMINOPHEN 325 MG PO TABS
650.0000 mg | ORAL_TABLET | Freq: Once | ORAL | Status: AC
  Administered 2015-04-01: 650 mg via ORAL
  Filled 2015-04-01: qty 2

## 2015-04-01 MED ORDER — LEVOFLOXACIN IN D5W 750 MG/150ML IV SOLN
750.0000 mg | INTRAVENOUS | Status: DC
  Administered 2015-04-01 – 2015-04-02 (×2): 750 mg via INTRAVENOUS
  Filled 2015-04-01 (×3): qty 150

## 2015-04-01 MED ORDER — LORAZEPAM 2 MG/ML IJ SOLN: 1.0000 mg | Freq: Four times a day (QID) | INTRAMUSCULAR | Status: DC | PRN

## 2015-04-01 MED ORDER — GUAIFENESIN-DM 100-10 MG/5ML PO SYRP
5.0000 mL | ORAL_SOLUTION | ORAL | Status: DC | PRN
  Administered 2015-04-01 – 2015-04-02 (×2): 5 mL via ORAL
  Filled 2015-04-01 (×2): qty 10

## 2015-04-01 MED ORDER — MORPHINE SULFATE (PF) 4 MG/ML IV SOLN
6.0000 mg | Freq: Once | INTRAVENOUS | Status: AC
  Administered 2015-04-01: 6 mg via INTRAVENOUS
  Filled 2015-04-01: qty 2

## 2015-04-01 MED ORDER — SODIUM CHLORIDE 0.9 % IV SOLN
INTRAVENOUS | Status: DC
  Administered 2015-04-01 – 2015-04-03 (×6): via INTRAVENOUS

## 2015-04-01 MED ORDER — LEVOFLOXACIN 750 MG PO TABS: 750.0000 mg | ORAL_TABLET | Freq: Every day | ORAL | Status: DC

## 2015-04-01 MED ORDER — MORPHINE SULFATE (PF) 2 MG/ML IV SOLN
1.0000 mg | INTRAVENOUS | Status: DC | PRN
  Administered 2015-04-01 – 2015-04-02 (×4): 2 mg via INTRAVENOUS
  Filled 2015-04-01 (×4): qty 1

## 2015-04-01 MED ORDER — FOLIC ACID 1 MG PO TABS
1.0000 mg | ORAL_TABLET | Freq: Every day | ORAL | Status: DC
Start: 2015-04-01 — End: 2015-04-03
  Administered 2015-04-01 – 2015-04-03 (×3): 1 mg via ORAL
  Filled 2015-04-01 (×3): qty 1

## 2015-04-01 MED ORDER — HYDROCODONE-ACETAMINOPHEN 5-325 MG PO TABS
1.0000 | ORAL_TABLET | Freq: Four times a day (QID) | ORAL | Status: DC | PRN
  Administered 2015-04-01 – 2015-04-03 (×8): 1 via ORAL
  Filled 2015-04-01 (×8): qty 1

## 2015-04-01 MED ORDER — PNEUMOCOCCAL VAC POLYVALENT 25 MCG/0.5ML IJ INJ
0.5000 mL | INJECTION | INTRAMUSCULAR | Status: AC
  Administered 2015-04-02: 0.5 mL via INTRAMUSCULAR
  Filled 2015-04-01 (×2): qty 0.5

## 2015-04-01 MED ORDER — ENOXAPARIN SODIUM 40 MG/0.4ML ~~LOC~~ SOLN
40.0000 mg | SUBCUTANEOUS | Status: DC
  Administered 2015-04-01 – 2015-04-03 (×3): 40 mg via SUBCUTANEOUS
  Filled 2015-04-01 (×3): qty 0.4

## 2015-04-01 MED ORDER — LEVOFLOXACIN IN D5W 750 MG/150ML IV SOLN
750.0000 mg | Freq: Once | INTRAVENOUS | Status: AC
  Administered 2015-04-01: 750 mg via INTRAVENOUS
  Filled 2015-04-01: qty 150

## 2015-04-01 MED ORDER — LORAZEPAM 1 MG PO TABS
1.0000 mg | ORAL_TABLET | Freq: Four times a day (QID) | ORAL | Status: DC | PRN
  Administered 2015-04-02: 1 mg via ORAL
  Filled 2015-04-01: qty 1

## 2015-04-01 MED ORDER — ADULT MULTIVITAMIN W/MINERALS CH
1.0000 | ORAL_TABLET | Freq: Every day | ORAL | Status: DC
  Administered 2015-04-01 – 2015-04-03 (×3): 1 via ORAL
  Filled 2015-04-01 (×3): qty 1

## 2015-04-01 NOTE — ED Provider Notes (Signed)
MSE was initiated and I personally evaluated the patient and placed orders (if any) at  12:13 AM on April 01, 2015.  The patient appears stable so that the remainder of the MSE may be completed by another provider.  54yM with CP and SOB. Febrile, tachy, looks uncomfortable. Possible pneumonia. Seems atypical for ACS. Hypoxic. Placed on [place]. Normotensive. W/u initiated. EKG sinus tach w/o overt ischemic changes. Additional studies ordered. Tylenol, pain meds.   Raeford Razor, MD 04/01/15 [phone]

## 2015-04-01 NOTE — Progress Notes (Signed)
[facility] PROGRESS NOTE  Scott Berry WJX:[id number] DOB: June 12, 1961 DOA: 03/31/2015 PCP: No primary care Scott Berry on file.  Assessment/Plan: 1-CAP, multifocal PNA Chest x ray showed multifocal PNA.  Needs Chest x ray in 4 weeks to document resolution.  HIV negative.   2-Acute Hypoxic Respiratory failure;  In setting of PNA.  Will need home oxygen evaluation.    3-Coronary artery disease and hypertension CAD- cath 05/05/14- med Rx- possible PCI in the future  4-History of ETOH use; will start CIWA protocol.   5-Transaminases; could be related to alcohol use. Will check hepatitis panel.   6-UTI; UA: positive nitrates. On Levaquin.   Code Status: Full Code.  Family Communication: care discussed with patient.  Disposition Plan: Remain inpatient for IV antibiotics, treatment of PNA   Consultants:  none  Procedures:  none  Antibiotics:  Levaquin IV   HPI/Subjective: He is feeling ok, still coughing, breathing better   Objective: Filed Vitals:   04/01/15 0315  BP: 121/65  Pulse: 85  Temp: 98.2 F (36.8 C)  Resp: 18    Intake/Output Summary (Last 24 hours) at 04/01/15 0927 Last data filed at 04/01/15 0900  Gross per 24 hour  Intake   1115 ml  Output    525 ml  Net    590 ml   Filed Weights   04/01/15 0315  Weight: 58.968 kg (130 lb)    Exam:   General:  Alert in no distress  Cardiovascular: S 1, S 2 RRR  Respiratory: Bilateral ronchus.   Abdomen: Bs present, soft, nt  Musculoskeletal: no edema  Data Reviewed: Basic Metabolic Panel:  Recent Labs Lab 04/01/15 04/01/15 0603  NA 136 138  K 3.9 4.0  CL 102 106  CO2 23 23  GLUCOSE 128* 107*  BUN 18 15  CREATININE 0.80 0.71  CALCIUM 9.1 8.3*   Liver Function Tests:  Recent Labs Lab 04/01/15  AST 121*  ALT 81*  ALKPHOS 69  BILITOT 0.3  PROT 7.7  ALBUMIN 3.3*   No results for input(s): LIPASE, AMYLASE in the last 168 hours. No results for input(s): AMMONIA in the last  168 hours. CBC:  Recent Labs Lab 04/01/15 04/01/15 0603  WBC 12.9* 12.0*  NEUTROABS 8.7*  --   HGB 13.4 11.9*  HCT 38.4* 33.9*  MCV 86.5 86.0  PLT 351 318   Cardiac Enzymes:  Recent Labs Lab 04/01/15  TROPONINI <0.03   BNP (last 3 results)  Recent Labs  04/01/15  BNP 95.7    ProBNP (last 3 results) No results for input(s): PROBNP in the last 8760 hours.  CBG: No results for input(s): GLUCAP in the last 168 hours.  No results found for this or any previous visit (from the past 240 hour(s)).   Studies: Dg Chest 2 View  04/01/2015   CLINICAL DATA:  Shortness of breath, upper mid chest pain for 24 hours. Nonproductive cough for 2 weeks. History of hypertension, tobacco abuse.  EXAM: CHEST  2 VIEW  COMPARISON:  Chest radiograph May 05, 2014  FINDINGS: Patchy consolidation in RIGHT mid lung zone, bilateral lower lung zones. Increased lung volumes with flattened hemidiaphragms consistent with COPD. Cardiac silhouette is unremarkable, mildly calcified aortic knob. No pneumothorax. Soft tissue planes and included osseous structures are nonsuspicious.  IMPRESSION: Multifocal pneumonia. Followup PA and lateral chest X-ray is recommended in 3-4 weeks following trial of antibiotic therapy to ensure resolution and exclude underlying malignancy.   Electronically Signed   By: Awilda Metro  M.D.   On: 04/01/2015 00:34    Scheduled Meds: . enoxaparin (LOVENOX) injection  40 mg Subcutaneous Q24H  . levofloxacin  750 mg Oral QHS  . [START ON 04/02/2015] pneumococcal 23 valent vaccine  0.5 mL Intramuscular Tomorrow-1000   Continuous Infusions: . sodium chloride 150 mL/hr at 04/01/15 1191    Principal Problem:   CAP (community acquired pneumonia) Active Problems:   Hypertension   Tobacco abuse   Ventricular tachycardia, non-sustained   CAD- cath 05/05/14- med Rx- possible PCI in the future   Dyslipidemia, goal LDL below 70    Time spent: 35 minutes.     [name]  [facility] Pager [phone]. If 7PM-7AM, please contact night-coverage at [email or web address], password [facility] 04/01/2015, 9:27 AM  LOS: 0 days

## 2015-04-01 NOTE — ED Notes (Signed)
One set of cultures sent to the lab.  BNP and Trop 1 sent as add on

## 2015-04-01 NOTE — Care Management (Signed)
This Case Manager received communication from Lanier Clam, RN CM who indicated patient homeless, uninsured, needing hospital follow-up appointment. Hospital follow-up appointment obtained on 04/05/15 at 1330 at [facility]. Patient will have access to [facility] and [facility] pharmacy (pharmacy closed on weekend) for medications if needed upon discharge. AVS updated. Lanier Clam, RN CM updated.

## 2015-04-01 NOTE — Care Management Note (Signed)
Case Management Note  Patient Details  Name: Scott Berry MRN: [id number] Date of Birth: 07-15-1960  Subjective/Objective:  54 y/o m admitted w/PNA. Homeless-stays @ [employer].No insurance-referral to [facility] for pcp appt, resources-insurance, meds-[facility] pharmacy.Patient in agreement to [facility], & [facility] services.May need home 02.Pt cons-await recommendations. CSW following.                 Action/Plan:d/c plan return [employer].   Expected Discharge Date:                  Expected Discharge Plan:  Homeless Shelter  In-House Referral:  Clinical Social Work  Discharge planning Services  CM Consult, [facility]  Post Acute Care Choice:    Choice offered to:     DME Arranged:    DME Agency:     HH Arranged:    HH Agency:     Status of Service:  In process, will continue to follow  Medicare Important Message Given:    Date Medicare IM Given:    Medicare IM give by:    Date Additional Medicare IM Given:    Additional Medicare Important Message give by:     If discussed at Long Length of Stay Meetings, dates discussed:    Additional Comments:  Lanier Clam, RN 04/01/2015, 12:12 PM

## 2015-04-01 NOTE — ED Provider Notes (Signed)
CSN: [id number]     Arrival date & time 03/31/15  2332 History  By signing my name below, I, Terrance Branch, attest that this documentation has been prepared under the direction and in the presence of Tomasita Crumble, MD. Electronically Signed: Evon Slack, ED Scribe. 04/01/2015. 1:13 AM.    Chief Complaint  Patient presents with  . Shortness of Breath   Patient is a 54 y.o. male presenting with shortness of breath. The history is provided by the patient. No language interpreter was used.  Shortness of Breath Severity:  Moderate Onset quality:  Gradual Duration:  3 days Chronicity:  New Relieved by:  Nothing Associated symptoms: chest pain and cough     HPI Comments: Scott Berry is a 54 y.o. male brought in by ambulance, who presents to the Emergency Department complaining of  SOB onset 3 days prior. Pt states he is having associated right sided CP and cough. Per EMS pt received albuterol and Atrovent treatment PTA. Pt denies fever or other related symptoms.   Past Medical History  Diagnosis Date  . Hypertension   . Sleep apnea 1995    not a problem now  . Shortness of breath     if don't take medicines  . 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 Polysubstance abuse in past 08/15/2011  . Personal history of colonic polyps 08/15/2011  . Tobacco abuse 08/15/2011  . Pure hypercholesterolemia   . Coronary artery arteriosclerosis 2011    cath with long tubular heavily calcified mid LAD 60%, calcified RCA with multiple 50% stenosis, ostial left circ stenosis of 30% and calcified LM with no stenosis on medical management   Past Surgical History  Procedure Laterality Date  . Foreign body removal rectal  Oct 2011  . Colon surgery  14Jun2012    colectomy/diverting ileostomy - colon perf  . Ileo loop diversion  06/21/2011    Procedure: Takedown of loop ileostomy  . Loop ileostomy takedown  20Dec2012    Procedure: Takedown of loop ileostomy  .  Cardiac catheterization  09/10/2009    Results in chart  . Left heart catheterization with coronary angiogram N/A 05/05/2014    Procedure: LEFT HEART CATHETERIZATION WITH CORONARY ANGIOGRAM;  Surgeon: Micheline Chapman, MD;  Location: [facility] CATH LAB;  Service: Cardiovascular;  Laterality: N/A;  . Percutaneous coronary stent intervention (pci-s) Right 05/05/2014    Procedure: PERCUTANEOUS CORONARY STENT INTERVENTION (PCI-S);  Surgeon: Micheline Chapman, MD;  Location: [facility] CATH LAB;  Service: Cardiovascular;  Laterality: Right;  Prox RCA   Family History  Problem Relation Age of Onset  . Stroke Father   . Stroke Mother    Social History  Substance Use Topics  . Smoking status: Current Every Day Smoker -- 0.50 packs/day    Types: Cigarettes    Last Attempt to Quit: 07/03/2007  . Smokeless tobacco: Never Used  . Alcohol Use: 1.8 oz/week    3 Cans of beer per week     Comment: Recovered alcoholic x 6 years    Review of Systems  Respiratory: Positive for cough and shortness of breath.   Cardiovascular: Positive for chest pain.  All other systems reviewed and are negative.    Allergies  Penicillins  Home Medications   Prior to Admission medications   Medication Sig Start Date End Date Taking? Authorizing Keeley Sussman  isosorbide mononitrate (IMDUR) 60 MG 24 hr tablet Take 0.5 tablets (30 mg total) by mouth 2 (two) times daily.  10/14/14  Yes [name], MD  nitroGLYCERIN (NITROSTAT) 0.4 MG SL tablet Place 1 tablet (0.4 mg total) under the tongue every 5 (five) minutes as needed for chest pain. 11/01/14  Yes [name], MD   BP 120/68 mmHg  Pulse 113  Temp(Src) 101.8 F (38.8 C) (Rectal)  Resp 20  SpO2 93%   Physical Exam  Constitutional: He is oriented to person, place, and time. Vital signs are normal. He appears well-developed and well-nourished.  Non-toxic appearance. He does not appear ill. No distress.  Tactile fever.   HENT:  Head: Normocephalic and atraumatic.  Nose: Nose  normal.  Mouth/Throat: Oropharynx is clear and moist. No oropharyngeal exudate.  Eyes: Conjunctivae and EOM are normal. Pupils are equal, round, and reactive to light. No scleral icterus.  Neck: Normal range of motion. Neck supple. No tracheal deviation, no edema, no erythema and normal range of motion present. No thyroid mass and no thyromegaly present.  Cardiovascular: Regular rhythm, S1 normal, S2 normal, normal heart sounds, intact distal pulses and normal pulses.  Tachycardia present.  Exam reveals no gallop and no friction rub.   No murmur heard. Pulses:      Radial pulses are 2+ on the right side, and 2+ on the left side.       Dorsalis pedis pulses are 2+ on the right side, and 2+ on the left side.  Pulmonary/Chest: Effort normal and breath sounds normal. No respiratory distress. He has no wheezes. He has no rhonchi. He has no rales.  increased work of breathing.   Abdominal: Soft. Normal appearance and bowel sounds are normal. He exhibits no distension, no ascites and no mass. There is no hepatosplenomegaly. There is no tenderness. There is no rebound, no guarding and no CVA tenderness.  Musculoskeletal: Normal range of motion. He exhibits no edema or tenderness.  Lymphadenopathy:    He has no cervical adenopathy.  Neurological: He is alert and oriented to person, place, and time. He has normal strength. No cranial nerve deficit or sensory deficit.  Skin: Skin is warm, dry and intact. No petechiae and no rash noted. He is not diaphoretic. No erythema. No pallor.  Psychiatric: He has a normal mood and affect. His behavior is normal. Judgment normal.  Nursing note and vitals reviewed.   ED Course  Procedures (including critical care time) DIAGNOSTIC STUDIES: Oxygen Saturation is 96% on 2L [place], adequate by my interpretation.    COORDINATION OF CARE: 1:11 AM-Discussed treatment plan with pt at bedside and pt agreed to plan.     Labs Review Labs Reviewed  CBC WITH  DIFFERENTIAL/PLATELET - Abnormal; Notable for the following:    WBC 12.9 (*)    HCT 38.4 (*)    Neutro Abs 8.7 (*)    Monocytes Absolute 2.0 (*)    All other components within normal limits  COMPREHENSIVE METABOLIC PANEL - Abnormal; Notable for the following:    Glucose, Bld 128 (*)    Albumin 3.3 (*)    AST 121 (*)    ALT 81 (*)    All other components within normal limits  CULTURE, BLOOD (ROUTINE X 2)  CULTURE, BLOOD (ROUTINE X 2)  LACTIC ACID, PLASMA  TROPONIN I  BRAIN NATRIURETIC PEPTIDE  LACTIC ACID, PLASMA  URINALYSIS, ROUTINE W REFLEX MICROSCOPIC (NOT AT [facility])    Imaging Review Dg Chest 2 View  04/01/2015   CLINICAL DATA:  Shortness of breath, upper mid chest pain for 24 hours. Nonproductive cough for 2  weeks. History of hypertension, tobacco abuse.  EXAM: CHEST  2 VIEW  COMPARISON:  Chest radiograph May 05, 2014  FINDINGS: Patchy consolidation in RIGHT mid lung zone, bilateral lower lung zones. Increased lung volumes with flattened hemidiaphragms consistent with COPD. Cardiac silhouette is unremarkable, mildly calcified aortic knob. No pneumothorax. Soft tissue planes and included osseous structures are nonsuspicious.  IMPRESSION: Multifocal pneumonia. Followup PA and lateral chest X-ray is recommended in 3-4 weeks following trial of antibiotic therapy to ensure resolution and exclude underlying malignancy.   Electronically Signed   By: Awilda Metro M.D.   On: 04/01/2015 00:34   I have personally reviewed and evaluated these images and lab results as part of my medical decision-making.   EKG Interpretation   Date/Time:  Thursday March 31 2015 23:44:59 EDT Ventricular Rate:  120 PR Interval:  110 QRS Duration: 80 QT Interval:  317 QTC Calculation: 448 R Axis:   85 Text Interpretation:  Sinus tachycardia Confirmed by Juleen China  MD, STEPHEN  ([id number]) on 04/01/2015 12:14:37 AM      MDM   Final diagnoses:  None     Patient presents to the emergency department  for chest pain and shortness of breath over the past several days. Chest x-ray reveals multifocal pneumonia, patient was hypoxic to 86% on room air requiring 3 L nasal cannula. He will be admitted for further care. Sepsis protocol order, he was given Levaquin for treatment. He was given Tylenol for his fever. I spoke with the [facility] hospital[facility] to admit the patient to MedSurg unit for further care.  I personally performed the services described in this documentation, which was scribed in my presence. The recorded information has been reviewed and is accurate.     Tomasita Crumble, MD 04/01/15 [phone]

## 2015-04-01 NOTE — H&P (Signed)
History and Physical  Scott Berry  ZOX:[id number]  DOB: 10-14-1960  DOA: 03/31/2015  Referring physician: Tomasita Crumble, MD PCP: No primary care provider on file.   Chief Complaint: "I've had this cough which keeps getting worse"  HPI: Scott Berry is a 54 y.o. male with a past medical history significant for CAD,  colon perforation status post colectomy, hypertension, and hyperlipidemia who presents with 3 days worsening fever, shortness of breath, and productive cough   The patient has had a lingering cough for several weeks, but over the last 3 days he has developed new fever, purulent sputum, and shortness of breath.    In the ED, the patient's chest x-ray showed multifocal opacities, he had leukocytosis, fever, and tachycardia. Lactate level was normal. Blood cultures were drawn and he was started on Levaquin and admitted to [facility] for CAP.   Review of Systems:  Patient seen 2:20 AM on 04/01/2015. Pt complains of cough, shortness of breath, purulent sputum, weakness, fever  Pt denies any  Indolent fevers, hemoptysis.  Twelve systems were reviewed and were negative except as noted above in the history of present illness.  Past Medical History  Diagnosis Date  . Hypertension   . Sleep apnea 1995    not a problem now  . Shortness of breath     if don't take medicines  . 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 Polysubstance abuse in past 08/15/2011  . Personal history of colonic polyps 08/15/2011  . Tobacco abuse 08/15/2011  . Pure hypercholesterolemia   . Coronary artery arteriosclerosis 2011    cath with long tubular heavily calcified mid LAD 60%, calcified RCA with multiple 50% stenosis, ostial left circ stenosis of 30% and calcified LM with no stenosis on medical management  The above past medical history was reviewed.  Past Surgical History  Procedure Laterality Date  . Foreign body removal rectal  Oct 2011  . Colon surgery  14Jun2012      colectomy/diverting ileostomy - colon perf  . Ileo loop diversion  06/21/2011    Procedure: Takedown of loop ileostomy  . Loop ileostomy takedown  20Dec2012    Procedure: Takedown of loop ileostomy  . Cardiac catheterization  09/10/2009    Results in chart  . Left heart catheterization with coronary angiogram N/A 05/05/2014    Procedure: LEFT HEART CATHETERIZATION WITH CORONARY ANGIOGRAM;  Surgeon: Micheline Chapman, MD;  Location: [facility] CATH LAB;  Service: Cardiovascular;  Laterality: N/A;  . Percutaneous coronary stent intervention (pci-s) Right 05/05/2014    Procedure: PERCUTANEOUS CORONARY STENT INTERVENTION (PCI-S);  Surgeon: Micheline Chapman, MD;  Location: [facility] CATH LAB;  Service: Cardiovascular;  Laterality: Right;  Prox RCA  The above surgical history was reviewed.  Social History: [facility] the homeless shelter right now.  he formerly lived in his own apartment until about a month ago. He is a picture framer. He currently smokes. He denies using illicit drugs or use alcohol.    Allergies  Allergen Reactions  . Penicillins Anaphylaxis    Has patient had a PCN reaction causing immediate rash, facial/tongue/throat swelling, SOB or lightheadedness with hypotension: Yes Has patient had a PCN reaction causing severe rash involving mucus membranes or skin necrosis:No Has patient had a PCN reaction that required hospitalization No Has patient had a PCN reaction occurring within the last 10 years: No If all of the above answers are "NO", then may proceed with Cephalosporin use.  Family History  Problem Relation Age of Onset  . Stroke Father   . Pancreatic disease Father   . Stroke Mother     Prior to Admission medications   Medication Sig Start Date End Date Taking? Authorizing Provider  isosorbide mononitrate (IMDUR) 60 MG 24 hr tablet Take 0.5 tablets (30 mg total) by mouth 2 (two) times daily. 10/14/14  Yes [name], MD  nitroGLYCERIN (NITROSTAT) 0.4 MG SL tablet Place 1  tablet (0.4 mg total) under the tongue every 5 (five) minutes as needed for chest pain. 11/01/14  Yes [name], MD    Physical Exam: BP 120/68 mmHg  Pulse 113  Temp(Src) 101.8 F (38.8 C) (Rectal)  Resp 20  SpO2 94% General: Adult male, alert and in moderate distresfrom illness  Responds appropriately to questions.  Eye contact, dress and hygiene appropriate. HEENT: Head normal.  Corneas clear, conjunctivae and sclerae normal without injection or icterus, lids and lashes normal.  PERRL and EOMI.  Nose normal.  OP moist without erythema, exudates, cobblestoning, or ulcers.  No airway deformities.  Neck supple. No thyromegaly.  Lymph: No cervical or axillary lymphadenopathy. Skin: Warm and flushed.  No jaundice.  No suspicious rashes or lesions. Cardiac:  tachycardic, regular, nl S1-S2, no murmurs appreciated.  Capillary refill is less than 2 seconds. Respiratory: Normal respiratory rate and rhythm.   low pitched rhonchi diffusely, Rales in right base Abdomen: BS present.  No TTP or rebound all quadrants.  abdomen soft. Neuro: Negative    Psych: Negative           Labs on Admission:  The metabolic panel is notable fnormal sodium, potassium, bicarbonate, and renal function. Lactic acid 1.2 mmol/L. The complete blood count is notable forleukocytosis 12.9 Kul, normal platelets and Hgb.     Radiological Exams on Admission: Personally reviewed: Dg Chest 2 View 04/01/2015 Multifocal infiltrates.  Repeat CXR recommended in 4-6 weeks to document resolution.    EKG: Independently reviewed. Sinus tachycardia.  Assessment/Plan Principal Problem:   CAP (community acquired pneumonia) Active Problems:   Hypertension   Tobacco abuse   Ventricular tachycardia, non-sustained   CAD- cath 05/05/14- med Rx- possible PCI in the future   Dyslipidemia, goal LDL below 70    1. CAP:  The patient has fever, productive cough, leukocytosis and tachycardia, and chest x-ray with multifocal  opacities. He lives in a homeless shelter. He has no other risk factors are present for drug-resistant disease.  -Levaquin 750 mg daily -Sputum culture -HIV -Follow up blood cultures -Incentive spirometry -Supplemental oxygen as needed  2. Coronary artery disease and hypertension:  The patient has coronary artery disease as documented on catheterization 05/05/2014, but is not currently on baby aspirin, statin therapy, beta blocker or ACE. -The patient is encouraged to obtain regular outpatient follow-up       DVT PPx: Lovenox Code Status: Full  Disposition Plan:  The appropriate admission status for this patient is INPATIENT. Inpatient status is judged to be reasonable and necessary in order to provide the required intensity of service to ensure the patient's safety. The patient's presenting symptoms, physical exam findings, and initial radiographic and laboratory data in the context of their chronic comorbidities is felt to place them at high risk for further clinical deterioration. Furthermore, it is not anticipated that the patient will be medically stable for discharge from the hospital within 2 midnights of admission. The following factors support the admission status of inpatient.   A. The patient's presenting  symptoms include fever, cough, dyspnea. B. The worrisome physical exam findings include tachycardia. C. The initial radiographic and laboratory data are worrisome because of chest opacities, leukocytosis. D. The chronic co-morbidities include CAD. E. Patient requires inpatient status due to high intensity of service, high risk for further deterioration and high frequency of surveillance required. F. I certify that at the point of admission it is my clinical judgment that the patient will require inpatient hospital care spanning beyond 2 midnights from the point of admission.     Alberteen Sam [facility] Pager [phone]

## 2015-04-01 NOTE — Clinical Social Work Note (Signed)
Clinical Social Work Assessment  Patient Details  Name: Scott Berry MRN: [id number] Date of Birth: 11/20/1960  Date of referral:  04/01/15               Reason for consult:  Housing Concerns/Homelessness                Permission sought to share information with:  Facility Contact Representative ([facility]) Permission granted to share information::  Yes, Verbal Permission Granted  Name::     [facility]  Agency::  [facility]  Relationship::  Homeless shelter  Contact Information:  [phone]  Housing/Transportation Living arrangements for the past 2 months:  Homeless Shelter Source of Information:  Patient Patient Interpreter Needed:  None Criminal Activity/Legal Involvement Pertinent to Current Situation/Hospitalization:  No - Comment as needed Significant Relationships:  None Lives with:  Facility Resident Do you feel safe going back to the place where you live?  Yes Need for family participation in patient care:  No (Coment)  Care giving concerns: Pt from [facility]   Social Worker assessment / plan:  CSW met with pt at bedside to complete psychosocial assessment. Pt shares that he is a resident at [facility]. Pt shares he has been calling to speak with someone but hasn't been able to speak with anyone at the shelter. CSW offered to reach out ot the shelter for patient. Pt thanked csw for support. CSW confirmed with Maggie-supervisor at [facility] that patient can return when medically stable. Patient informed who thanked csw for concern and support. Pt is familiar with the [facility].   Employment status:  Unemployed Insurance information:  Self Pay (Medicaid Pending) PT Recommendations:  Not assessed at this time Information / Referral to community resources:   (none identified at htis time)  Patient/Family's Response to care:  Pt appreciate of csw concern and support.  Patient/Family's Understanding of and Emotional Response to  Diagnosis, Current Treatment, and Prognosis: Pt verbalized understanding of current prognosis and treatment plan. Pt states that he is hopeful to discharge over the weekend or first of next week.   Emotional Assessment Appearance:  Appears stated age Attitude/Demeanor/Rapport:   (calm coopeartive) Affect (typically observed):  Accepting, Calm, Appropriate Orientation:  Oriented to Self, Oriented to Place, Oriented to  Time, Oriented to Situation Alcohol / Substance use:  Not Applicable Psych involvement (Current and /or in the community):  No (Comment)  Discharge Needs  Concerns to be addressed:  Homelessness Readmission within the last 30 days:  No Current discharge risk:  Homeless Barriers to Discharge:  No Barriers Identified   REED, KRISTEN A, LCSW 04/01/2015, 3:05 PM  

## 2015-04-02 DIAGNOSIS — E785 Hyperlipidemia, unspecified: Secondary | ICD-10-CM

## 2015-04-02 DIAGNOSIS — I10 Essential (primary) hypertension: Secondary | ICD-10-CM

## 2015-04-02 DIAGNOSIS — J189 Pneumonia, unspecified organism: Principal | ICD-10-CM

## 2015-04-02 LAB — CBC
HEMATOCRIT: 35.7 % — AB (ref 39.0–52.0)
Hemoglobin: 12.4 g/dL — ABNORMAL LOW (ref 13.0–17.0)
MCH: 30.2 pg (ref 26.0–34.0)
MCHC: 34.7 g/dL (ref 30.0–36.0)
MCV: 86.9 fL (ref 78.0–100.0)
PLATELETS: 369 10*3/uL (ref 150–400)
RBC: 4.11 MIL/uL — ABNORMAL LOW (ref 4.22–5.81)
RDW: 14.5 % (ref 11.5–15.5)
WBC: 10.6 10*3/uL — AB (ref 4.0–10.5)

## 2015-04-02 LAB — BASIC METABOLIC PANEL
ANION GAP: 8 (ref 5–15)
BUN: 12 mg/dL (ref 6–20)
CO2: 23 mmol/L (ref 22–32)
CREATININE: 0.64 mg/dL (ref 0.61–1.24)
Calcium: 8.8 mg/dL — ABNORMAL LOW (ref 8.9–10.3)
Chloride: 107 mmol/L (ref 101–111)
GFR calc non Af Amer: >60 mL/min (ref >60)
GLUCOSE: 90 mg/dL (ref 65–99)
Potassium: 4.2 mmol/L (ref 3.5–5.1)
Sodium: 138 mmol/L (ref 135–145)

## 2015-04-02 LAB — HEPATITIS PANEL, ACUTE
HCV Ab: <0.1 s/co ratio (ref 0.0–0.9)
HEP B C IGM: NEGATIVE
Hep A IgM: NEGATIVE
Hepatitis B Surface Ag: NEGATIVE

## 2015-04-02 NOTE — Progress Notes (Signed)
OT Note  Patient Details Name: Scott Berry MRN: [id number] DOB: 06-17-1961   Cancelled Treatment:    Reason Eval/Treat Not Completed: OT screened, no needs identified, will sign off  Per PT pt in I with ADL activity. Thank you for this consult  Dedria Endres, Metro Kung 04/02/2015, 12:01 PM

## 2015-04-02 NOTE — Progress Notes (Signed)
[facility] PROGRESS NOTE  Scott Berry ZOX:[id number] DOB: 1961-01-01 DOA: 03/31/2015 PCP: No primary care provider on file.  Assessment/Plan: 1-CAP, multifocal PNA Chest x ray showed multifocal PNA.  Needs Chest x ray in 4 weeks to document resolution.  HIV negative.  Continue with IV antibiotics day 3.   2-Acute Hypoxic Respiratory failure;  In setting of PNA.  Oxygen sat 90 5 on ambulation/    3-Coronary artery disease and hypertension CAD- cath 05/05/14- med Rx- possible PCI in the future  4-History of ETOH use; will start CIWA protocol.   5-Transaminases; could be related to alcohol use.  hepatitis panel pending.  Hepatitis panel negative.  6-UTI; UA: positive nitrates. On Levaquin.  Urine culture no growth to date.   Code Status: Full Code.  Family Communication: care discussed with patient.  Disposition Plan: discharge 10-02   Consultants:  none  Procedures:  none  Antibiotics:  Levaquin IV   HPI/Subjective: Feeling better, cough improved, but still with coughing spell.   Objective: Filed Vitals:   04/02/15 0611  BP: 134/68  Pulse: 77  Temp: 97.7 F (36.5 C)  Resp: 20    Intake/Output Summary (Last 24 hours) at 04/02/15 1256 Last data filed at 04/02/15 1010  Gross per 24 hour  Intake 966.67 ml  Output   1825 ml  Net -858.33 ml   Filed Weights   04/01/15 0315  Weight: 58.968 kg (130 lb)    Exam:   General:  Alert in no distress  Cardiovascular: S 1, S 2 RRR  Respiratory: Bilateral ronchus.   Abdomen: Bs present, soft, nt  Musculoskeletal: no edema  Data Reviewed: Basic Metabolic Panel:  Recent Labs Lab 04/01/15 04/01/15 0603 04/02/15 0545  NA 136 138 138  K 3.9 4.0 4.2  CL 102 106 107  CO2 GLUCOSE 128* 107* 90  BUN CREATININE 0.80 0.71 0.64  CALCIUM 9.1 8.3* 8.8*   Liver Function Tests:  Recent Labs Lab 04/01/15  AST 121*  ALT 81*  ALKPHOS 69  BILITOT 0.3  PROT 7.7  ALBUMIN  3.3*   No results for input(s): LIPASE, AMYLASE in the last 168 hours. No results for input(s): AMMONIA in the last 168 hours. CBC:  Recent Labs Lab 04/01/15 04/01/15 0603 04/02/15 0545  WBC 12.9* 12.0* 10.6*  NEUTROABS 8.7*  --   --   HGB 13.4 11.9* 12.4*  HCT 38.4* 33.9* 35.7*  MCV 86.5 86.0 86.9  PLT 351 318 369   Cardiac Enzymes:  Recent Labs Lab 04/01/15  TROPONINI <0.03   BNP (last 3 results)  Recent Labs  04/01/15  BNP 95.7    ProBNP (last 3 results) No results for input(s): PROBNP in the last 8760 hours.  CBG: No results for input(s): GLUCAP in the last 168 hours.  Recent Results (from the past 240 hour(s))  Culture, Urine     Status: None (Preliminary result)   Collection Time: 04/01/15  5:53 PM  Result Value Ref Range Status   Specimen Description URINE, CLEAN CATCH  Final   Special Requests NONE  Final   Culture   Final    NO GROWTH < 12 HOURS Performed at [facility]    Report Status PENDING  Incomplete     Studies: Dg Chest 2 View  04/01/2015   CLINICAL DATA:  Shortness of breath, upper mid chest pain for 24 hours. Nonproductive cough for 2 weeks. History of hypertension, tobacco abuse.  EXAM:  CHEST  2 VIEW  COMPARISON:  Chest radiograph May 05, 2014  FINDINGS: Patchy consolidation in RIGHT mid lung zone, bilateral lower lung zones. Increased lung volumes with flattened hemidiaphragms consistent with COPD. Cardiac silhouette is unremarkable, mildly calcified aortic knob. No pneumothorax. Soft tissue planes and included osseous structures are nonsuspicious.  IMPRESSION: Multifocal pneumonia. Followup PA and lateral chest X-ray is recommended in 3-4 weeks following trial of antibiotic therapy to ensure resolution and exclude underlying malignancy.   Electronically Signed   By: Awilda Metro M.D.   On: 04/01/2015 00:34    Scheduled Meds: . enoxaparin (LOVENOX) injection  40 mg Subcutaneous Q24H  . folic acid  1 mg Oral Daily  .  levofloxacin (LEVAQUIN) IV  750 mg Intravenous Q24H  . multivitamin with minerals  1 tablet Oral Daily  . thiamine  100 mg Oral Daily   Or  . thiamine  100 mg Intravenous Daily   Continuous Infusions: . sodium chloride 100 mL/hr at 04/02/15 0303    Principal Problem:   CAP (community acquired pneumonia) Active Problems:   Hypertension   Tobacco abuse   Ventricular tachycardia, non-sustained   CAD- cath 05/05/14- med Rx- possible PCI in the future   Dyslipidemia, goal LDL below 70    Time spent: 35 minutes.     [name]  [facility] Pager [phone]. If 7PM-7AM, please contact night-coverage at [email or web address], password [facility] 04/02/2015, 12:56 PM  LOS: 1 day

## 2015-04-02 NOTE — Progress Notes (Signed)
SATURATION QUALIFICATIONS: (This note is used to comply with regulatory documentation for home oxygen)  Patient Saturations on Room Air at Rest =94%  Patient Saturations on Room Air while Ambulating =90%  Patient Saturations on  Liters of oxygen while Ambulating = %did not test  As sats were > 88% on RA  Please briefly explain why patient needs home oxygen:does not require based on  Test today. Blanchard Kelch PT [phone] 04/02/15

## 2015-04-02 NOTE — Evaluation (Signed)
Physical Therapy Evaluation Patient Details Name: Scott Berry MRN: [id number] DOB: 22-Dec-1960 Today's Date: 04/02/2015   History of Present Illness  Scott Berry is a 54 y.o. male with a past medical history significant for CAD, colon perforation status post colectomy, hypertension, and hyperlipidemia who presents with 3 days worsening fever, shortness of breath, and productive cough   Clinical Impression  Patient ambulates well, no balance  Problems. Ambulated on RA x 400' with sats >90%. HR 102. No further PT needs at this time.    Follow Up Recommendations No PT follow up    Equipment Recommendations  None recommended by PT    Recommendations for Other Services       Precautions / Restrictions Precautions Precautions: None      Mobility  Bed Mobility Overal bed mobility: Independent                Transfers Overall transfer level: Independent                  Ambulation/Gait Ambulation/Gait assistance: Independent Ambulation Distance (Feet): 400 Feet       Gait velocity interpretation: at or above normal speed for age/gender General Gait Details: pushed IV pole but also  able to ambulate without  Stairs            Wheelchair Mobility    Modified Rankin (Stroke Patients Only)       Balance Overall balance assessment: Independent                                           Pertinent Vitals/Pain Pain Assessment: 0-10 Pain Score: 5  Pain Location: chest Pain Descriptors / Indicators: Pressure Pain Intervention(s): Limited activity within patient's tolerance    Home Living Family/patient expects to be discharged to:: Shelter/Homeless                      Prior Function Level of Independence: Independent               Hand Dominance        Extremity/Trunk Assessment   Upper Extremity Assessment: Overall WFL for tasks assessed           Lower Extremity Assessment: Overall WFL for tasks  assessed      Cervical / Trunk Assessment: Normal  Communication   Communication: No difficulties  Cognition Arousal/Alertness: Awake/alert Behavior During Therapy: WFL for tasks assessed/performed Overall Cognitive Status: Within Functional Limits for tasks assessed                      General Comments      Exercises        Assessment/Plan    PT Assessment Patent does not need any further PT services  PT Diagnosis     PT Problem List    PT Treatment Interventions     PT Goals (Current goals can be found in the Care Plan section) Acute Rehab PT Goals PT Goal Formulation: All assessment and education complete, DC therapy    Frequency     Barriers to discharge        Co-evaluation               End of Session   Activity Tolerance: Patient tolerated treatment well Patient left: in bed;with call bell/phone within reach Nurse Communication: Mobility status  Time: 1610-9604 PT Time Calculation (min) (ACUTE ONLY): 20 min   Charges:   PT Evaluation $Initial PT Evaluation Tier I: 1 Procedure     PT G CodesRada Hay 04/02/2015, 9:14 AM Blanchard Kelch PT [phone]

## 2015-04-03 LAB — CBC
HEMATOCRIT: 36.9 % — AB (ref 39.0–52.0)
Hemoglobin: 12.6 g/dL — ABNORMAL LOW (ref 13.0–17.0)
MCH: 29.4 pg (ref 26.0–34.0)
MCHC: 34.1 g/dL (ref 30.0–36.0)
MCV: 86.2 fL (ref 78.0–100.0)
Platelets: 411 10*3/uL — ABNORMAL HIGH (ref 150–400)
RBC: 4.28 MIL/uL (ref 4.22–5.81)
RDW: 14.4 % (ref 11.5–15.5)
WBC: 10.6 10*3/uL — ABNORMAL HIGH (ref 4.0–10.5)

## 2015-04-03 LAB — URINE CULTURE: Culture: NO GROWTH

## 2015-04-03 MED ORDER — LEVOFLOXACIN 750 MG PO TABS: 750.0000 mg | ORAL_TABLET | Freq: Every day | ORAL | Status: DC

## 2015-04-03 MED ORDER — GUAIFENESIN-DM 100-10 MG/5ML PO SYRP: 5.0000 mL | ORAL_SOLUTION | ORAL | Status: DC | PRN

## 2015-04-03 MED ORDER — ADULT MULTIVITAMIN W/MINERALS CH: 1.0000 | ORAL_TABLET | Freq: Every day | ORAL | Status: DC

## 2015-04-03 MED ORDER — HYDROCODONE-ACETAMINOPHEN 5-325 MG PO TABS: 1.0000 | ORAL_TABLET | Freq: Four times a day (QID) | ORAL | Status: DC | PRN

## 2015-04-03 MED ORDER — NITROGLYCERIN 0.4 MG SL SUBL: 0.4000 mg | SUBLINGUAL_TABLET | SUBLINGUAL | Status: DC | PRN
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
## 2015-04-03 NOTE — Progress Notes (Signed)
CSW met with pt to assist with d/c planning. Pt will return to [facility] when d/c. MD has provided letter requesting pt be allowed to stay at the shelter during the day. CSW has provided bus passes.   Werner Lean LCSW [phone]

## 2015-04-05 ENCOUNTER — Ambulatory Visit: Payer: Self-pay | Admitting: Family Medicine

## 2015-04-06 LAB — CULTURE, BLOOD (ROUTINE X 2)
Culture: NO GROWTH
Culture: NO GROWTH

## 2015-05-09 ENCOUNTER — Other Ambulatory Visit: Payer: Self-pay

## 2015-05-09 ENCOUNTER — Telehealth: Payer: Self-pay | Admitting: Cardiology

## 2015-05-09 ENCOUNTER — Telehealth: Payer: Self-pay

## 2015-05-09 ENCOUNTER — Other Ambulatory Visit: Payer: Self-pay | Admitting: Internal Medicine

## 2015-05-09 MED ORDER — ISOSORBIDE MONONITRATE ER 60 MG PO TB24: 30.0000 mg | ORAL_TABLET | Freq: Two times a day (BID) | ORAL | Status: DC

## 2015-05-09 NOTE — Telephone Encounter (Signed)
New Message  Pt requested to speak w/ Rn concerning medication- pt would not specify. Please call back and discuss.

## 2015-05-09 NOTE — Telephone Encounter (Signed)
Pt has been getting his medications refilled for some time now [facility] when he is not in the hospital.  Spoke with pt - refill sent into pharmacy as ordered by Dr Anne FuSkains for Isosorbide 60 mg 1/2 tab BID.  Pt has a follow up appt scheduled for 06/02/15 as instructed by Dr Anne FuSkains at last office visit.

## 2015-05-09 NOTE — Telephone Encounter (Signed)
Spoke with pt who was having trouble getting his Isosorbide refilled.  Pt has an appt with Dr Anne FuSkains for f/u in 06/2015 as instructed.  He is aware I will refill the medication as ordered.

## 2015-05-09 NOTE — Telephone Encounter (Signed)
Please review patient's chart for correct dosage of medication.  His Rx states Isosorbide 60mg  ( to take 0.5 pill twice daily ).  We have no record of refill since 4/16.  Patient states that he had his Rx [facility] and on the label it says ( take 1 tablet twice daily.)  We have no record of the refill in our records.

## 2015-06-02 ENCOUNTER — Ambulatory Visit (INDEPENDENT_AMBULATORY_CARE_PROVIDER_SITE_OTHER): Payer: Self-pay | Admitting: Cardiology

## 2015-06-02 ENCOUNTER — Encounter: Payer: Self-pay | Admitting: Cardiology

## 2015-06-02 VITALS — BP 120/82 | HR 92 | Ht 69.0 in | Wt 129.0 lb

## 2015-06-02 DIAGNOSIS — Z720 Tobacco use: Secondary | ICD-10-CM

## 2015-06-02 DIAGNOSIS — I2510 Atherosclerotic heart disease of native coronary artery without angina pectoris: Secondary | ICD-10-CM

## 2015-06-02 DIAGNOSIS — I208 Other forms of angina pectoris: Secondary | ICD-10-CM

## 2015-06-02 DIAGNOSIS — F1910 Other psychoactive substance abuse, uncomplicated: Secondary | ICD-10-CM

## 2015-06-02 MED ORDER — ISOSORBIDE MONONITRATE ER 30 MG PO TB24: 30.0000 mg | ORAL_TABLET | Freq: Every day | ORAL | Status: DC

## 2015-06-02 MED ORDER — ATORVASTATIN CALCIUM 40 MG PO TABS: 40.0000 mg | ORAL_TABLET | Freq: Every day | ORAL | Status: DC

## 2015-06-02 MED ORDER — ASPIRIN EC 81 MG PO TBEC: 81.0000 mg | DELAYED_RELEASE_TABLET | Freq: Every day | ORAL | Status: DC

## 2015-06-02 MED ORDER — METOPROLOL TARTRATE 25 MG PO TABS: 25.0000 mg | ORAL_TABLET | Freq: Two times a day (BID) | ORAL | Status: DC

## 2015-06-02 NOTE — Patient Instructions (Addendum)
Medication Instructions: Please restart ASA 81 mg a day. Restart Metoprolol 25 mg one twice a day. Restart atorvastatin 40 mg once a day. Continue all other medications as listed.  Follow-Up: Follow up in 6 months with Dr. Anne FuSkains.  You will receive a letter in the mail 2 months before you are due.  Please call us when you receive this letter to schedule your follow up appointment.  If you need a refill on your cardiac medications before your next appointment, please call your pharmacy.  Thank you for choosing [facility]!!

## 2015-06-02 NOTE — Progress Notes (Signed)
[address] [place], [place]  [address] Phone: [phone] Fax:  [phone]  Date:  06/02/2015   ID:  Scott Berry, DOB 10-30-1960, MRN [id number]  PCP:  No PCP Per Patient   History of Present Illness: Scott Berry is a 54 y.o. male with history of hyperlipidemia, prior drug use, hypertension with hospitalization November 2015 after complaining of episodic chest pain off and on. In the ED emergency room he was noted to have nonsustained VT. Point-of-care troponin was mildly elevated at 0.31. Troponin subsequently peaked at 8.4. Echocardiogram showed ejection fraction of 55%. Cardiac catheterization was performed on 05/05/14 by Dr. Excell Seltzerooper which revealed a total occlusion of RCA, moderate mid LAD stenosis and patent left circumflex with mild nonobstructive disease. Plan currently is to maximize medical therapy with consideration of possible intervention after maximal medical therapy.  He was hospitalized in Oct 2016 for CAP.  He reports completing abx and symptoms have since resolved.   He never filled rx for metoprolol and atorvastatin prescribed last visit. He picked up ASA but never refilled.  He said he was unable to pick up medications due to cost.  He feels he gets most benefit from Imdur so he has continued to fill this med.  He denies CP, palpitations, lower extremity edema, dyspnea or syncope.  He does report calf pain with exertion (about 1 block).  Stopping to rest relieves the pain.    Wt Readings from Last 3 Encounters:  04/01/15 130 lb (58.968 kg)  03/09/15 140 lb (63.504 kg)  11/01/14 136 lb (61.689 kg)     Past Medical History  Diagnosis Date  . Hypertension   . Sleep apnea 1995    not a problem now  . Shortness of breath     if don't take medicines  . 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 Polysubstance abuse in past 08/15/2011  . Personal history of colonic polyps 08/15/2011  . Tobacco abuse 08/15/2011   . Pure hypercholesterolemia   . Coronary artery arteriosclerosis 2011    cath with long tubular heavily calcified mid LAD 60%, calcified RCA with multiple 50% stenosis, ostial left circ stenosis of 30% and calcified LM with no stenosis on medical management    Past Surgical History  Procedure Laterality Date  . Foreign body removal rectal  Oct 2011  . Colon surgery  14Jun2012    colectomy/diverting ileostomy - colon perf  . Ileo loop diversion  06/21/2011    Procedure: Takedown of loop ileostomy  . Loop ileostomy takedown  20Dec2012    Procedure: Takedown of loop ileostomy  . Cardiac catheterization  09/10/2009    Results in chart  . Left heart catheterization with coronary angiogram N/A 05/05/2014    Procedure: LEFT HEART CATHETERIZATION WITH CORONARY ANGIOGRAM;  Surgeon: Micheline ChapmanMichael D Cooper, MD;  [facility];  Service: Cardiovascular;  Laterality: N/A;  . Percutaneous coronary stent intervention (pci-s) Right 05/05/2014    Procedure: PERCUTANEOUS CORONARY STENT INTERVENTION (PCI-S);  Surgeon: Micheline ChapmanMichael D Cooper, MD;  [facility];  Service: Cardiovascular;  Laterality: Right;  Prox RCA    Current Outpatient Prescriptions  Medication Sig Dispense Refill  . guaiFENesin-dextromethorphan (ROBITUSSIN DM) 100-10 MG/5ML syrup Take 5 mLs by mouth every 4 (four) hours as needed for cough. 118 mL 0  . HYDROcodone-acetaminophen (NORCO/VICODIN) 5-325 MG tablet Take 1 tablet by mouth every 6 (six) hours as needed for moderate pain.  30 tablet 0  . isosorbide mononitrate (IMDUR) 60 MG 24 hr tablet Take 0.5 tablets (30 mg total) by mouth 2 (two) times daily. 30 tablet 1  . levofloxacin (LEVAQUIN) 750 MG tablet Take 1 tablet (750 mg total) by mouth daily. 4 tablet 0  . Multiple Vitamin (MULTIVITAMIN WITH MINERALS) TABS tablet Take 1 tablet by mouth daily. 30 tablet 0  . nitroGLYCERIN (NITROSTAT) 0.4 MG SL tablet Place 1 tablet (0.4 mg total) under the tongue every 5 (five) minutes as  needed for chest pain. 25 tablet 0   No current facility-administered medications for this visit.    Allergies:    Allergies  Allergen Reactions  . Penicillins Anaphylaxis    Has patient had a PCN reaction causing immediate rash, facial/tongue/throat swelling, SOB or lightheadedness with hypotension: Yes Has patient had a PCN reaction causing severe rash involving mucus membranes or skin necrosis:No Has patient had a PCN reaction that required hospitalization No Has patient had a PCN reaction occurring within the last 10 years: No If all of the above answers are "NO", then may proceed with Cephalosporin use.     Social History:  The patient  reports that he has been smoking Cigarettes.  He has been smoking about 0.50 packs per day. He has never used smokeless tobacco. He reports that he drinks about 1.8 oz of alcohol per week. He reports that he does not use illicit drugs.   Family History  Problem Relation Age of Onset  . Stroke Father   . Pancreatic disease Father   . Stroke Mother     ROS:  Please see the history of present illness.   Denies chest pain, palpitations, LE edema, syncope.  Reports LE intermittent claudication after 1 block for the past few months.  All other systems reviewed and negative.   PHYSICAL EXAM: VS:  BP 120/82 mmHg  Pulse 92  Ht  (1.753 m)  Wt 129 lb (58.514 kg)  BMI 19.04 kg/m2 Well nourished, well developed, in no acute distress HEENT: normal, /AT, EOMI Neck: no JVD, normal carotid upstroke, no bruit Cardiac:  normal S1, S2; RRR; no murmur Lungs:  clear to auscultation bilaterally, no wheezing, rhonchi or rales Abd: soft, nontender, no hepatomegaly, no bruits Ext: no edema, 2+ distal pulses Skin: warm and dry GU: deferred Neuro: no focal abnormalities noted, AAO x 3  EKG:   06/02/14-sinus rhythm, 67, left axis deviation. No ST segment changes. T-wave inversion noted in 3, aVF. Prior 04/26/14-heart rate 88 bpmSinus rhythm, PVC, vertical  axis, right atrial enlargement    peaked T waves.        ASSESSMENT AND PLAN:  54 year old with occluded RCA, non-ST elevation myocardial infarction in beginning of November 2015, here for follow-up.  1. Coronary artery disease-he has not been able to obtain his medications recently. He is here for refills today.  Aspirin 81 mg, metoprolol at lower dose of 25 mg twice a day and atorvastatin 40 mg once a day were all restarted at the last visit but the patient never picked them up.  Explained the benefits of these medications including future heart attack reduction risk. He feels well normally with his isosorbide and this is the one medicine he had today.  Will refill his nitroglycerin when necessary.  Medical management for now. Since he is feeling well, no indication at this point for CTO procedure of RCA.  2. Angina-as above. Doing well. He has not had recurrence of chest pain since his last  visit.  3. Non-ST elevation myocardial infarction-improved symptomatically. No syncope. He is currently unemployed but is trying to find work with the help of [employer].  Need to resume aspirin, Beta blocker and Statin.  4. Tobacco use- Still smoking.  He is smoking about 1 PPD.  Encouraged cessation.    6 month follow-up.   Signed, Donato Schultz, MD [facility]  06/02/2015 8:37 AM

## 2015-07-06 ENCOUNTER — Telehealth: Payer: Self-pay | Admitting: *Deleted

## 2015-07-06 MED FILL — ISOSORBIDE MN ER 60 MG TAB: 60 | 30 days supply | Qty: 30 | Fill #1

## 2015-07-06 NOTE — Telephone Encounter (Signed)
pt wanted to know why he only got a month supply on his imdur, i offereed to call [facility], pt called in old RX according to them, they will call and clear up the misunderstanding with the pt

## 2015-08-03 ENCOUNTER — Other Ambulatory Visit: Payer: Self-pay | Admitting: *Deleted

## 2015-08-03 MED ORDER — ISOSORBIDE MONONITRATE ER 30 MG PO TB24: 30.0000 mg | ORAL_TABLET | Freq: Every day | ORAL | Status: DC

## 2015-08-03 MED FILL — ISOSORBIDE MN ER 60 MG TAB: 60 | 30 days supply | Qty: 30 | Fill #0

## 2015-08-03 NOTE — Telephone Encounter (Signed)
Patient called and stated that he will only get #30 at a time as he is paying out of pocket for his medications. He stated that when he picked the isosorbide up last month his bottle indicated that there were zero refills remaining and he has been without it for two weeks. I will send him in a new rx.

## 2015-09-05 MED FILL — ISOSORBIDE MN ER 60 MG TAB: 60 | 30 days supply | Qty: 30 | Fill #1

## 2015-09-09 ENCOUNTER — Ambulatory Visit: Payer: Self-pay | Attending: Internal Medicine

## 2015-09-27 MED FILL — ISOSORBIDE MN ER 30 MG TAB: 30 | 30 days supply | Qty: 60 | Fill #0

## 2015-10-18 MED FILL — ISOSORBIDE MN ER 30 MG TAB: 30 | 30 days supply | Qty: 30 | Fill #0

## 2015-10-19 ENCOUNTER — Other Ambulatory Visit: Payer: Self-pay | Admitting: Cardiology

## 2015-11-01 MED FILL — ISOSORBIDE MN ER 30 MG TAB: 30 | 30 days supply | Qty: 30 | Fill #1

## 2015-12-02 MED FILL — ISOSORBIDE MN ER 30 MG TAB: 30 | 30 days supply | Qty: 30 | Fill #2

## 2015-12-28 MED FILL — ISOSORBIDE MN ER 30 MG TAB: 30 | 30 days supply | Qty: 30 | Fill #3

## 2016-02-21 MED FILL — ISOSORBIDE MN ER 30 MG TAB: 30 | 30 days supply | Qty: 30 | Fill #4

## 2016-03-16 MED FILL — ISOSORBIDE MN ER 30 MG TAB: 30 | 30 days supply | Qty: 30 | Fill #5

## 2016-04-17 MED FILL — ISOSORBIDE MN ER 30 MG TAB: 30 | 30 days supply | Qty: 30 | Fill #6

## 2016-05-16 MED FILL — ISOSORBIDE MN ER 30 MG TAB: 30 | 30 days supply | Qty: 30 | Fill #7

## 2016-06-19 MED FILL — ISOSORBIDE MN ER 30 MG TAB: 30 | 30 days supply | Qty: 30 | Fill #8

## 2016-07-17 MED FILL — ?ISOSORBIDE MN ER 30 MG TAB: 30 | 30 days supply | Qty: 30 | Fill #9

## 2016-08-16 ENCOUNTER — Other Ambulatory Visit: Payer: Self-pay | Admitting: Cardiology

## 2016-08-16 MED FILL — ISOSORBIDE MN ER 30 MG TAB: 30 | 30 days supply | Qty: 30 | Fill #0

## 2016-09-17 ENCOUNTER — Other Ambulatory Visit: Payer: Self-pay | Admitting: Cardiology

## 2016-09-17 MED FILL — ISOSORBIDE MN ER 30 MG TAB: 30 | 15 days supply | Qty: 15 | Fill #0

## 2016-10-02 ENCOUNTER — Other Ambulatory Visit: Payer: Self-pay | Admitting: Cardiology

## 2016-10-03 ENCOUNTER — Other Ambulatory Visit: Payer: Self-pay | Admitting: Cardiology

## 2016-10-03 ENCOUNTER — Telehealth: Payer: Self-pay | Admitting: Cardiology

## 2016-10-03 MED FILL — ISOSORBIDE MN ER 30 MG TAB: 30 | 15 days supply | Qty: 15 | Fill #0

## 2016-10-03 NOTE — Telephone Encounter (Signed)
New Message    Patient out , he has appt on 4/9  *STAT* If patient is at the pharmacy, call can be transferred to refill team.   1. Which medications need to be refilled? (please list name of each medication and dose if known) isosorbide  2. Which pharmacy/location (including street and city if local pharmacy) is [facility]   3. Do they need a 30 day or 90 day supply? 30

## 2016-10-03 NOTE — Telephone Encounter (Signed)
Patient is at [facility] and states that the pharmacy needs verification of his appt on 10-08-16. Thanks.

## 2016-10-05 ENCOUNTER — Encounter: Payer: Self-pay | Admitting: Cardiology

## 2016-10-08 ENCOUNTER — Ambulatory Visit: Payer: Self-pay | Admitting: Cardiology

## 2016-10-09 ENCOUNTER — Encounter: Payer: Self-pay | Admitting: *Deleted

## 2016-10-22 ENCOUNTER — Other Ambulatory Visit: Payer: Self-pay | Admitting: Cardiology

## 2016-10-22 MED ORDER — ISOSORBIDE MONONITRATE ER 30 MG PO TB24: ORAL_TABLET | ORAL | 0 refills | Status: DC

## 2016-10-22 MED FILL — ISOSORBIDE MN ER 30 MG TAB: 30 | 4 days supply | Qty: 4 | Fill #0

## 2016-10-22 NOTE — Telephone Encounter (Signed)
°*  STAT* If patient is at the pharmacy, call can be transferred to refill team.   1. Which medications need to be refilled? (please list name of each medication and dose if known) Isosorbide need today asap-pt has appt for 10-26-16-pt in the area right now   2. Which pharmacy/location (including street and city if local pharmacy) [facility]  3. Do they need a 30 day or 90 day supply? 30 and refills

## 2016-10-26 ENCOUNTER — Encounter: Payer: Self-pay | Admitting: Cardiology

## 2016-10-26 ENCOUNTER — Ambulatory Visit (INDEPENDENT_AMBULATORY_CARE_PROVIDER_SITE_OTHER): Payer: Self-pay | Admitting: Cardiology

## 2016-10-26 VITALS — BP 146/82 | HR 81 | Ht 66.0 in | Wt 126.0 lb

## 2016-10-26 DIAGNOSIS — I2510 Atherosclerotic heart disease of native coronary artery without angina pectoris: Secondary | ICD-10-CM

## 2016-10-26 DIAGNOSIS — I208 Other forms of angina pectoris: Secondary | ICD-10-CM

## 2016-10-26 DIAGNOSIS — Z720 Tobacco use: Secondary | ICD-10-CM

## 2016-10-26 DIAGNOSIS — I252 Old myocardial infarction: Secondary | ICD-10-CM

## 2016-10-26 MED ORDER — ISOSORBIDE MONONITRATE ER 30 MG PO TB24: ORAL_TABLET | ORAL | 11 refills | Status: DC

## 2016-10-26 NOTE — Patient Instructions (Signed)
Continue same medications.   Your physician wants you to follow-up in: 1 year.  You will receive a reminder letter in the mail two months in advance. If you don't receive a letter, please call our office to schedule the follow-up appointment.  

## 2016-10-26 NOTE — Progress Notes (Addendum)
[address] [place], [place]  [address] Phone: [phone] Fax:  [phone]  Date:  10/26/2016   ID:  Scott Berry, DOB 1960-07-25, MRN [id number]  PCP:  No PCP Per Patient   History of Present Illness: Scott Berry is a 56 y.o. male with history of hyperlipidemia, prior drug use, hypertension with hospitalization November 2015 after complaining of episodic chest pain off and on. In the ED emergency room he was noted to have nonsustained VT. Point-of-care troponin was mildly elevated at 0.31. Troponin subsequently peaked at 8.4. Echocardiogram showed ejection fraction of 55%. Cardiac catheterization was performed on 05/05/14 by Dr. Excell Seltzer which revealed a total occlusion of RCA, moderate mid LAD stenosis and patent left circumflex with mild nonobstructive disease. Plan currently is to maximize medical therapy with consideration of possible intervention after maximal medical therapy.  He was hospitalized in Oct 2016 for CAP.  He reports completing abx and symptoms have since resolved.   He never filled rx for metoprolol and atorvastatin prescribed last visit. He picked up ASA but never refilled.  He said he was unable to pick up medications due to cost.  He feels he gets most benefit from Imdur so he has continued to fill this med.  10/26/16-he still having trouble with medication compliance. 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 Denies chest pain, shortness of breath, fevers, chills, syncope.  Wt Readings from Last 3 Encounters:  10/26/16 126 lb (57.2 kg)  06/02/15 129 lb (58.5 kg)  04/01/15 130 lb (59 kg)     Past Medical History:  Diagnosis Date  . Coronary artery arteriosclerosis 2011   cath with long tubular heavily calcified mid LAD 60%, calcified RCA with multiple 50% stenosis, ostial left circ stenosis of 30% and calcified LM with no stenosis on medical management  . Hypertension   . Perforation of colon - s/p Sigmoid colectomy /  diverting ileostomy 01/30/2011  . Personal history of colonic polyps 08/15/2011  . Polysubstance abuse in past 08/15/2011  . Pure hypercholesterolemia   . Shortness of breath    if don't take medicines  . Sleep apnea 1995   not a problem now  . Tobacco abuse 08/15/2011    Past Surgical History:  Procedure Laterality Date  . CARDIAC CATHETERIZATION  09/10/2009   Results in chart  . COLON SURGERY  14Jun2012   colectomy/diverting ileostomy - colon perf  . FOREIGN BODY REMOVAL RECTAL  Oct 2011  . ILEO LOOP DIVERSION  06/21/2011   Procedure: Takedown of loop ileostomy  . LEFT HEART CATHETERIZATION WITH CORONARY ANGIOGRAM N/A 05/05/2014   Procedure: LEFT HEART CATHETERIZATION WITH CORONARY ANGIOGRAM;  Surgeon: Micheline Chapman, MD;  Location: [facility] CATH LAB;  Service: Cardiovascular;  Laterality: N/A;  . Loop ileostomy takedown  20Dec2012   Procedure: Takedown of loop ileostomy  . PERCUTANEOUS CORONARY STENT INTERVENTION (PCI-S) Right 05/05/2014   Procedure: PERCUTANEOUS CORONARY STENT INTERVENTION (PCI-S);  Surgeon: Micheline Chapman, MD;  Location: [facility] CATH LAB;  Service: Cardiovascular;  Laterality: Right;  Prox RCA    Current Outpatient Prescriptions  Medication Sig Dispense Refill  . aspirin EC 81 MG tablet Take 1 tablet (81 mg total) by mouth daily. 90 tablet 3  . isosorbide mononitrate (IMDUR) 30 MG 24 hr tablet TAKE 1 TABLET BY MOUTH DAILY. MUST KEEP 10/26/16 APPT FOR MORE MEDICATION REFILLS 4 tablet 0   No current facility-administered medications for this visit.  Allergies:    Allergies  Allergen Reactions  . Penicillins Anaphylaxis    Has patient had a PCN reaction causing immediate rash, facial/tongue/throat swelling, SOB or lightheadedness with hypotension: Yes Has patient had a PCN reaction causing severe rash involving mucus membranes or skin necrosis:No Has patient had a PCN reaction that required hospitalization No Has patient had a PCN reaction occurring within the last  10 years: No If all of the above answers are "NO", then may proceed with Cephalosporin use.     Social History:  The patient  reports that he has been smoking Cigarettes.  He has been smoking about 0.50 packs per day. He has never used smokeless tobacco. He reports that he drinks about 1.8 oz of alcohol per week . He reports that he does not use drugs.   Family History  Problem Relation Age of Onset  . Stroke Father   . Pancreatic disease Father   . Stroke Mother     ROS:  Unless specified above, all other review of systems negative.   PHYSICAL EXAM: VS:  BP (!) 146/82   Pulse 81   Ht  (1.676 m)   Wt 126 lb (57.2 kg)   SpO2 96%   BMI 20.34 kg/m  GEN: Thin, in no acute distress  HEENT: normal  Neck: no JVD, carotid bruits, or masses Cardiac: RRR; no murmurs, rubs, or gallops,no edema  Respiratory:  clear to auscultation bilaterally, normal work of breathing GI: soft, nontender, nondistended, + BS MS: no deformity or atrophy  Skin: warm and dry, no rash Neuro:  Alert and Oriented x 3, Strength and sensation are intact Psych: euthymic mood, full affect   EKG:   EKG today 10/26/16 shows sinus rhythm 85 no other abnormalities, slight peaking of T waves in V3. 06/02/14-sinus rhythm, 67, left axis deviation. No ST segment changes. T-wave inversion noted in 3, aVF. Prior 04/26/14-heart rate 88 bpmSinus rhythm, PVC, vertical axis, right atrial enlargement    peaked T waves.        ASSESSMENT AND PLAN:  56 year old with occluded RCA, non-ST elevation myocardial infarction in beginning of November 2015, here for follow-up.  Coronary artery disease  - No anginal symptoms. Overall doing well.  - Encouraged use of aspirin at least.  - We have refilled his medications. Compliance has been issue.  - No indication for CTO procedure of RCA  Angina  - Improved. Doing well. Walking quite a bit.  History of ST Elevation Myocardial Infarction  - Doing well.  - Encouraging medication  use.  Tobacco use  - Encouraged cessation.  Signed, Donato Schultz, MD [facility]  10/26/2016 1:38 PM

## 2016-10-29 MED FILL — ISOSORBIDE MN ER 30 MG TAB: 30 | 30 days supply | Qty: 30 | Fill #0

## 2016-11-20 MED FILL — ISOSORBIDE MN ER 30 MG TAB: 30 | 30 days supply | Qty: 30 | Fill #1

## 2016-12-18 MED FILL — ISOSORBIDE MN ER 30 MG TAB: 30 | 30 days supply | Qty: 30 | Fill #2

## 2017-01-14 MED FILL — ISOSORBIDE MN ER 30 MG TAB: 30 | 30 days supply | Qty: 30 | Fill #3

## 2017-02-11 MED FILL — ISOSORBIDE MN ER 30 MG TAB: 30 | 30 days supply | Qty: 30 | Fill #4

## 2017-11-10 ENCOUNTER — Emergency Department (HOSPITAL_COMMUNITY): Payer: Self-pay | Admitting: Anesthesiology

## 2017-11-10 ENCOUNTER — Encounter (HOSPITAL_COMMUNITY)
Admission: EM | Disposition: A | Discharge status: Home / Self Care | Payer: Self-pay | Source: Home / Self Care | Attending: Urology

## 2017-11-10 ENCOUNTER — Emergency Department (HOSPITAL_COMMUNITY): Payer: Self-pay

## 2017-11-10 ENCOUNTER — Observation Stay (HOSPITAL_COMMUNITY)
Admission: EM | Admit: 2017-11-10 | Discharge: 2017-11-11 | Disposition: A | Discharge status: Home / Self Care | Payer: Self-pay | Attending: Urology | Admitting: Urology

## 2017-11-10 ENCOUNTER — Other Ambulatory Visit: Payer: Self-pay

## 2017-11-10 ENCOUNTER — Encounter (HOSPITAL_COMMUNITY): Payer: Self-pay | Admitting: *Deleted

## 2017-11-10 DIAGNOSIS — I252 Old myocardial infarction: Secondary | ICD-10-CM | POA: Insufficient documentation

## 2017-11-10 DIAGNOSIS — Z87892 Personal history of anaphylaxis: Secondary | ICD-10-CM | POA: Insufficient documentation

## 2017-11-10 DIAGNOSIS — T199XXA Foreign body in genitourinary tract, part unspecified, initial encounter: Secondary | ICD-10-CM | POA: Diagnosis present

## 2017-11-10 DIAGNOSIS — I10 Essential (primary) hypertension: Secondary | ICD-10-CM | POA: Insufficient documentation

## 2017-11-10 DIAGNOSIS — Z9049 Acquired absence of other specified parts of digestive tract: Secondary | ICD-10-CM | POA: Insufficient documentation

## 2017-11-10 DIAGNOSIS — F17210 Nicotine dependence, cigarettes, uncomplicated: Secondary | ICD-10-CM | POA: Insufficient documentation

## 2017-11-10 DIAGNOSIS — I2510 Atherosclerotic heart disease of native coronary artery without angina pectoris: Secondary | ICD-10-CM | POA: Insufficient documentation

## 2017-11-10 DIAGNOSIS — G4730 Sleep apnea, unspecified: Secondary | ICD-10-CM | POA: Insufficient documentation

## 2017-11-10 DIAGNOSIS — Z79899 Other long term (current) drug therapy: Secondary | ICD-10-CM | POA: Insufficient documentation

## 2017-11-10 DIAGNOSIS — Z955 Presence of coronary angioplasty implant and graft: Secondary | ICD-10-CM | POA: Insufficient documentation

## 2017-11-10 DIAGNOSIS — Z7982 Long term (current) use of aspirin: Secondary | ICD-10-CM | POA: Insufficient documentation

## 2017-11-10 DIAGNOSIS — X58XXXA Exposure to other specified factors, initial encounter: Secondary | ICD-10-CM | POA: Insufficient documentation

## 2017-11-10 DIAGNOSIS — Z880 Allergy status to penicillin: Secondary | ICD-10-CM | POA: Insufficient documentation

## 2017-11-10 DIAGNOSIS — Z823 Family history of stroke: Secondary | ICD-10-CM | POA: Insufficient documentation

## 2017-11-10 DIAGNOSIS — T190XXA Foreign body in urethra, initial encounter: Principal | ICD-10-CM | POA: Insufficient documentation

## 2017-11-10 HISTORY — PX: CYSTOSCOPY: SHX5120

## 2017-11-10 HISTORY — PX: LAPAROTOMY: SHX154

## 2017-11-10 LAB — CBC WITH DIFFERENTIAL/PLATELET
Basophils Absolute: 0 10*3/uL (ref 0.0–0.1)
Basophils Relative: 0 %
EOS PCT: 0 %
Eosinophils Absolute: 0 10*3/uL (ref 0.0–0.7)
HCT: 42.8 % (ref 39.0–52.0)
Hemoglobin: 14.6 g/dL (ref 13.0–17.0)
LYMPHS ABS: 0.5 10*3/uL — AB (ref 0.7–4.0)
LYMPHS PCT: 5 %
MCH: 30.2 pg (ref 26.0–34.0)
MCHC: 34.1 g/dL (ref 30.0–36.0)
MCV: 88.4 fL (ref 78.0–100.0)
MONOS PCT: 10 %
Monocytes Absolute: 1.1 10*3/uL — ABNORMAL HIGH (ref 0.1–1.0)
Neutro Abs: 8.8 10*3/uL — ABNORMAL HIGH (ref 1.7–7.7)
Neutrophils Relative %: 85 %
PLATELETS: 269 10*3/uL (ref 150–400)
RBC: 4.84 MIL/uL (ref 4.22–5.81)
RDW: 15.5 % (ref 11.5–15.5)
WBC: 10.4 10*3/uL (ref 4.0–10.5)

## 2017-11-10 LAB — BASIC METABOLIC PANEL
Anion gap: 11 (ref 5–15)
BUN: 13 mg/dL (ref 6–20)
CHLORIDE: 100 mmol/L — AB (ref 101–111)
CO2: 27 mmol/L (ref 22–32)
CREATININE: 1.04 mg/dL (ref 0.61–1.24)
Calcium: 9.6 mg/dL (ref 8.9–10.3)
GFR calc Af Amer: >60 mL/min (ref >60)
GFR calc non Af Amer: >60 mL/min (ref >60)
GLUCOSE: 123 mg/dL — AB (ref 65–99)
POTASSIUM: 3.9 mmol/L (ref 3.5–5.1)
Sodium: 138 mmol/L (ref 135–145)

## 2017-11-10 LAB — URINALYSIS, ROUTINE W REFLEX MICROSCOPIC
Bacteria, UA: NONE SEEN
Bilirubin Urine: NEGATIVE
Glucose, UA: NEGATIVE mg/dL
Ketones, ur: 5 mg/dL — AB
NITRITE: POSITIVE — AB
PROTEIN: 30 mg/dL — AB
RBC / HPF: >50 RBC/hpf — ABNORMAL HIGH (ref 0–5)
Specific Gravity, Urine: 1.016 (ref 1.005–1.030)
WBC, UA: >50 WBC/hpf — ABNORMAL HIGH (ref 0–5)
pH: 6 (ref 5.0–8.0)

## 2017-11-10 LAB — RAPID URINE DRUG SCREEN, HOSP PERFORMED
AMPHETAMINES: NOT DETECTED
Barbiturates: NOT DETECTED
Benzodiazepines: NOT DETECTED
Cocaine: NOT DETECTED
OPIATES: NOT DETECTED
TETRAHYDROCANNABINOL: NOT DETECTED

## 2017-11-10 SURGERY — CYSTOSCOPY
Anesthesia: General

## 2017-11-10 MED ORDER — FENTANYL CITRATE (PF) 100 MCG/2ML IJ SOLN
INTRAMUSCULAR | Status: DC | PRN
  Administered 2017-11-10 (×2): 50 ug via INTRAVENOUS

## 2017-11-10 MED ORDER — MIDAZOLAM HCL 5 MG/5ML IJ SOLN
INTRAMUSCULAR | Status: DC | PRN
  Administered 2017-11-10 (×2): 1 mg via INTRAVENOUS

## 2017-11-10 MED ORDER — HYDROMORPHONE HCL 1 MG/ML IJ SOLN: 0.2500 mg | INTRAMUSCULAR | Status: DC | PRN

## 2017-11-10 MED ORDER — ONDANSETRON HCL 4 MG/2ML IJ SOLN
4.0000 mg | Freq: Once | INTRAMUSCULAR | Status: AC
  Administered 2017-11-10: 4 mg via INTRAVENOUS
  Filled 2017-11-10: qty 2

## 2017-11-10 MED ORDER — PROPOFOL 10 MG/ML IV BOLUS
INTRAVENOUS | Status: DC | PRN
  Administered 2017-11-10: 150 mg via INTRAVENOUS

## 2017-11-10 MED ORDER — TETANUS-DIPHTH-ACELL PERTUSSIS 5-2.5-18.5 LF-MCG/0.5 IM SUSP
0.5000 mL | Freq: Once | INTRAMUSCULAR | Status: AC
  Administered 2017-11-10: 0.5 mL via INTRAMUSCULAR
  Filled 2017-11-10: qty 0.5

## 2017-11-10 MED ORDER — ONDANSETRON HCL 4 MG/2ML IJ SOLN: 4.0000 mg | Freq: Once | INTRAMUSCULAR | Status: DC | PRN

## 2017-11-10 MED ORDER — MORPHINE SULFATE (PF) 4 MG/ML IV SOLN
4.0000 mg | Freq: Once | INTRAVENOUS | Status: AC
  Administered 2017-11-10: 4 mg via INTRAVENOUS
  Filled 2017-11-10: qty 1

## 2017-11-10 MED ORDER — LIDOCAINE 2% (20 MG/ML) 5 ML SYRINGE
INTRAMUSCULAR | Status: DC | PRN
  Administered 2017-11-10: 50 mg via INTRAVENOUS

## 2017-11-10 MED ORDER — LACTATED RINGERS IV SOLN
INTRAVENOUS | Status: DC | PRN
  Administered 2017-11-10: 22:00:00 via INTRAVENOUS

## 2017-11-10 MED ORDER — ROCURONIUM BROMIDE 10 MG/ML (PF) SYRINGE
PREFILLED_SYRINGE | INTRAVENOUS | Status: DC | PRN
  Administered 2017-11-10: 25 mg via INTRAVENOUS

## 2017-11-10 MED ORDER — SUCCINYLCHOLINE CHLORIDE 200 MG/10ML IV SOSY
PREFILLED_SYRINGE | INTRAVENOUS | Status: DC | PRN
  Administered 2017-11-10: 140 mg via INTRAVENOUS

## 2017-11-10 MED ORDER — MEPERIDINE HCL 50 MG/ML IJ SOLN: 6.2500 mg | INTRAMUSCULAR | Status: DC | PRN

## 2017-11-10 MED ORDER — CIPROFLOXACIN IN D5W 200 MG/100ML IV SOLN
200.0000 mg | Freq: Once | INTRAVENOUS | Status: AC
Start: 2017-11-10 — End: 2017-11-10
  Administered 2017-11-10: 200 mg via INTRAVENOUS
  Filled 2017-11-10 (×2): qty 100

## 2017-11-10 SURGICAL SUPPLY — 45 items
BAG URINE DRAINAGE (UROLOGICAL SUPPLIES) ×3 IMPLANT
BAG URINE LEG 500ML (DRAIN) IMPLANT
BAG URO CATCHER STRL LF (MISCELLANEOUS) ×3 IMPLANT
BLADE CLIPPER SURG (BLADE) ×3 IMPLANT
BLADE SURG 15 STRL LF DISP TIS (BLADE) IMPLANT
BLADE SURG 15 STRL SS (BLADE)
CATH FOLEY 2WAY SLVR  5CC 16FR (CATHETERS)
CATH FOLEY 2WAY SLVR  5CC 18FR (CATHETERS) ×2
CATH FOLEY 2WAY SLVR 30CC 24FR (CATHETERS) IMPLANT
CATH FOLEY 2WAY SLVR 5CC 16FR (CATHETERS) IMPLANT
CATH FOLEY 2WAY SLVR 5CC 18FR (CATHETERS) ×1 IMPLANT
CATH INTERMIT  6FR 70CM (CATHETERS) IMPLANT
CLOTH BEACON ORANGE TIMEOUT ST (SAFETY) ×3 IMPLANT
COVER FOOTSWITCH UNIV (MISCELLANEOUS) IMPLANT
COVER SURGICAL LIGHT HANDLE (MISCELLANEOUS) ×3 IMPLANT
DERMABOND ADVANCED (GAUZE/BANDAGES/DRESSINGS)
DERMABOND ADVANCED .7 DNX12 (GAUZE/BANDAGES/DRESSINGS) IMPLANT
DRAPE C-ARM 42X120 X-RAY (DRAPES) ×3 IMPLANT
DRAPE LAPAROTOMY T 102X78X121 (DRAPES) IMPLANT
ELECT PENCIL ROCKER SW 15FT (MISCELLANEOUS) IMPLANT
ELECT REM PT RETURN 15FT ADLT (MISCELLANEOUS) IMPLANT
GLOVE BIOGEL M STRL SZ7.5 (GLOVE) ×3 IMPLANT
GOWN STRL REUS W/TWL LRG LVL3 (GOWN DISPOSABLE) ×9 IMPLANT
GUIDEWIRE STR DUAL SENSOR (WIRE) IMPLANT
KIT BASIN OR (CUSTOM PROCEDURE TRAY) ×3 IMPLANT
MANIFOLD NEPTUNE II (INSTRUMENTS) ×3 IMPLANT
NEEDLE HYPO 22GX1.5 SAFETY (NEEDLE) IMPLANT
NS IRRIG 1000ML POUR BTL (IV SOLUTION) IMPLANT
PACK CYSTO (CUSTOM PROCEDURE TRAY) ×3 IMPLANT
PACK GENERAL/GYN (CUSTOM PROCEDURE TRAY) ×3 IMPLANT
PLUG CATH AND CAP STER (CATHETERS) IMPLANT
SPONGE DRAIN TRACH 4X4 STRL 2S (GAUZE/BANDAGES/DRESSINGS) IMPLANT
SUT MNCRL AB 4-0 PS2 18 (SUTURE) IMPLANT
SUT PDS AB 1 CTX 36 (SUTURE) IMPLANT
SUT SILK 2 0 30  PSL (SUTURE)
SUT SILK 2 0 30 PSL (SUTURE) IMPLANT
SUT VIC AB 2-0 SH 27 (SUTURE)
SUT VIC AB 2-0 SH 27X BRD (SUTURE) IMPLANT
SYR 30ML LL (SYRINGE) IMPLANT
SYR CONTROL 10ML LL (SYRINGE) IMPLANT
SYRINGE 60CC LL (MISCELLANEOUS) ×3 IMPLANT
TOWEL OR 17X26 10 PK STRL BLUE (TOWEL DISPOSABLE) ×3 IMPLANT
TUBING CONNECTING 10 (TUBING) ×2 IMPLANT
TUBING CONNECTING 10' (TUBING) ×1
WATER STERILE IRR 3000ML UROMA (IV SOLUTION) ×3 IMPLANT

## 2017-11-10 NOTE — ED Notes (Signed)
Pt bladder scanned. in bladder. Pt given urinal and urinated another .

## 2017-11-10 NOTE — ED Notes (Signed)
See EDP assessment 

## 2017-11-10 NOTE — ED Notes (Signed)
Urologist at bedside.

## 2017-11-10 NOTE — Anesthesia Preprocedure Evaluation (Signed)
Anesthesia Evaluation  Patient identified by MRN, date of birth, ID band Patient awake    Reviewed: Allergy & Precautions, NPO status , Patient's Chart, lab work & pertinent test results  Airway Mallampati: II  TM Distance: >3 FB Neck ROM: Full    Dental  (+) Teeth Intact, Dental Advisory Given   Pulmonary sleep apnea , Current Smoker,    Pulmonary exam normal breath sounds clear to auscultation       Cardiovascular hypertension, + angina + CAD and + Past MI  Normal cardiovascular exam Rhythm:Regular Rate:Normal     Neuro/Psych negative neurological ROS  negative psych ROS   GI/Hepatic Neg liver ROS, (+)     substance abuse  , Perforation of colon - s/p Sigmoid colectomy / diverting ileostomy   Endo/Other  negative endocrine ROS  Renal/GU negative Renal ROS   Urethra Foreign Body    Musculoskeletal negative musculoskeletal ROS (+)   Abdominal   Peds  Hematology negative hematology ROS (+)   Anesthesia Other Findings Day of surgery medications reviewed with the patient.  Reproductive/Obstetrics                             Anesthesia Physical Anesthesia Plan  ASA: II and emergent  Anesthesia Plan: General   Post-op Pain Management:    Induction: Intravenous  PONV Risk Score and Plan: 3 and Midazolam, Dexamethasone and Ondansetron  Airway Management Planned: Oral ETT  Additional Equipment:   Intra-op Plan:   Post-operative Plan: Extubation in OR  Informed Consent: I have reviewed the patients History and Physical, chart, labs and discussed the procedure including the risks, benefits and alternatives for the proposed anesthesia with the patient or authorized representative who has indicated his/her understanding and acceptance.   Dental advisory given  Plan Discussed with: CRNA  Anesthesia Plan Comments:         Anesthesia Quick Evaluation

## 2017-11-10 NOTE — Anesthesia Preprocedure Evaluation (Signed)
Anesthesia Evaluation  Patient identified by MRN, date of birth, ID band Patient awake    Reviewed: Allergy & Precautions, NPO status , Patient's Chart, lab work & pertinent test results  Airway Mallampati: I  TM Distance: >3 FB Neck ROM: Full    Dental   Pulmonary sleep apnea , Current Smoker,    Pulmonary exam normal        Cardiovascular hypertension, + CAD and + Cardiac Stents  Normal cardiovascular exam     Neuro/Psych    GI/Hepatic   Endo/Other    Renal/GU      Musculoskeletal   Abdominal   Peds  Hematology   Anesthesia Other Findings   Reproductive/Obstetrics                             Anesthesia Physical Anesthesia Plan  ASA: III  Anesthesia Plan: General   Post-op Pain Management:    Induction: Intravenous, Rapid sequence and Cricoid pressure planned  PONV Risk Score and Plan: 1  Airway Management Planned: Oral ETT  Additional Equipment:   Intra-op Plan:   Post-operative Plan: Extubation in OR  Informed Consent: I have reviewed the patients History and Physical, chart, labs and discussed the procedure including the risks, benefits and alternatives for the proposed anesthesia with the patient or authorized representative who has indicated his/her understanding and acceptance.     Plan Discussed with: CRNA and Surgeon  Anesthesia Plan Comments:         Anesthesia Quick Evaluation

## 2017-11-10 NOTE — ED Triage Notes (Signed)
The pt is c/o a paint brush in his penis since 1000  This am

## 2017-11-10 NOTE — ED Provider Notes (Signed)
[facility] EMERGENCY DEPARTMENT Provider Note   CSN: [id number] Arrival date & time: 11/10/17  1813     History   Chief Complaint Chief Complaint  Patient presents with  . fb in penis    HPI Scott Berry is a 57 y.o. male.  HPI   Scott Berry is a 57 y.o. male, with a history of CAD, HTN, and tobacco use, presenting to the ED with foreign body in the urethra.  Patient states he inserted a 7 to 8 inch paintbrush handle into the urethra around 8:30 AM this morning.  Pain is 7/10, sharp, radiating towards the scrotum.  He inserted it too far and was unable to get it out.  He has been able to urinate, though it is painful.  He also notes a small amount of bleeding from the urethra. Denies abdominal pain, fever, nausea/vomiting, or any other complaints.   Past Medical History:  Diagnosis Date  . Coronary artery arteriosclerosis 2011   cath with long tubular heavily calcified mid LAD 60%, calcified RCA with multiple 50% stenosis, ostial left circ stenosis of 30% and calcified LM with no stenosis on medical management  . Hypertension   . 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 Personal history of colonic polyps 08/15/2011  . Polysubstance abuse in past 08/15/2011  . Pure hypercholesterolemia   . Shortness of breath    if don't take medicines  . Sleep apnea 1995   not a problem now  . Tobacco abuse 08/15/2011    Patient Active Problem List   Diagnosis Date Noted  . History of MI (myocardial infarction) 10/26/2016  . CAP (community acquired pneumonia) 04/01/2015  . Coronary artery disease due to lipid rich plaque 06/02/2014  . Tobacco use 06/02/2014  . ERRONEOUS ENCOUNTER--DISREGARD 05/31/2014  . Dyslipidemia, goal LDL below 70 05/08/2014  . CAD- cath 05/05/14- med Rx- possible PCI in the future   . NSTEMI, initial episode of care (HCC) 05/05/2014  . Ventricular tachycardia, non-sustained (HCC) 05/05/2014  . Angina  decubitus (HCC) 04/26/2014  . Polysubstance abuse in past 08/15/2011  . Personal history of colonic polyps 08/15/2011  . Tobacco abuse 08/15/2011  . Hypertension 06/22/2011    Past Surgical History:  Procedure Laterality Date  . CARDIAC CATHETERIZATION  09/10/2009   Results in chart  . COLON SURGERY  14Jun2012   colectomy/diverting ileostomy - colon perf  . FOREIGN BODY REMOVAL RECTAL  Oct 2011  . ILEO LOOP DIVERSION  06/21/2011   Procedure: Takedown of loop ileostomy  . LEFT HEART CATHETERIZATION WITH CORONARY ANGIOGRAM N/A 05/05/2014   Procedure: LEFT HEART CATHETERIZATION WITH CORONARY ANGIOGRAM;  Surgeon: Micheline Chapman, MD;  Location: [facility] CATH LAB;  Service: Cardiovascular;  Laterality: N/A;  . Loop ileostomy takedown  20Dec2012   Procedure: Takedown of loop ileostomy  . PERCUTANEOUS CORONARY STENT INTERVENTION (PCI-S) Right 05/05/2014   Procedure: PERCUTANEOUS CORONARY STENT INTERVENTION (PCI-S);  Surgeon: Micheline Chapman, MD;  Location: [facility] CATH LAB;  Service: Cardiovascular;  Laterality: Right;  Prox RCA        Home Medications    Prior to Admission medications   Medication Sig Start Date End Date Taking? Authorizing Provider  aspirin EC 81 MG tablet Take 1 tablet (81 mg total) by mouth daily. 06/02/15   [name], MD  isosorbide mononitrate (IMDUR) 30 MG 24 hr tablet TAKE 1 TABLET BY MOUTH DAILY. MUST KEEP 10/26/16 APPT FOR MORE MEDICATION REFILLS 10/26/16  [name], MD    Family History Family History  Problem Relation Age of Onset  . Stroke Father   . Pancreatic disease Father   . Stroke Mother     Social History Social History   Tobacco Use  . Smoking status: Current Every Day Smoker    Packs/day: 0.50    Types: Cigarettes    Last attempt to quit: 07/03/2007    Years since quitting: 10.3  . Smokeless tobacco: Never Used  Substance Use Topics  . Alcohol use: Yes    Alcohol/week: 1.8 oz    Types: 3 Cans of beer per week    Comment: Recovered  alcoholic x 6 years  . Drug use: No    Types: Other-see comments    Comment:  None in about 25 years; But has been "working around acetone"     Allergies   Penicillins   Review of Systems Review of Systems  Constitutional: Negative for chills and fever.  Gastrointestinal: Negative for abdominal pain, nausea and vomiting.  Genitourinary: Positive for difficulty urinating, hematuria and penile pain. Negative for scrotal swelling.  Musculoskeletal: Negative for back pain.  All other systems reviewed and are negative.    Physical Exam Updated Vital Signs BP (!) 156/90 (BP Location: Right Arm)   Pulse 93   Temp 98.4 F (36.9 C) (Oral)   Resp 20   Ht  (1.626 m)   Wt 54.4 kg (120 lb)   SpO2 97%   BMI 20.60 kg/m   Physical Exam  Constitutional: He appears well-developed and well-nourished. No distress.  HENT:  Head: Normocephalic and atraumatic.  Eyes: Conjunctivae are normal.  Neck: Neck supple.  Cardiovascular: Normal rate, regular rhythm, normal heart sounds and intact distal pulses.  Pulmonary/Chest: Effort normal and breath sounds normal. No respiratory distress.  Abdominal: Soft. There is no tenderness. There is no guarding.  Genitourinary:  Genitourinary Comments: Patient's penis has no rigidity to it.  There is no noted foreign body at the urethral meatus or along the ventral urethra, however, there is a foreign body palpated at the base of the scrotum. No noted bleeding from urethral meatus.   Musculoskeletal: He exhibits no edema.  Lymphadenopathy:    He has no cervical adenopathy.  Neurological: He is alert.  Skin: Skin is warm and dry. He is not diaphoretic.  Psychiatric: He has a normal mood and affect. His behavior is normal.  Nursing note and vitals reviewed.    ED Treatments / Results  Labs (all labs ordered are listed, but only abnormal results are displayed) Labs Reviewed  BASIC METABOLIC PANEL - Abnormal; Notable for the following components:       Result Value   Chloride 100 (*)    Glucose, Bld 123 (*)    All other components within normal limits  CBC WITH DIFFERENTIAL/PLATELET - Abnormal; Notable for the following components:   Neutro Abs 8.8 (*)    Lymphs Abs 0.5 (*)    Monocytes Absolute 1.1 (*)    All other components within normal limits  URINALYSIS, ROUTINE W REFLEX MICROSCOPIC - Abnormal; Notable for the following components:   APPearance HAZY (*)    Hgb urine dipstick LARGE (*)    Ketones, ur 5 (*)    Protein, ur 30 (*)    Nitrite POSITIVE (*)    Leukocytes, UA LARGE (*)    RBC / HPF >50 (*)    WBC, UA >50 (*)    All other components within normal  limits  RAPID URINE DRUG SCREEN, HOSP PERFORMED    EKG None  Radiology Dg Pelvis 1-2 Views  Result Date: 11/10/2017 CLINICAL DATA:  Urethral foreign body, pain brush EXAM: PELVIS - 1-2 VIEW COMPARISON:  None. FINDINGS: No acute fracture or dislocation is noted. Diffuse vascular calcifications are seen. There is a linear radiolucency identified in the midline which is consistent with the patient's given clinical history of urethral foreign body. The lateral projection demonstrates some mottled air in the anterior soft tissues of the pelvis which may be related to the recent injury. IMPRESSION: Radiolucency identified in the midline which would correspond with the patient's given clinical history of urethral foreign body. Some mottled air is noted in the anterior soft tissues of the pelvis likely related to the recent injury. Electronically Signed   By: Alcide Clever M.D.   On: 11/10/2017 19:30    Procedures Procedures (including critical care time)  Medications Ordered in ED Medications  ciprofloxacin (CIPRO) IVPB 200 mg (has no administration in time range)  Tdap (BOOSTRIX) injection 0.5 mL (0.5 mLs Intramuscular Given 11/10/17 1947)  ondansetron (ZOFRAN) injection 4 mg (4 mg Intravenous Given 11/10/17 1947)  morphine 4 MG/ML injection 4 mg (4 mg Intravenous Given  11/10/17 1947)     Initial Impression / Assessment and Plan / ED Course  I have reviewed the triage vital signs and the nursing notes.  Pertinent labs & imaging results that were available during my care of the patient were reviewed by me and considered in my medical decision making (see chart for details).  Clinical Course as of Nov 11 2130  Wynelle Link Nov 10, 2017  1901 Spoke with Dr. Laverle Patter, urologist.  States he will come see the patient.  Keep patient NPO.    [SJ]  2034 Dr. Laverle Patter examined the patient. States he will be transferring patient to OR at [facility]. Requests 200 mg Cipro.    [SJ]    Clinical Course User Index [SJ] Micheline Markes C, PA-C    Patient presents with urethral pain following the insertion of a paintbrush handle.  I was unable to visualize a foreign body externally, however, evidence of foreign body deep into the urethra noted on x-ray.  Evaluated by urologist.  Patient to go to the OR.  Final Clinical Impressions(s) / ED Diagnoses   Final diagnoses:  Foreign body in urethra, initial encounter    ED Discharge Orders    None       Concepcion Living 11/10/17 2133    Mancel Bale, MD 11/11/17 [phone]

## 2017-11-10 NOTE — ED Notes (Signed)
Patient transported to CT 

## 2017-11-10 NOTE — ED Notes (Signed)
Patient transported to X-ray 

## 2017-11-10 NOTE — Consult Note (Signed)
Urology Consult   Physician requesting consult: Dr. Gray Bernhardt  Reason for consult: Foreign body in urethra  History of Present Illness: Scott Berry is a 57 y.o. male who inserted an 8 inch wooden paint brush handle in his urethra around 8 AM this morning.  He describes the handle as wooden and approximately the diameter of a pencil.  He has voided a small amount once with grossly clear urine.  He has had once small bowel movement today without blood.  He denies ever doing this before although there is documentation from 2011 of an episode when he inserted a lotion bottle in his rectum.  There was no GU involvement but he did have postoperative urinary retention and was seen by Dr. Sherron Monday for that reason.  He had a plain x-ray today that shows a radiolucency corresponding to the described foreign body that extends into the pelvis to the bulbar urethra.  He has not been febrile.  He does have a history of polysubstance abuse but states he has not done any drugs or had any alcohol recently.  He has a history of CAD but has not been on any of his medications recently.  He denies chest pain, palpitations, or dyspnea.  He denies a history of voiding or storage urinary symptoms, hematuria, UTIs, STDs, urolithiasis, GU malignancy/trauma/surgery.  Past Medical History:  Diagnosis Date  . Coronary artery arteriosclerosis 2011   cath with long tubular heavily calcified mid LAD 60%, calcified RCA with multiple 50% stenosis, ostial left circ stenosis of 30% and calcified LM with no stenosis on medical management  . Hypertension   . 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 Personal history of colonic polyps 08/15/2011  . Polysubstance abuse in past 08/15/2011  . Pure hypercholesterolemia   . Shortness of breath    if don't take medicines  . Sleep apnea 1995   not a problem now  . Tobacco abuse 08/15/2011    Past Surgical History:  Procedure Laterality  Date  . CARDIAC CATHETERIZATION  09/10/2009   Results in chart  . COLON SURGERY  14Jun2012   colectomy/diverting ileostomy - colon perf  . FOREIGN BODY REMOVAL RECTAL  Oct 2011  . ILEO LOOP DIVERSION  06/21/2011   Procedure: Takedown of loop ileostomy  . LEFT HEART CATHETERIZATION WITH CORONARY ANGIOGRAM N/A 05/05/2014   Procedure: LEFT HEART CATHETERIZATION WITH CORONARY ANGIOGRAM;  Surgeon: Micheline Chapman, MD;  Location: [facility] CATH LAB;  Service: Cardiovascular;  Laterality: N/A;  . Loop ileostomy takedown  20Dec2012   Procedure: Takedown of loop ileostomy  . PERCUTANEOUS CORONARY STENT INTERVENTION (PCI-S) Right 05/05/2014   Procedure: PERCUTANEOUS CORONARY STENT INTERVENTION (PCI-S);  Surgeon: Micheline Chapman, MD;  Location: [facility] CATH LAB;  Service: Cardiovascular;  Laterality: Right;  Prox RCA    Medications:  Home meds:  No outpatient medications have been marked as taking for the 11/10/17 encounter [facility] Encounter).    Scheduled Meds: Continuous Infusions: PRN Meds:.  Allergies:  Allergies  Allergen Reactions  . Penicillins Anaphylaxis    Has patient had a PCN reaction causing immediate rash, facial/tongue/throat swelling, SOB or lightheadedness with hypotension: Yes Has patient had a PCN reaction causing severe rash involving mucus membranes or skin necrosis:No Has patient had a PCN reaction that required hospitalization No Has patient had a PCN reaction occurring within the last 10 years: No If all of the above answers are "NO", then may proceed with Cephalosporin use.  Family History  Problem Relation Age of Onset  . Stroke Father   . Pancreatic disease Father   . Stroke Mother     Social History:  reports that he has been smoking cigarettes.  He has been smoking about 0.50 packs per day. He has never used smokeless tobacco. He reports that he drinks about 1.8 oz of alcohol per week. He reports that he does not use drugs.  ROS: A complete review of systems  was performed.  All systems are negative except for pertinent findings as noted.  Physical Exam:  Vital signs in last 24 hours: Temp:  [98.4 F (36.9 C)] 98.4 F (36.9 C) (05/12 1829) Pulse Rate:  [93] 93 (05/12 1829) Resp:  [20] 20 (05/12 1829) BP: (156)/(90) 156/90 (05/12 1829) SpO2:  [97 %] 97 % (05/12 1829) Weight:  [54.4 kg (120 lb)] 54.4 kg (120 lb) (05/12 1837) Constitutional:  Alert and oriented, No acute distress Cardiovascular: Regular rate and rhythm, No JVD Respiratory: Normal respiratory effort, Lungs clear bilaterally GI: Abdomen is soft, nontender, nondistended, no abdominal masses Genitourinary: No CVAT. Normal male phallus, testes are descended bilaterally and non-tender and without masses, scrotum is normal in appearance without lesions or masses, perineum is normal on inspection. I can palpate the end of the paintbrush handle just posterior to the scrotum at the bulbar urethra.  No obvious injury is noted cutaneously. Rectal: Normal sphincter tone, no rectal masses, prostate is non tender and without nodularity. Prostate size is estimated to be 30 cc. No foreign body palpable in rectum. Lymphatic: No lymphadenopathy Neurologic: Grossly intact, no focal deficits Psychiatric: Normal mood and affect  Laboratory Data:  Recent Labs    11/10/17 1909  WBC 10.4  HGB 14.6  HCT 42.8  PLT 269    No results for input(s): NA, K, CL, GLUCOSE, BUN, CALCIUM, CREATININE in the last 72 hours.  Invalid input(s): CO3   Results for orders placed or performed during the hospital encounter of 11/10/17 (from the past 24 hour(s))  Urine rapid drug screen (hosp performed)     Status: None   Collection Time: 11/10/17  6:56 PM  Result Value Ref Range   Opiates NONE DETECTED NONE DETECTED   Cocaine NONE DETECTED NONE DETECTED   Benzodiazepines NONE DETECTED NONE DETECTED   Amphetamines NONE DETECTED NONE DETECTED   Tetrahydrocannabinol NONE DETECTED NONE DETECTED   Barbiturates  NONE DETECTED NONE DETECTED  Urinalysis, Routine w reflex microscopic     Status: Abnormal   Collection Time: 11/10/17  6:56 PM  Result Value Ref Range   Color, Urine YELLOW YELLOW   APPearance HAZY (A) CLEAR   Specific Gravity, Urine 1.016 1.005 - 1.030   pH 6.0 5.0 - 8.0   Glucose, UA NEGATIVE NEGATIVE mg/dL   Hgb urine dipstick LARGE (A) NEGATIVE   Bilirubin Urine NEGATIVE NEGATIVE   Ketones, ur 5 (A) NEGATIVE mg/dL   Protein, ur 30 (A) NEGATIVE mg/dL   Nitrite POSITIVE (A) NEGATIVE   Leukocytes, UA LARGE (A) NEGATIVE   RBC / HPF >50 (H) 0 - 5 RBC/hpf   WBC, UA >50 (H) 0 - 5 WBC/hpf   Bacteria, UA NONE SEEN NONE SEEN  CBC with Differential     Status: Abnormal   Collection Time: 11/10/17  7:09 PM  Result Value Ref Range   WBC 10.4 4.0 - 10.5 K/uL   RBC 4.84 4.22 - 5.81 MIL/uL   Hemoglobin 14.6 13.0 - 17.0 g/dL   HCT 16.1 09.6 -  52.0 %   MCV 88.4 78.0 - 100.0 fL   MCH 30.2 26.0 - 34.0 pg   MCHC 34.1 30.0 - 36.0 g/dL   RDW 16.1 09.6 - 04.5 %   Platelets 269 150 - 400 K/uL   Neutrophils Relative % 85 %   Neutro Abs 8.8 (H) 1.7 - 7.7 K/uL   Lymphocytes Relative 5 %   Lymphs Abs 0.5 (L) 0.7 - 4.0 K/uL   Monocytes Relative 10 %   Monocytes Absolute 1.1 (H) 0.1 - 1.0 K/uL   Eosinophils Relative 0 %   Eosinophils Absolute 0.0 0.0 - 0.7 K/uL   Basophils Relative 0 %   Basophils Absolute 0.0 0.0 - 0.1 K/uL   No results found for this or any previous visit (from the past 240 hour(s)).  Renal Function: No results for input(s): CREATININE in the last 168 hours. CrCl cannot be calculated (Patient's most recent lab result is older than the maximum 21 days allowed.).  Radiologic Imaging: Dg Pelvis 1-2 Views  Result Date: 11/10/2017 CLINICAL DATA:  Urethral foreign body, pain brush EXAM: PELVIS - 1-2 VIEW COMPARISON:  None. FINDINGS: No acute fracture or dislocation is noted. Diffuse vascular calcifications are seen. There is a linear radiolucency identified in the midline which  is consistent with the patient's given clinical history of urethral foreign body. The lateral projection demonstrates some mottled air in the anterior soft tissues of the pelvis which may be related to the recent injury. IMPRESSION: Radiolucency identified in the midline which would correspond with the patient's given clinical history of urethral foreign body. Some mottled air is noted in the anterior soft tissues of the pelvis likely related to the recent injury. Electronically Signed   By: Alcide Clever M.D.   On: 11/10/2017 19:30    I independently reviewed the above imaging studies.  Impression/Recommendation Foreign body in urethra - Will plan to proceed to OR for cystoscopy and possible endoscopic removal.  I have concerns that endoscopic removal may not be possible considering the size and rigidity of the foreign body. I have prepared him that he may require open surgical removal either via a cystotomy or incision in perineum and urethra.  I discussed the potential benefits and risks of the procedure, side effects of the proposed treatment, the likelihood of the patient achieving the goals of the procedure, and any potential problems that might occur during the procedure or recuperation. He gives informed consent to proceed.  Chase Arnall,LES 11/10/2017, 8:39 PM    Moody Bruins MD  CC: Dr. Gray Bernhardt

## 2017-11-11 ENCOUNTER — Encounter (HOSPITAL_COMMUNITY): Payer: Self-pay | Admitting: Urology

## 2017-11-11 ENCOUNTER — Observation Stay (HOSPITAL_COMMUNITY): Payer: Self-pay

## 2017-11-11 ENCOUNTER — Other Ambulatory Visit: Payer: Self-pay

## 2017-11-11 DIAGNOSIS — T199XXA Foreign body in genitourinary tract, part unspecified, initial encounter: Secondary | ICD-10-CM | POA: Diagnosis present

## 2017-11-11 LAB — HIV ANTIBODY (ROUTINE TESTING W REFLEX): HIV Screen 4th Generation wRfx: NONREACTIVE

## 2017-11-11 MED ORDER — SUGAMMADEX SODIUM 200 MG/2ML IV SOLN
INTRAVENOUS | Status: DC | PRN
  Administered 2017-11-11: 200 mg via INTRAVENOUS

## 2017-11-11 MED ORDER — DIPHENHYDRAMINE HCL 50 MG/ML IJ SOLN: 12.5000 mg | Freq: Four times a day (QID) | INTRAMUSCULAR | Status: DC | PRN

## 2017-11-11 MED ORDER — LACTATED RINGERS IV SOLN
INTRAVENOUS | Status: DC
Start: 2017-11-11 — End: 2017-11-11

## 2017-11-11 MED ORDER — ONDANSETRON HCL 4 MG/2ML IJ SOLN: 4.0000 mg | INTRAMUSCULAR | Status: DC | PRN

## 2017-11-11 MED ORDER — DEXAMETHASONE SODIUM PHOSPHATE 10 MG/ML IJ SOLN
INTRAMUSCULAR | Status: DC | PRN
  Administered 2017-11-10: 10 mg via INTRAVENOUS

## 2017-11-11 MED ORDER — ONDANSETRON HCL 4 MG/2ML IJ SOLN
INTRAMUSCULAR | Status: DC | PRN
  Administered 2017-11-11: 4 mg via INTRAVENOUS

## 2017-11-11 MED ORDER — IOHEXOL 300 MG/ML  SOLN
INTRAMUSCULAR | Status: DC | PRN
  Administered 2017-11-11: 240 mL

## 2017-11-11 MED ORDER — ACETAMINOPHEN 325 MG PO TABS: 650.0000 mg | ORAL_TABLET | ORAL | Status: DC | PRN

## 2017-11-11 MED ORDER — BELLADONNA ALKALOIDS-OPIUM 16.2-60 MG RE SUPP: 1.0000 | Freq: Four times a day (QID) | RECTAL | Status: DC | PRN

## 2017-11-11 MED ORDER — SODIUM CHLORIDE 0.9% FLUSH: 3.0000 mL | INTRAVENOUS | Status: DC | PRN

## 2017-11-11 MED ORDER — CIPROFLOXACIN IN D5W 200 MG/100ML IV SOLN
200.0000 mg | Freq: Two times a day (BID) | INTRAVENOUS | Status: DC
  Administered 2017-11-11: 200 mg via INTRAVENOUS
  Filled 2017-11-11 (×2): qty 100

## 2017-11-11 MED ORDER — DIPHENHYDRAMINE HCL 12.5 MG/5ML PO ELIX: 12.5000 mg | ORAL_SOLUTION | Freq: Four times a day (QID) | ORAL | Status: DC | PRN

## 2017-11-11 MED ORDER — SODIUM CHLORIDE 0.9% FLUSH
3.0000 mL | Freq: Two times a day (BID) | INTRAVENOUS | Status: DC
  Administered 2017-11-11 (×2): 3 mL via INTRAVENOUS

## 2017-11-11 MED ORDER — SODIUM CHLORIDE 0.9 % IV SOLN: 250.0000 mL | INTRAVENOUS | Status: DC | PRN

## 2017-11-11 MED ORDER — 0.9 % SODIUM CHLORIDE (POUR BTL) OPTIME
TOPICAL | Status: DC | PRN
  Administered 2017-11-11: 1000 mL

## 2017-11-11 NOTE — Anesthesia Postprocedure Evaluation (Signed)
Anesthesia Post Note  Patient: Scott Berry  Procedure(s) Performed: CYSTOSCOPY/REMOVAL OF FOREIGN BODY FROM URETHRA/CYSTOGRAM (N/A ) EXPLORATORY LAPAROTOMY (N/A )     Patient location during evaluation: PACU Anesthesia Type: General Level of consciousness: awake and alert Pain management: pain level controlled Vital Signs Assessment: post-procedure vital signs reviewed and stable Respiratory status: spontaneous breathing, nonlabored ventilation, respiratory function stable and patient connected to nasal cannula oxygen Cardiovascular status: blood pressure returned to baseline and stable Postop Assessment: no apparent nausea or vomiting Anesthetic complications: no    Last Vitals:  Vitals:   11/11/17 0126 11/11/17 0532  BP: (!) 165/92 125/68  Pulse: 66 (!) 58  Resp: 16 12  Temp: (!) 36.3 C 36.6 C  SpO2: 97% 98%    Last Pain:  Vitals:   11/11/17 1202  TempSrc:   PainSc: 0-No pain                 Izadora Roehr DAVID

## 2017-11-11 NOTE — Transfer of Care (Signed)
Immediate Anesthesia Transfer of Care Note  Patient: Scott Berry  Procedure(s) Performed: Procedure(s): CYSTOSCOPY/REMOVAL OF FOREIGN BODY FROM URETHRA/CYSTOGRAM (N/A) EXPLORATORY LAPAROTOMY (N/A)  Patient Location: PACU  Anesthesia Type:General  Level of Consciousness:  sedated, patient cooperative and responds to stimulation  Airway & Oxygen Therapy:Patient Spontanous Breathing and Patient connected to face mask oxgen  Post-op Assessment:  Report given to PACU RN and Post -op Vital signs reviewed and stable  Post vital signs:  Reviewed and stable  Last Vitals:  Vitals:   11/10/17 2100 11/11/17 0027  BP: (!) 148/98   Pulse: 82   Resp: 18   Temp:  36.4 C  SpO2: 95%     Complications: No apparent anesthesia complications

## 2017-11-11 NOTE — Discharge Summary (Signed)
Date of admission: 11/10/2017  Date of discharge: 11/11/2017  Admission diagnosis: Foreign body in urethra  Discharge diagnosis: Foreign body in urethra  Secondary diagnoses: Cardiovascular disease, hypertension  History and Physical: For full details, please see admission history and physical. Briefly, Scott Berry is a 57 y.o. year old patient previously seen by Dr. Sherron Monday in the past who inserted an 8 inch paintbrush handle in his urethra.   Hospital Course: He was taken to the OR on 11/10/17 and underwent cystoscopy with endoscopic removal of a foreign body.  He had evidence of mucosal injury to the bladder but cystography confirmed no complete bladder injury.  He was maintained with urethral catheter drainage overnight and underwent a voiding trial on POD # 1 and passed this without difficulty.  Laboratory values:  Recent Labs    11/10/17 1909  HGB 14.6  HCT 42.8   Recent Labs    11/10/17 1909  CREATININE 1.04    Disposition: Home  Discharge instruction: He has no restrictions.  He was instructed not to place anything in his urethra.  Discharge medications:  Allergies as of 11/11/2017      Reactions   Penicillins Anaphylaxis   Has patient had a PCN reaction causing immediate rash, facial/tongue/throat swelling, SOB or lightheadedness with hypotension: Yes Has patient had a PCN reaction causing severe rash involving mucus membranes or skin necrosis:No Has patient had a PCN reaction that required hospitalization No Has patient had a PCN reaction occurring within the last 10 years: No If all of the above answers are "NO", then may proceed with Cephalosporin use.      Medication List    TAKE these medications   aspirin EC 81 MG tablet Take 1 tablet (81 mg total) by mouth daily.   isosorbide mononitrate 30 MG 24 hr tablet Commonly known as:  IMDUR TAKE 1 TABLET BY MOUTH DAILY. MUST KEEP 10/26/16 APPT FOR MORE MEDICATION REFILLS       Followup:  He should follow  up with his PCP for further routine medical care for his CV disease and hypertension and medication refills as he has not been taking these medications routinely.

## 2017-11-11 NOTE — Anesthesia Procedure Notes (Signed)
Procedure Name: Intubation Date/Time: 11/11/2017 11:35 PM Performed by: Anne Fu, CRNA Pre-anesthesia Checklist: Patient identified, Emergency Drugs available, Suction available, Patient being monitored and Timeout performed Patient Re-evaluated:Patient Re-evaluated prior to induction Oxygen Delivery Method: Circle system utilized Preoxygenation: Pre-oxygenation with 100% oxygen Induction Type: IV induction and Rapid sequence Laryngoscope Size: Mac and 4 Grade View: Grade I Tube type: Oral Tube size: 7.5 mm Number of attempts: 1 Airway Equipment and Method: Stylet Placement Confirmation: ETT inserted through vocal cords under direct vision,  positive ETCO2 and breath sounds checked- equal and bilateral Secured at: 21 cm Tube secured with: Tape Dental Injury: Teeth and Oropharynx as per pre-operative assessment

## 2017-11-11 NOTE — Op Note (Addendum)
Preoperative diagnosis: Foreign body in urethra and bladder  Postoperative diagnosis: Foreign body in urethra and bladder  Procedures: 1.  Cystoscopy 2.  Endoscopic removal of foreign body 3.  Cystogram  Surgeon: Rolly Salter, Montez Hageman MD  Anesthesia: General  Complications: None  EBL: None  Intraoperative findings: He was noted to have an 8 inch wooden paintbrush handle in his urethra.  After removal, his bladder was inspected and there was a mucosal injury toward the posterior midline of the bladder.  Intraoperative cystography demonstrated no evidence of extravasation.  Indication: This is a 56 year old gentleman who presented to the emergency department this evening with complaints that he had placed a foreign body within his urethra as described above.  It was recommended that he proceed to the operating room for cystoscopy and possible endoscopic removal versus possible cystotomy or perineal urethrostomy for removal.  The potential risks and benefits as well as the potential complications and the expected recovery process associated with these planned procedures were discussed in detail.  He gave informed consent.  Description of procedure: The patient was taken to the operating room and a general anesthetic was administered.  He was given preoperative antibiotics, placed in the dorsolithotomy position, and prepped and draped in the usual sterile fashion.  Next a preoperative timeout was performed.  Cystourethroscopy was then performed with a 21 French cystoscope sheath and a 30 degree lens.  In the bulbar urethra, the foreign body was encountered.  Using the rigid graspers, the foreign body was able to be grasped and was gently removed without difficulty.  It was confirmed to be an approximately 8 inch wooden paintbrush handle as the patient had described.  Cystoscopy was then again performed and there did not appear to be any evidence of urethral injury.  A complete evaluation of the  bladder revealed what appeared to be a mucosal injury toward the posterior midline without other abnormalities noted.  The ureteral orifices were in their expected location and were effluxing clear urine.  To confirm that the patient did not have a full-thickness bladder injury, 250 cc of Omnipaque was injected into the bladder under fluoroscopic guidance.  Both AP and lateral films were obtained with no evidence of extravasation.  This was confirmed on a post drainage film as well.  Once it was determined that there was no evidence of a bladder injury, a new 16 French Foley catheter was inserted for drainage overnight.  As the patient does not have any family or other caregivers who can provide assistance this evening, he will be admitted for observation with plans for a possible voiding trial in the morning and discharge home.

## 2017-11-11 NOTE — Progress Notes (Signed)
Patient ID: Scott Berry, male   DOB: 06-Nov-1960, 57 y.o.   MRN: [id number]  1 Day Post-Op Subjective: S/P removal of foreign body from urethra last night endoscopically.  No complaints this morning.  Objective: Vital signs in last 24 hours: Temp:  [97.4 F (36.3 C)-98.4 F (36.9 C)] 97.9 F (36.6 C) (05/13 0532) Pulse Rate:  [58-93] 58 (05/13 0532) Resp:  [12-21] 12 (05/13 0532) BP: (125-165)/(68-99) 125/68 (05/13 0532) SpO2:  [94 %-100 %] 98 % (05/13 0532) Weight:  [54.4 kg (120 lb)-59.4 kg (130 lb 15.3 oz)] 59.4 kg (130 lb 15.3 oz) (05/13 0123)  Intake/Output from previous day: 05/12 0701 - 05/13 0700 In: 1840 [P.O.:240; I.V.:1500; IV Piggyback:100] Out: 350 [Urine:350] Intake/Output this shift: No intake/output data recorded.  Physical Exam:  General: Alert and oriented GU: Urine clear  Lab Results: Recent Labs    11/10/17 1909  HGB 14.6  HCT 42.8   BMET Recent Labs    11/10/17 1909  NA 138  K 3.9  CL 100*  CO2 27  GLUCOSE 123*  BUN 13  CREATININE 1.04  CALCIUM 9.6     Procedure: I instilled 150 cc of sterile saline into his bladder and removed his Foley catheter.  He voided 120 cc.   Assessment/Plan: 1) Foreign body in urethra s/p removal: He passed his voiding trial.  Discharge home.  He was thoroughly counseled not to place things in his urethra and we discussed the potential serious consequences that may occur.    LOS: 1 day   Jessie Schrieber,LES 11/11/2017, 7:24 AM

## 2017-11-11 NOTE — Discharge Instructions (Signed)
1. You may see some blood in the urine and may have some burning with urination for 48-72 hours. You also may notice that you have to urinate more frequently or urgently after your procedure which is normal.  °2. You should call should you develop an inability urinate, fever > 101, persistent nausea and vomiting that prevents you from eating or drinking to stay hydrated.  °

## 2018-05-12 ENCOUNTER — Encounter (HOSPITAL_COMMUNITY): Payer: Self-pay | Admitting: Emergency Medicine

## 2018-05-12 ENCOUNTER — Emergency Department (HOSPITAL_COMMUNITY)
Admission: EM | Admit: 2018-05-12 | Discharge: 2018-05-12 | Disposition: A | Discharge status: Home / Self Care | Payer: Self-pay | Attending: Emergency Medicine | Admitting: Emergency Medicine

## 2018-05-12 DIAGNOSIS — F1099 Alcohol use, unspecified with unspecified alcohol-induced disorder: Secondary | ICD-10-CM | POA: Insufficient documentation

## 2018-05-12 DIAGNOSIS — Z5321 Procedure and treatment not carried out due to patient leaving prior to being seen by health care provider: Secondary | ICD-10-CM | POA: Insufficient documentation

## 2018-05-12 NOTE — ED Triage Notes (Signed)
Per pt, states he wants detox from ETOH-states he drinks about 2-3 cans of beer daily-wants to get into [facility]

## 2018-05-12 NOTE — ED Notes (Signed)
Pts family sts he is going to call resources for the pt to get him the detox help he needs. Pt dosent want to be seen at this time

## 2020-05-31 ENCOUNTER — Ambulatory Visit (INDEPENDENT_AMBULATORY_CARE_PROVIDER_SITE_OTHER): Payer: 59 | Admitting: Cardiology

## 2020-05-31 ENCOUNTER — Other Ambulatory Visit: Payer: Self-pay

## 2020-05-31 ENCOUNTER — Encounter: Payer: Self-pay | Admitting: Cardiology

## 2020-05-31 VITALS — BP 144/90 | HR 77 | Ht 68.0 in | Wt 130.6 lb

## 2020-05-31 DIAGNOSIS — I2510 Atherosclerotic heart disease of native coronary artery without angina pectoris: Secondary | ICD-10-CM

## 2020-05-31 DIAGNOSIS — E785 Hyperlipidemia, unspecified: Secondary | ICD-10-CM | POA: Diagnosis not present

## 2020-05-31 DIAGNOSIS — I2583 Coronary atherosclerosis due to lipid rich plaque: Secondary | ICD-10-CM | POA: Diagnosis not present

## 2020-05-31 DIAGNOSIS — R0602 Shortness of breath: Secondary | ICD-10-CM | POA: Diagnosis not present

## 2020-05-31 DIAGNOSIS — I739 Peripheral vascular disease, unspecified: Secondary | ICD-10-CM | POA: Diagnosis not present

## 2020-05-31 MED ORDER — ASPIRIN EC 81 MG PO TBEC: 81.0000 mg | DELAYED_RELEASE_TABLET | Freq: Every day | ORAL | 3 refills | Status: AC

## 2020-05-31 MED ORDER — ATORVASTATIN CALCIUM 40 MG PO TABS: 40.0000 mg | ORAL_TABLET | Freq: Every day | ORAL | 3 refills | Status: AC

## 2020-05-31 NOTE — Patient Instructions (Addendum)
Medication Instructions:  Please start Aspirin 81 mg a day. Start Atorvastatin 40 mg a day. Continue all other medications as listed.  *If you need a refill on your cardiac medications before your next appointment, please call your pharmacy*  Testing/Procedures: Your physician has requested that you have a lower extremity arterial exercise duplex. During this test, exercise and ultrasound are used to evaluate arterial blood flow in the legs. Allow one hour for this exam. There are no restrictions or special instructions. This test is completed at our [address] [facility] office.  Your physician has requested that you have an echocardiogram. Echocardiography is a painless test that uses sound waves to create images of your heart. It provides your doctor with information about the size and shape of your heart and how well your heart's chambers and valves are working. This procedure takes approximately one hour. There are no restrictions for this procedure.  This test is completed at [address]  [facility] [employer] office.  Follow-Up: At [facility], you and your health needs are our priority.  As part of our continuing mission to provide you with exceptional heart care, we have created designated Provider Care Teams.  These Care Teams include your primary Cardiologist (physician) and Advanced Practice Providers (APPs -  Physician Assistants and Nurse Practitioners) who all work together to provide you with the care you need, when you need it.  We recommend signing up for the patient portal called "MyChart".  Sign up information is provided on this After Visit Summary.  MyChart is used to connect with patients for Virtual Visits (Telemedicine).  Patients are able to view lab/test results, encounter notes, upcoming appointments, etc.  Non-urgent messages can be sent to your provider as well.   To learn more about what you can do with MyChart, go to [email or web address].    Your next appointment:   6  month(s)  The format for your next appointment:   In Person  Provider:   Donato Schultz, MD   Thank you for choosing [facility]!!

## 2020-05-31 NOTE — Progress Notes (Signed)
Cardiology Office Note:    Date:  05/31/2020   ID:  Scott Berry, DOB 01/21/1961, MRN [id number]  PCP:  Adrienne Mocha, PA  [facility] Cardiologist:  No primary care provider on file.  [facility] Electrophysiologist:  None   Referring MD: Adrienne Mocha, PA     History of Present Illness:    Scott Berry is a 59 y.o. male here for evaluation of CAD.  Is been over 3 years since last visit.  In review of prior notes, he has had prior drug use hypertension had nonsustained VT, EF 55%, cardiac catheterization revealed total occlusion of RCA with mild to moderate LAD and patent left circumflex.  Maximize medical therapy.  Had pneumonia in 2016.  Felt benefit from his Imdur for angina.  Cost has been an issue with some of his medications in the past.  Leg pain when working. Tired easy. No real chest pain.     Past Medical History:  Diagnosis Date  . Coronary artery arteriosclerosis 2011   cath with long tubular heavily calcified mid LAD 60%, calcified RCA with multiple 50% stenosis, ostial left circ stenosis of 30% and calcified LM with no stenosis on medical management  . Hypertension   . 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 Personal history of colonic polyps 08/15/2011  . Polysubstance abuse in past 08/15/2011  . Pure hypercholesterolemia   . Shortness of breath    if don't take medicines  . Sleep apnea 1995   not a problem now  . Tobacco abuse 08/15/2011    Past Surgical History:  Procedure Laterality Date  . CARDIAC CATHETERIZATION  09/10/2009   Results in chart  . COLON SURGERY  14Jun2012   colectomy/diverting ileostomy - colon perf  . CYSTOSCOPY N/A 11/10/2017   Procedure: CYSTOSCOPY/REMOVAL OF FOREIGN BODY FROM URETHRA/CYSTOGRAM;  Surgeon: Heloise Purpura, MD;  Location: [facility];  Service: Urology;  Laterality: N/A;  . FOREIGN BODY REMOVAL RECTAL  Oct 2011  . ILEO LOOP DIVERSION  06/21/2011   Procedure: Takedown of  loop ileostomy  . LAPAROTOMY N/A 11/10/2017   Procedure: EXPLORATORY LAPAROTOMY;  Surgeon: Heloise Purpura, MD;  Location: [facility];  Service: Urology;  Laterality: N/A;  . LEFT HEART CATHETERIZATION WITH CORONARY ANGIOGRAM N/A 05/05/2014   Procedure: LEFT HEART CATHETERIZATION WITH CORONARY ANGIOGRAM;  Surgeon: Micheline Chapman, MD;  Location: [facility] CATH LAB;  Service: Cardiovascular;  Laterality: N/A;  . Loop ileostomy takedown  20Dec2012   Procedure: Takedown of loop ileostomy  . PERCUTANEOUS CORONARY STENT INTERVENTION (PCI-S) Right 05/05/2014   Procedure: PERCUTANEOUS CORONARY STENT INTERVENTION (PCI-S);  Surgeon: Micheline Chapman, MD;  Location: [facility] CATH LAB;  Service: Cardiovascular;  Laterality: Right;  Prox RCA    Current Medications: No outpatient medications have been marked as taking for the 05/31/20 encounter (Office Visit) with [name], MD.     Allergies:   Penicillins   Social History   Socioeconomic History  . Marital status: Divorced    Spouse name: Not on file  . Number of children: Not on file  . Years of education: Not on file  . Highest education level: Not on file  Occupational History  . Not on file  Tobacco Use  . Smoking status: Current Every Day Smoker    Packs/day: 0.50    Types: Cigarettes    Last attempt to quit: 07/03/2007    Years since quitting: 12.9  . Smokeless tobacco: Never Used  Substance and Sexual Activity  . Alcohol use: Yes    Alcohol/week: 3.0 standard drinks    Types: 3 Cans of beer per week    Comment: Recovered alcoholic x 6 years  . Drug use: No    Types: Other-see comments    Comment:  None in about 25 years; But has been "working around acetone"  . Sexual activity: Not on file  Other Topics Concern  . Not on file  Social History Narrative  . Not on file   Social Determinants of Health   Financial Resource Strain:   . Difficulty of Paying Living Expenses: Not on file  Food Insecurity:   . Worried About Programme researcher, broadcasting/film/video  in the Last Year: Not on file  . Ran Out of Food in the Last Year: Not on file  Transportation Needs:   . Lack of Transportation (Medical): Not on file  . Lack of Transportation (Non-Medical): Not on file  Physical Activity:   . Days of Exercise per Week: Not on file  . Minutes of Exercise per Session: Not on file  Stress:   . Feeling of Stress : Not on file  Social Connections:   . Frequency of Communication with Friends and Family: Not on file  . Frequency of Social Gatherings with Friends and Family: Not on file  . Attends Religious Services: Not on file  . Active Member of Clubs or Organizations: Not on file  . Attends Banker Meetings: Not on file  . Marital Status: Not on file     Family History: The patient's family history includes Pancreatic disease in his father; Stroke in his father and mother.  ROS:   Please see the history of present illness.    No syncope no bleeding no orthopnea no PND all other systems reviewed and are negative.  EKGs/Labs/Other Studies Reviewed:    The following studies were reviewed today:  ECHO 2015: - Left ventricle: LVEF is approximately 50 to 55%with basal  inferior/inferoseptal  hypokinesis. The cavity size was normal. Wall thickness was  normal. Systolic function was normal. The estimated ejection  fraction was in the range of 50% to 55%. Doppler parameters are  consistent with abnormal left ventricular relaxation (grade 1  diastolic dysfunction).  - Right ventricle: Difficult to see RV free wall in apical images  Ovreall RVEF appears at least mildly decreased. The cavity size  was mildly dilated.  - Pulmonary arteries: PA peak pressure: 32 mm Hg (S).    EKG:  EKG is  ordered today.  The ekg ordered today demonstrates sinus rhythm 77 mildly peaked T waves noted.  Nonspecific ST-T wave changes in inferior leads.  Recent Labs: No results found for requested labs within last 8760 hours.  Recent Lipid  Panel    Component Value Date/Time   CHOL 149 05/06/2014 0339   TRIG 47 05/06/2014 0339   HDL 55 05/06/2014 0339   CHOLHDL 2.7 05/06/2014 0339   VLDL 9 05/06/2014 0339   LDLCALC 85 05/06/2014 0339       Physical Exam:    VS:  BP (!) 144/90   Pulse 77   Ht 5\' 8"  (1.727 m)   Wt 130 lb 9.6 oz (59.2 kg)   SpO2 93%   BMI 19.86 kg/m     Wt Readings from Last 3 Encounters:  05/31/20 130 lb 9.6 oz (59.2 kg)  05/12/18 101 lb 6 oz (46 kg)  11/11/17 130 lb 15.3 oz (59.4 kg)  GEN:  Well nourished, well developed in no acute distress HEENT: Normal NECK: No JVD; No carotid bruits LYMPHATICS: No lymphadenopathy CARDIAC: RRR, no murmurs, rubs, gallops RESPIRATORY:  Clear to auscultation without rales, wheezing or rhonchi  ABDOMEN: Soft, non-tender, non-distended MUSCULOSKELETAL:  No edema; No deformity difficult to palpate distal pulses SKIN: Warm and dry NEUROLOGIC:  Alert and oriented x 3 PSYCHIATRIC:  Normal affect   ASSESSMENT:    1. Coronary artery disease due to lipid rich plaque   2. Coronary artery disease involving native coronary artery of native heart without angina pectoris   3. Dyslipidemia, goal LDL below 70   4. Claudication in peripheral vascular disease (HCC)   5. Shortness of breath    PLAN:    In order of problems listed above:  Coronary disease with angina -Prior non-STEMI November 2015. -CTO of RCA.  Medication management. -Imdur has not been used in a while. -Restart aspirin 81 mg as well as atorvastatin 40 mg a day.  Encourage compliance.  SOB/dyspnea.  --hard with raking for instance.  Short winded.  Hard for him to work for more than 20 to 30 minutes at a time before having to stop and rest.  We will check an echocardiogram to ensure proper structure and function.  Previously 50 to 55% EF.  Leg burning pain with usage -We will check lower extremity Dopplers to make sure there is no evidence of peripheral vascular disease.  Right groin  hernia -Touch base again with primary care physician for referral to general surgery.  Tobacco use -Continue to encourage cessation.  Alcohol use -Requested detox from the ER and November 2019.  Went through rehab.  Here with his friend who helps employed him.  LDL 111 triglycerides 104 hemoglobin 14.2 creatinine 1.3 potassium 4.9 ALT 11  42-month follow-up  Medication Adjustments/Labs and Tests Ordered: Current medicines are reviewed at length with the patient today.  Concerns regarding medicines are outlined above.  Orders Placed This Encounter  Procedures  . EKG 12-Lead  . ECHOCARDIOGRAM COMPLETE  . VAS [place] LOWER EXTREMITY ARTERIAL DUPLEX  . VAS [place] ABI WITH/WO TBI   Meds ordered this encounter  Medications  . atorvastatin (LIPITOR) 40 MG tablet    Sig: Take 1 tablet (40 mg total) by mouth daily.    Dispense:  90 tablet    Refill:  3  . aspirin EC 81 MG tablet    Sig: Take 1 tablet (81 mg total) by mouth daily. Swallow whole.    Dispense:  90 tablet    Refill:  3    Patient Instructions  Medication Instructions:  Please start Aspirin 81 mg a day. Start Atorvastatin 40 mg a day. Continue all other medications as listed.  *If you need a refill on your cardiac medications before your next appointment, please call your pharmacy*  Testing/Procedures: Your physician has requested that you have a lower extremity arterial exercise duplex. During this test, exercise and ultrasound are used to evaluate arterial blood flow in the legs. Allow one hour for this exam. There are no restrictions or special instructions. This test is completed at our [address] [facility] office.  Your physician has requested that you have an echocardiogram. Echocardiography is a painless test that uses sound waves to create images of your heart. It provides your doctor with information about the size and shape of your heart and how well your heart's chambers and valves are working. This procedure takes  approximately one hour. There are no restrictions  for this procedure.  This test is completed at [address] office.  Follow-Up: At [facility], you and your health needs are our priority.  As part of our continuing mission to provide you with exceptional heart care, we have created designated Provider Care Teams.  These Care Teams include your primary Cardiologist (physician) and Advanced Practice Providers (APPs -  Physician Assistants and Nurse Practitioners) who all work together to provide you with the care you need, when you need it.  We recommend signing up for the patient portal called "MyChart".  Sign up information is provided on this After Visit Summary.  MyChart is used to connect with patients for Virtual Visits (Telemedicine).  Patients are able to view lab/test results, encounter notes, upcoming appointments, etc.  Non-urgent messages can be sent to your provider as well.   To learn more about what you can do with MyChart, go to [email or web address].    Your next appointment:   6 month(s)  The format for your next appointment:   In Person  Provider:   Donato SchultzMark Saraya Tirey, MD   Thank you for choosing [facility]!!         Signed, Donato SchultzMark Meeghan Skipper, MD  05/31/2020 1:39 PM    [facility]

## 2020-06-30 ENCOUNTER — Ambulatory Visit (HOSPITAL_BASED_OUTPATIENT_CLINIC_OR_DEPARTMENT_OTHER): Payer: 59

## 2020-06-30 ENCOUNTER — Other Ambulatory Visit: Payer: Self-pay

## 2020-06-30 ENCOUNTER — Ambulatory Visit (HOSPITAL_COMMUNITY)
Admission: RE | Admit: 2020-06-30 | Discharge: 2020-06-30 | Disposition: A | Discharge status: Home / Self Care | Payer: 59 | Source: Ambulatory Visit | Attending: Cardiology | Admitting: Cardiology

## 2020-06-30 DIAGNOSIS — I2510 Atherosclerotic heart disease of native coronary artery without angina pectoris: Secondary | ICD-10-CM | POA: Diagnosis not present

## 2020-06-30 DIAGNOSIS — I739 Peripheral vascular disease, unspecified: Secondary | ICD-10-CM | POA: Diagnosis present

## 2020-06-30 DIAGNOSIS — R0602 Shortness of breath: Secondary | ICD-10-CM | POA: Insufficient documentation

## 2020-06-30 DIAGNOSIS — I2583 Coronary atherosclerosis due to lipid rich plaque: Secondary | ICD-10-CM | POA: Insufficient documentation

## 2020-06-30 LAB — ECHOCARDIOGRAM COMPLETE
Area-P 1/2: 3.42 cm2
P 1/2 time: 465 msec
S' Lateral: 3.8 cm

## 2020-07-05 ENCOUNTER — Telehealth: Payer: Self-pay | Admitting: *Deleted

## 2020-07-05 NOTE — Telephone Encounter (Signed)
Left ventricular function mildly decreased when compared to prior study. EF 40 to 45%.  I would like for him to start Entresto 24/26 mg p.o. twice daily. Offer assistance plan.  Have him check basic metabolic profile in 2 weeks after starting. Also if possible, have him follow-up with APP in 2 weeks as well.   Donato Schultz, MD     Abnormal lower extremity Dopplers bilaterally. Please set up appointment with either Dr. Allyson Sabal or Dr. Kirke Corin.  Donato Schultz, MD   [name], MD  07/04/2020 2:13 PM EST      Right: Suspect occlusion of the external iliac artery, occlusion of the common femoral artery and superficial femoral artery  Left: Occlusion of the superficial femoral artery. Suspect occlusion of distal common femoral.  Please set up consultation with Dr. Allyson Sabal or Dr. Kirke Corin.  Donato Schultz, MD    Left message for pt to call back to discuss results of the above tests, need to start new medication, schedule lab and consult appointment with either Dr Allyson Sabal or Dr Kirke Corin.

## 2020-07-08 ENCOUNTER — Encounter: Payer: Self-pay | Admitting: *Deleted

## 2020-07-08 NOTE — Telephone Encounter (Signed)
Letter of results and requesting pt contact the office to review and scheduled mailed to his home address.

## 2020-09-11 ENCOUNTER — Emergency Department (HOSPITAL_COMMUNITY)
Admission: EM | Admit: 2020-09-11 | Discharge: 2020-09-11 | Disposition: A | Discharge status: Home / Self Care | Payer: 59 | Attending: Emergency Medicine | Admitting: Emergency Medicine

## 2020-09-11 ENCOUNTER — Emergency Department (HOSPITAL_COMMUNITY): Payer: 59

## 2020-09-11 ENCOUNTER — Encounter (HOSPITAL_COMMUNITY): Payer: Self-pay | Admitting: Emergency Medicine

## 2020-09-11 DIAGNOSIS — F17210 Nicotine dependence, cigarettes, uncomplicated: Secondary | ICD-10-CM | POA: Insufficient documentation

## 2020-09-11 DIAGNOSIS — Z955 Presence of coronary angioplasty implant and graft: Secondary | ICD-10-CM | POA: Diagnosis not present

## 2020-09-11 DIAGNOSIS — I2510 Atherosclerotic heart disease of native coronary artery without angina pectoris: Secondary | ICD-10-CM | POA: Insufficient documentation

## 2020-09-11 DIAGNOSIS — I10 Essential (primary) hypertension: Secondary | ICD-10-CM | POA: Insufficient documentation

## 2020-09-11 DIAGNOSIS — M79671 Pain in right foot: Secondary | ICD-10-CM | POA: Diagnosis present

## 2020-09-11 DIAGNOSIS — Z7982 Long term (current) use of aspirin: Secondary | ICD-10-CM | POA: Diagnosis not present

## 2020-09-11 MED ORDER — OXYCODONE-ACETAMINOPHEN 5-325 MG PO TABS
2.0000 | ORAL_TABLET | Freq: Once | ORAL | Status: AC
  Administered 2020-09-11: 2 via ORAL
  Filled 2020-09-11: qty 2

## 2020-09-11 NOTE — ED Provider Notes (Signed)
[facility] EMERGENCY DEPARTMENT Provider Note   CSN: [id number] Arrival date & time: 09/11/20  1258     History No chief complaint on file.   Scott Berry is a 60 y.o. male.  HPI 60 year old man history of coronary artery disease, hypertension who presents today complaining of right foot pain.  He states that he had pain that began in the mid right foot last Monday while raking.  He had no definitive injury.  The pain is sharp and occurs with movement and standing.  Patient is currently homeless.  He did stay outside last night.  He is a smoker denies any known history of peripheral vascular disease.  He denies history of gout.  He has not had streaking up his leg, discharge, or fever.    Past Medical History:  Diagnosis Date  . Coronary artery arteriosclerosis 2011   cath with long tubular heavily calcified mid LAD 60%, calcified RCA with multiple 50% stenosis, ostial left circ stenosis of 30% and calcified LM with no stenosis on medical management  . Hypertension   . 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 Personal history of colonic polyps 08/15/2011  . Polysubstance abuse in past 08/15/2011  . Pure hypercholesterolemia   . Shortness of breath    if don't take medicines  . Sleep apnea 1995   not a problem now  . Tobacco abuse 08/15/2011    Patient Active Problem List   Diagnosis Date Noted  . Foreign body GU tract 11/11/2017  . History of MI (myocardial infarction) 10/26/2016  . CAP (community acquired pneumonia) 04/01/2015  . Coronary artery disease due to lipid rich plaque 06/02/2014  . Tobacco use 06/02/2014  . ERRONEOUS ENCOUNTER--DISREGARD 05/31/2014  . Dyslipidemia, goal LDL below 70 05/08/2014  . CAD- cath 05/05/14- med Rx- possible PCI in the future   . NSTEMI, initial episode of care (HCC) 05/05/2014  . Ventricular tachycardia, non-sustained (HCC) 05/05/2014  . Angina decubitus (HCC) 04/26/2014  .  Polysubstance abuse in past 08/15/2011  . Personal history of colonic polyps 08/15/2011  . Tobacco abuse 08/15/2011  . Hypertension 06/22/2011    Past Surgical History:  Procedure Laterality Date  . CARDIAC CATHETERIZATION  09/10/2009   Results in chart  . COLON SURGERY  14Jun2012   colectomy/diverting ileostomy - colon perf  . CYSTOSCOPY N/A 11/10/2017   Procedure: CYSTOSCOPY/REMOVAL OF FOREIGN BODY FROM URETHRA/CYSTOGRAM;  Surgeon: Heloise Purpura, MD;  Location: [facility];  Service: Urology;  Laterality: N/A;  . FOREIGN BODY REMOVAL RECTAL  Oct 2011  . ILEO LOOP DIVERSION  06/21/2011   Procedure: Takedown of loop ileostomy  . LAPAROTOMY N/A 11/10/2017   Procedure: EXPLORATORY LAPAROTOMY;  Surgeon: Heloise Purpura, MD;  Location: [facility];  Service: Urology;  Laterality: N/A;  . LEFT HEART CATHETERIZATION WITH CORONARY ANGIOGRAM N/A 05/05/2014   Procedure: LEFT HEART CATHETERIZATION WITH CORONARY ANGIOGRAM;  Surgeon: Micheline Chapman, MD;  Location: [facility] CATH LAB;  Service: Cardiovascular;  Laterality: N/A;  . Loop ileostomy takedown  20Dec2012   Procedure: Takedown of loop ileostomy  . PERCUTANEOUS CORONARY STENT INTERVENTION (PCI-S) Right 05/05/2014   Procedure: PERCUTANEOUS CORONARY STENT INTERVENTION (PCI-S);  Surgeon: Micheline Chapman, MD;  Location: [facility] CATH LAB;  Service: Cardiovascular;  Laterality: Right;  Prox RCA       Family History  Problem Relation Age of Onset  . Stroke Father   . Pancreatic disease Father   . Stroke Mother  Social History   Tobacco Use  . Smoking status: Current Every Day Smoker    Packs/day: 0.50    Types: Cigarettes    Last attempt to quit: 07/03/2007    Years since quitting: 13.2  . Smokeless tobacco: Never Used  Substance Use Topics  . Alcohol use: Yes    Alcohol/week: 3.0 standard drinks    Types: 3 Cans of beer per week    Comment: Recovered alcoholic x 6 years  . Drug use: No    Types: Other-see comments    Comment:  None in about 25  years; But has been "working around [employer] Medications Prior to Admission medications   Medication Sig Start Date End Date Taking? Authorizing Provider  aspirin EC 81 MG tablet Take 1 tablet (81 mg total) by mouth daily. Swallow whole. 05/31/20   [name], MD  atorvastatin (LIPITOR) 40 MG tablet Take 1 tablet (40 mg total) by mouth daily. 05/31/20   [name], MD    Allergies    Penicillins  Review of Systems   Review of Systems  All other systems reviewed and are negative.   Physical Exam Updated Vital Signs BP 95/61 (BP Location: Right Arm)   Pulse 96   Temp (!) 97.5 F (36.4 C) (Oral)   Resp 16   SpO2 100%   Physical Exam Vitals and nursing note reviewed.  Constitutional:      General: He is not in acute distress.    Appearance: Normal appearance.  HENT:     Head: Normocephalic.     Right Ear: External ear normal.     Left Ear: External ear normal.     Nose: Nose normal.     Mouth/Throat:     Pharynx: Oropharynx is clear.  Eyes:     Pupils: Pupils are equal, round, and reactive to light.  Cardiovascular:     Rate and Rhythm: Normal rate and regular rhythm.  Pulmonary:     Effort: Pulmonary effort is normal.  Abdominal:     General: Abdomen is flat.     Palpations: Abdomen is soft.  Musculoskeletal:     Cervical back: Normal range of motion.     Comments: Right foot with some erythema from midfoot distally.  Toes are pink with capillary refill less than 2 seconds There is diffuse tenderness throughout the midfoot most pronounced over the second metatarsal  Neurological:     Mental Status: He is alert.     ED Results / Procedures / Treatments   Labs (all labs ordered are listed, but only abnormal results are displayed) Labs Reviewed - No data to display  EKG None  Radiology DG Foot Complete Right  Result Date: 09/11/2020 CLINICAL DATA:  Pain for 1 week EXAM: RIGHT FOOT COMPLETE - 3+ VIEW COMPARISON:  None. FINDINGS: Frontal,  oblique, and lateral views were obtained. There is no fracture or dislocation. Joint spaces appear normal. No erosive change. There is a small inferior calcaneal spur. IMPRESSION: No fracture or dislocation. No evident arthropathy. There is a small inferior calcaneal spur. Electronically Signed   By: Bretta Bang III M.D.   On: 09/11/2020 13:56    Procedures Procedures   Medications Ordered in ED Medications - No data to display  ED Course  I have reviewed the triage vital signs and the nursing notes.  Pertinent labs & imaging results that were available during my care of the patient were reviewed by me and considered in  my medical decision making (see chart for details).    MDM Rules/Calculators/A&P                          60 year old man, homeless, presents today complaining of right foot pain for 1 week.  He was walking on it when pain began.  He had no known direct trauma.  Patient has diffuse redness and tenderness to palpation with the point of maximal tenderness at the base of the second metatarsal.  No obvious external signs of trauma are noted.  Bilaterally pulses are dopplerable in the posterior tibialis but not on the dorsal pedalis.  No acute abnormality noted on films.  Plan cam walker, referral to ortho and vascular for follow up Discussed with Dr. Myra Gianotti.  Patient's left foot is somewhat pale but is not tender.  I do not think that he has an acute vascular occlusion.  However, due to poor social situation, I discussed with Dr. Myra Gianotti to facilitate patient obtaining care.  Patient advised of all the above, discussed return precautions and need for close follow-up and he voices understanding. Final Clinical Impression(s) / ED Diagnoses Final diagnoses:  Right foot pain    Rx / DC Orders ED Discharge Orders    None       Margarita Grizzle, MD 09/11/20 1450

## 2020-09-11 NOTE — Discharge Instructions (Signed)
There is not appear to be any acute broken bone. The pulses on both of your feet are weak.  Please call Dr. Estanislado Spire office for follow-up.  Please call Dr. Shelba Flake office for follow-up. If you begin having pain in your left foot, please come in for reevaluation. Please keep your feet warm and dry and avoid walking on your right foot is much as possible.  You are given a boot to assist you with walking and avoiding putting weight on the foot.

## 2020-09-11 NOTE — ED Triage Notes (Signed)
C/o R foot pain x 1 week.  No known injury.

## 2020-09-11 NOTE — Progress Notes (Signed)
Orthopedic Tech Progress Note Patient Details:  Scott Berry [birth year] [id number]  Ortho Devices Type of Ortho Device: CAM walker Ortho Device/Splint Location: Right Lower Extremity Ortho Device/Splint Interventions: Ordered,Application,Adjustment   Post Interventions Patient Tolerated: Well Instructions Provided: Adjustment of device,Care of device,Poper ambulation with device   Gerald Stabs 09/11/2020, 3:39 PM

## 2020-11-24 ENCOUNTER — Telehealth: Payer: Self-pay | Admitting: Cardiology

## 2020-11-24 ENCOUNTER — Emergency Department (HOSPITAL_COMMUNITY): Payer: 59

## 2020-11-24 ENCOUNTER — Other Ambulatory Visit: Payer: Self-pay

## 2020-11-24 ENCOUNTER — Inpatient Hospital Stay (HOSPITAL_COMMUNITY)
Admission: EM | Admit: 2020-11-24 | Discharge: 2020-11-30 | DRG: 070 | Disposition: A | Discharge status: Skilled Nursing Facility | Payer: 59 | Attending: Internal Medicine | Admitting: Internal Medicine

## 2020-11-24 DIAGNOSIS — G9341 Metabolic encephalopathy: Secondary | ICD-10-CM | POA: Diagnosis present

## 2020-11-24 DIAGNOSIS — G4733 Obstructive sleep apnea (adult) (pediatric): Secondary | ICD-10-CM | POA: Diagnosis present

## 2020-11-24 DIAGNOSIS — I2583 Coronary atherosclerosis due to lipid rich plaque: Secondary | ICD-10-CM | POA: Diagnosis present

## 2020-11-24 DIAGNOSIS — Z7982 Long term (current) use of aspirin: Secondary | ICD-10-CM

## 2020-11-24 DIAGNOSIS — E43 Unspecified severe protein-calorie malnutrition: Secondary | ICD-10-CM | POA: Insufficient documentation

## 2020-11-24 DIAGNOSIS — F1910 Other psychoactive substance abuse, uncomplicated: Secondary | ICD-10-CM | POA: Diagnosis not present

## 2020-11-24 DIAGNOSIS — Z5900 Homelessness unspecified: Secondary | ICD-10-CM

## 2020-11-24 DIAGNOSIS — Z20822 Contact with and (suspected) exposure to covid-19: Secondary | ICD-10-CM | POA: Diagnosis present

## 2020-11-24 DIAGNOSIS — E876 Hypokalemia: Secondary | ICD-10-CM | POA: Diagnosis present

## 2020-11-24 DIAGNOSIS — Z681 Body mass index (BMI) 19 or less, adult: Secondary | ICD-10-CM | POA: Diagnosis not present

## 2020-11-24 DIAGNOSIS — I252 Old myocardial infarction: Secondary | ICD-10-CM

## 2020-11-24 DIAGNOSIS — D649 Anemia, unspecified: Secondary | ICD-10-CM | POA: Diagnosis present

## 2020-11-24 DIAGNOSIS — E785 Hyperlipidemia, unspecified: Secondary | ICD-10-CM | POA: Diagnosis present

## 2020-11-24 DIAGNOSIS — I10 Essential (primary) hypertension: Secondary | ICD-10-CM | POA: Diagnosis present

## 2020-11-24 DIAGNOSIS — Z9114 Patient's other noncompliance with medication regimen: Secondary | ICD-10-CM

## 2020-11-24 DIAGNOSIS — F17210 Nicotine dependence, cigarettes, uncomplicated: Secondary | ICD-10-CM | POA: Diagnosis present

## 2020-11-24 DIAGNOSIS — Z955 Presence of coronary angioplasty implant and graft: Secondary | ICD-10-CM

## 2020-11-24 DIAGNOSIS — E871 Hypo-osmolality and hyponatremia: Secondary | ICD-10-CM | POA: Diagnosis present

## 2020-11-24 DIAGNOSIS — F1029 Alcohol dependence with unspecified alcohol-induced disorder: Secondary | ICD-10-CM | POA: Diagnosis not present

## 2020-11-24 DIAGNOSIS — W19XXXA Unspecified fall, initial encounter: Secondary | ICD-10-CM | POA: Diagnosis present

## 2020-11-24 DIAGNOSIS — Z9049 Acquired absence of other specified parts of digestive tract: Secondary | ICD-10-CM

## 2020-11-24 DIAGNOSIS — F1020 Alcohol dependence, uncomplicated: Secondary | ICD-10-CM | POA: Diagnosis present

## 2020-11-24 DIAGNOSIS — L89312 Pressure ulcer of right buttock, stage 2: Secondary | ICD-10-CM | POA: Diagnosis present

## 2020-11-24 DIAGNOSIS — R5381 Other malaise: Secondary | ICD-10-CM | POA: Diagnosis present

## 2020-11-24 DIAGNOSIS — E861 Hypovolemia: Secondary | ICD-10-CM | POA: Diagnosis present

## 2020-11-24 DIAGNOSIS — R7401 Elevation of levels of liver transaminase levels: Secondary | ICD-10-CM | POA: Diagnosis present

## 2020-11-24 DIAGNOSIS — L8990 Pressure ulcer of unspecified site, unspecified stage: Secondary | ICD-10-CM | POA: Insufficient documentation

## 2020-11-24 DIAGNOSIS — R1310 Dysphagia, unspecified: Secondary | ICD-10-CM | POA: Diagnosis present

## 2020-11-24 DIAGNOSIS — Z880 Allergy status to penicillin: Secondary | ICD-10-CM

## 2020-11-24 DIAGNOSIS — I2510 Atherosclerotic heart disease of native coronary artery without angina pectoris: Secondary | ICD-10-CM | POA: Diagnosis present

## 2020-11-24 DIAGNOSIS — Z720 Tobacco use: Secondary | ICD-10-CM | POA: Diagnosis present

## 2020-11-24 DIAGNOSIS — R64 Cachexia: Secondary | ICD-10-CM | POA: Diagnosis present

## 2020-11-24 LAB — I-STAT VENOUS BLOOD GAS, ED
Acid-Base Excess: 0 mmol/L (ref 0.0–2.0)
Bicarbonate: 22.1 mmol/L (ref 20.0–28.0)
Calcium, Ion: 0.99 mmol/L — ABNORMAL LOW (ref 1.15–1.40)
HCT: 38 % — ABNORMAL LOW (ref 39.0–52.0)
Hemoglobin: 12.9 g/dL — ABNORMAL LOW (ref 13.0–17.0)
O2 Saturation: 98 %
Potassium: 4.1 mmol/L (ref 3.5–5.1)
Sodium: 106 mmol/L — CL (ref 135–145)
TCO2: 23 mmol/L (ref 22–32)
pCO2, Ven: 29 mmHg — ABNORMAL LOW (ref 44.0–60.0)
pH, Ven: 7.491 — ABNORMAL HIGH (ref 7.250–7.430)
pO2, Ven: 89 mmHg — ABNORMAL HIGH (ref 32.0–45.0)

## 2020-11-24 LAB — CBC
HCT: 32.7 % — ABNORMAL LOW (ref 39.0–52.0)
HCT: 31 % — ABNORMAL LOW (ref 39.0–52.0)
Hemoglobin: 12 g/dL — ABNORMAL LOW (ref 13.0–17.0)
Hemoglobin: 11.5 g/dL — ABNORMAL LOW (ref 13.0–17.0)
MCH: 29.9 pg (ref 26.0–34.0)
MCH: 30.3 pg (ref 26.0–34.0)
MCHC: 36.7 g/dL — ABNORMAL HIGH (ref 30.0–36.0)
MCHC: 37.1 g/dL — ABNORMAL HIGH (ref 30.0–36.0)
MCV: 81.5 fL (ref 80.0–100.0)
MCV: 81.6 fL (ref 80.0–100.0)
Platelets: 362 10*3/uL (ref 150–400)
Platelets: 360 10*3/uL (ref 150–400)
RBC: 4.01 MIL/uL — ABNORMAL LOW (ref 4.22–5.81)
RBC: 3.8 MIL/uL — ABNORMAL LOW (ref 4.22–5.81)
RDW: 14.1 % (ref 11.5–15.5)
RDW: 14.1 % (ref 11.5–15.5)
WBC: 12.5 10*3/uL — ABNORMAL HIGH (ref 4.0–10.5)
WBC: 11.4 10*3/uL — ABNORMAL HIGH (ref 4.0–10.5)
nRBC: 0 % (ref 0.0–0.2)
nRBC: 0 % (ref 0.0–0.2)

## 2020-11-24 LAB — CREATININE, SERUM
Creatinine, Ser: 0.84 mg/dL (ref 0.61–1.24)
GFR, Estimated: >60 mL/min (ref >60)

## 2020-11-24 LAB — PROTIME-INR
INR: 1 (ref 0.8–1.2)
Prothrombin Time: 13.2 seconds (ref 11.4–15.2)

## 2020-11-24 LAB — COMPREHENSIVE METABOLIC PANEL
ALT: 62 U/L — ABNORMAL HIGH (ref 0–44)
AST: 88 U/L — ABNORMAL HIGH (ref 15–41)
Albumin: 3.3 g/dL — ABNORMAL LOW (ref 3.5–5.0)
Alkaline Phosphatase: 74 U/L (ref 38–126)
Anion gap: 17 — ABNORMAL HIGH (ref 5–15)
BUN: 7 mg/dL — ABNORMAL LOW (ref 8–23)
CO2: 19 mmol/L — ABNORMAL LOW (ref 22–32)
Calcium: 8.3 mg/dL — ABNORMAL LOW (ref 8.9–10.3)
Chloride: 71 mmol/L — ABNORMAL LOW (ref 98–111)
Creatinine, Ser: 0.86 mg/dL (ref 0.61–1.24)
GFR, Estimated: >60 mL/min (ref >60)
Glucose, Bld: 63 mg/dL — ABNORMAL LOW (ref 70–99)
Potassium: 4.1 mmol/L (ref 3.5–5.1)
Sodium: 107 mmol/L — CL (ref 135–145)
Total Bilirubin: 2.2 mg/dL — ABNORMAL HIGH (ref 0.3–1.2)
Total Protein: 6 g/dL — ABNORMAL LOW (ref 6.5–8.1)

## 2020-11-24 LAB — GLUCOSE, CAPILLARY: Glucose-Capillary: 88 mg/dL (ref 70–99)

## 2020-11-24 LAB — ETHANOL: Alcohol, Ethyl (B): <10 mg/dL (ref <10)

## 2020-11-24 LAB — RESP PANEL BY RT-PCR (FLU A&B, COVID) ARPGX2
Influenza A by PCR: NEGATIVE
Influenza B by PCR: NEGATIVE
SARS Coronavirus 2 by RT PCR: NEGATIVE

## 2020-11-24 LAB — CK: Total CK: 664 U/L — ABNORMAL HIGH (ref 49–397)

## 2020-11-24 LAB — SODIUM
Sodium: 111 mmol/L — CL (ref 135–145)
Sodium: 111 mmol/L — CL (ref 135–145)

## 2020-11-24 LAB — APTT: aPTT: 30 seconds (ref 24–36)

## 2020-11-24 LAB — FOLATE: Folate: 8.9 ng/mL (ref >5.9)

## 2020-11-24 MED ORDER — DEXTROSE 50 % IV SOLN
1.0000 | Freq: Once | INTRAVENOUS | Status: AC
  Administered 2020-11-24: 50 mL via INTRAVENOUS
  Filled 2020-11-24: qty 50

## 2020-11-24 MED ORDER — LORAZEPAM 2 MG/ML IJ SOLN
1.0000 mg | INTRAMUSCULAR | Status: DC | PRN
  Administered 2020-11-26: 1 mg via INTRAVENOUS
  Administered 2020-11-26 – 2020-11-27 (×4): 2 mg via INTRAVENOUS
  Filled 2020-11-24 (×4): qty 1

## 2020-11-24 MED ORDER — SODIUM CHLORIDE 0.9 % IV BOLUS
1000.0000 mL | Freq: Once | INTRAVENOUS | Status: AC
  Administered 2020-11-24: 1000 mL via INTRAVENOUS

## 2020-11-24 MED ORDER — THIAMINE HCL 100 MG/ML IJ SOLN
500.0000 mg | Freq: Three times a day (TID) | INTRAVENOUS | Status: DC
  Administered 2020-11-24 – 2020-11-25 (×2): 500 mg via INTRAVENOUS
  Filled 2020-11-24 (×4): qty 5

## 2020-11-24 MED ORDER — POLYETHYLENE GLYCOL 3350 17 G PO PACK: 17.0000 g | PACK | Freq: Every day | ORAL | Status: DC | PRN

## 2020-11-24 MED ORDER — LORAZEPAM 2 MG/ML IJ SOLN
0.5000 mg | Freq: Once | INTRAMUSCULAR | Status: AC
  Administered 2020-11-24: 0.5 mg via INTRAVENOUS
  Filled 2020-11-24: qty 1

## 2020-11-24 MED ORDER — ENOXAPARIN SODIUM 40 MG/0.4ML IJ SOSY
40.0000 mg | PREFILLED_SYRINGE | Freq: Every day | INTRAMUSCULAR | Status: DC
  Administered 2020-11-24 – 2020-11-30 (×7): 40 mg via SUBCUTANEOUS
  Filled 2020-11-24 (×7): qty 0.4

## 2020-11-24 MED ORDER — INSULIN ASPART 100 UNIT/ML IJ SOLN
0.0000 [IU] | INTRAMUSCULAR | Status: DC
  Administered 2020-11-26 – 2020-11-28 (×4): 1 [IU] via SUBCUTANEOUS

## 2020-11-24 MED ORDER — SODIUM CHLORIDE 3 % IV SOLN
INTRAVENOUS | Status: DC
  Filled 2020-11-24 (×2): qty 500

## 2020-11-24 MED ORDER — SODIUM CHLORIDE 3 % IV SOLN: INTRAVENOUS | Status: DC

## 2020-11-24 MED ORDER — SODIUM CHLORIDE 3 % IV SOLN
INTRAVENOUS | Status: DC
  Filled 2020-11-24: qty 500

## 2020-11-24 MED ORDER — SODIUM CHLORIDE 3 % IV SOLN
50.0000 mL/h | INTRAVENOUS | Status: DC
  Administered 2020-11-24: 50 mL/h via INTRAVENOUS
  Filled 2020-11-24: qty 500

## 2020-11-24 MED ORDER — DOCUSATE SODIUM 100 MG PO CAPS: 100.0000 mg | ORAL_CAPSULE | Freq: Two times a day (BID) | ORAL | Status: DC | PRN

## 2020-11-24 MED ORDER — DEXMEDETOMIDINE HCL IN NACL 400 MCG/100ML IV SOLN
0.2000 ug/kg/h | INTRAVENOUS | Status: DC
  Administered 2020-11-24: 0.2 ug/kg/h via INTRAVENOUS
  Filled 2020-11-24 (×2): qty 100

## 2020-11-24 MED ORDER — ADULT MULTIVITAMIN LIQUID CH
15.0000 mL | Freq: Every day | ORAL | Status: DC
  Filled 2020-11-24: qty 15

## 2020-11-24 NOTE — ED Triage Notes (Signed)
Transient that hangs around [facility]. [facility] authority called due to patient not acting normal. This morning patient had fall refused to come to hospital. Goose egg noted on left side of head. Unknown if patient is on blood thinners. Patient is alert oriented to self, place and time. Patient appears very weak at this time. Heavy breathing noted, however oxygen saturation normal and patient denies any pain, denies breathing difficulty.

## 2020-11-24 NOTE — Progress Notes (Signed)
eLink Physician-Brief Progress Note Patient Name: Scott Berry DOB: 1960/08/25 MRN: [id number]   Date of Service  11/24/2020  HPI/Events of Note  Patient admitted with a diagnosis of profound hyponatremia and altered mental status in the context of chronic alcoholism and homelessness, abnormal LFT's likely reflect ETOH liver disease.  eICU Interventions  New Patient Evaluation.        [name] 11/24/2020, 11:15 PM

## 2020-11-24 NOTE — H&P (Signed)
NAME:  Scott Berry MRN:  [id number] DOB:  [birth year] LOS: 0 ADMISSION DATE:  11/24/2020 CONSULTATION DATE:  11/24/2020 REFERRING MD:  Margarita Grizzle CHIEF COMPLAINT:  Hyponatremia   History of Present Illness:  60 year old man with PMHx significant for HTN, HLD, CAD (NSTEMI s/p PCI to proximal RCA 2015), OSA, polysubstance abuse, colon perforation (s/p sigmoid colectomy/diverting ileostomy with subsequent takedown) presenting with altered mental status and hyponatremia after a fall.  Of note, patient is reportedly homeless and around [place].   this morning patient had a fall and EMS was called.  However patient refused to come in this morning.On arrival to ED, patient was noted to have a significant area of swelling localized to his left forehead and was very weak with increased work of breathing.  Was too confused at that time to refuse coming to the ED.  Patient was brought to the ED where he continued to be confused.  On exam patient able to state part of his name.  Also able to state the year.  Unable to state where he is or why he is here.  He states that he is confused.  Is intermittently following commands.  Work-up in ED most remarkable for sodium of 106 and 107.  Transaminases mildly elevated with AST of 88 and ALT of 62.  Total bilirubin of 2.2.  Bicarb at 19.  CBC stable with mild leukocytosis of 12.5 hemoglobin of 12.0.  pH is 7.49 with PCO2 of 29.  Patient showing signs of Cheyne-Stokes breathing on exam.  Pertinent Medical History:   Past Medical History:  Diagnosis Date  . Coronary artery arteriosclerosis 2011   cath with long tubular heavily calcified mid LAD 60%, calcified RCA with multiple 50% stenosis, ostial left circ stenosis of 30% and calcified LM with no stenosis on medical management  . Hypertension   . 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 Personal history of colonic polyps 08/15/2011  . Polysubstance abuse in past  08/15/2011  . Pure hypercholesterolemia   . Shortness of breath    if don't take medicines  . Sleep apnea 1995   not a problem now  . Tobacco abuse 08/15/2011   Significant Hospital Events: Including procedures, antibiotic start and stop dates in addition to other pertinent events   . 5/26 - Presented to [facility] with AMS, Na 106  Interim History / Subjective:    Objective:  Blood pressure 138/73, pulse 97, temperature 99.1 F (37.3 C), temperature source Rectal, resp. rate 18, height 5\' 8"  (1.727 m), weight 59 kg, SpO2 91 %.       No intake or output data in the 24 hours ending 11/24/20 2053 Filed Weights   11/24/20 1814  Weight: 59 kg   Physical Examination: General:  Patient in no acute distress answering some questions.  But very confused HEENT: [place]/AT, anicteric sclera, PERRL, dry mucous membranes Neuro: Patient able to move all extremities.  No focal deficits.  Eyes equally round and reactive to light.  Alert and oriented x2 CV: RRR, no m/g/r. PULM: Breathing even and unlabored on exam. Lung fields clear to auscultation bilaterally. GI: Soft, nontender, nondistended. Normoactive bowel sounds. Extremities:  No LE edema noted. Skin: Warm/dry, with dirt noted on hands and face.  Labs/imaging that I have personally reviewed: (right click and "Reselect all SmartList Selections" daily)  WBC 12.5 (10.4), H&H 12.0/32.7, Plt 362  Na 107, K 4.1, Cl 71, CO2 19, BUN 7, Cr 0.86  Ca 8.3, Glucose 63   Ethanol < 10  UA, UDS pending  CK, osmolality, repeat Na pending  CXR negative for acute process   CT Head NAICA, mild atrophy Resolved Hospital Problem List:     Assessment & Plan:  This is a 60 year old male with history as noted above who presents for altered mental status.  Found to have hyponatremia  Hyponatremia-sodium of 106 per adrogus-madias equation n can initiate hypertonic saline at a rate of around 40 cc's/h.  Patient showed rapid correction at 50 cc.  We will start  rate closer to 30 cc/h -Sodiums every 2 hours -Patient not having a crisis or emergency at this time thus no reason for bolus.  Monitor for seizures.  Neurochecks every 1 hour -Agree with work-up with urine sodium, serum osmolality and urine osmolality   Alcoholism history of.  Patient does endorse that he drinks daily unsure of how much. -CIWA protocol -High-dose thiamine -Multivitamin -CMP with mag Foss in the a.m. -Folate and B12 levels  Transaminitis-likely related to alcohol ingestion chronically.  Also accompanied by elevated total bili.  Albumin is marginal. -Needs PT INR -Consider lover ultrasound in future to assess for potential cirrhosis   Homelessness- Unknown recent medical history. Uknown contacts and next of kin Concern for etoh abuse -Will need to discuss with case management in AM. Full code for now    Best Practice: (right click and "Reselect all SmartList Selections" daily)  Diet:  NpO for now Pain/Anxiety/Delirium protocol (if indicated): Ativan PRN VAP protocol (if indicated): NA DVT prophylaxis:Lovenox GI prophylaxis: NA Glucose control:  Sugars Q 4 hours Central venous access:  Not indicated for now Arterial line: NA Foley: NA Mobility:  TBD PT consulted:NA Last date of multidisciplinary goals of care discussion Attmpted to call any famiyl. Contacts in chart not answering. No one answering home phone. Patient homeless so it may be difficult to contact surrogate decision maker Code Status:  Full code for now Disposition: ICU for now  Labs:   CBC: Recent Labs  Lab 11/24/20 1839 11/24/20 1922  WBC 12.5*  --   HGB 12.0* 12.9*  HCT 32.7* 38.0*  MCV 81.5  --   PLT 362  --     Basic Metabolic Panel: Recent Labs  Lab 11/24/20 1839 11/24/20 1922  NA 107* 106*  K 4.1 4.1  CL 71*  --   CO2 19*  --   GLUCOSE 63*  --   BUN 7*  --   CREATININE 0.86  --   CALCIUM 8.3*  --    GFR: Estimated Creatinine Clearance: 74.3 mL/min (by C-G formula  based on SCr of 0.86 mg/dL). Recent Labs  Lab 11/24/20 1839  WBC 12.5*    Liver Function Tests: Recent Labs  Lab 11/24/20 1839  AST 88*  ALT 62*  ALKPHOS 74  BILITOT 2.2*  PROT 6.0*  ALBUMIN 3.3*   No results for input(s): LIPASE, AMYLASE in the last 168 hours. No results for input(s): AMMONIA in the last 168 hours.  ABG:    Component Value Date/Time   PHART 7.460 (H) 06/24/2011 2220   PCO2ART 43.6 06/24/2011 2220   PO2ART 63.0 (L) 06/24/2011 2220   HCO3 22.1 11/24/2020 1922   TCO2 23 11/24/2020 1922   O2SAT 98.0 11/24/2020 1922     Coagulation Profile: No results for input(s): INR, PROTIME in the last 168 hours.  Cardiac Enzymes: No results for input(s): CKTOTAL, CKMB, CKMBINDEX, TROPONINI in the last 168 hours.  HbA1C: No  results found for: HGBA1C  CBG: No results for input(s): GLUCAP in the last 168 hours.  Review of Systems:   Review of systems completed with pertinent positives/negatives outlined in above HPI.  Past Medical History:  He,  has a past medical history of Coronary artery arteriosclerosis (2011), Hypertension, Perforation of colon - s/p Sigmoid colectomy / diverting ileostomy (01/30/2011), Personal history of colonic polyps (08/15/2011), Polysubstance abuse in past (08/15/2011), Pure hypercholesterolemia, Shortness of breath, Sleep apnea (1995), and Tobacco abuse (08/15/2011).   Surgical History:   Past Surgical History:  Procedure Laterality Date  . CARDIAC CATHETERIZATION  09/10/2009   Results in chart  . COLON SURGERY  14Jun2012   colectomy/diverting ileostomy - colon perf  . CYSTOSCOPY N/A 11/10/2017   Procedure: CYSTOSCOPY/REMOVAL OF FOREIGN BODY FROM URETHRA/CYSTOGRAM;  Surgeon: Heloise Purpura, MD;  Location: [facility];  Service: Urology;  Laterality: N/A;  . FOREIGN BODY REMOVAL RECTAL  Oct 2011  . ILEO LOOP DIVERSION  06/21/2011   Procedure: Takedown of loop ileostomy  . LAPAROTOMY N/A 11/10/2017   Procedure: EXPLORATORY LAPAROTOMY;   Surgeon: Heloise Purpura, MD;  Location: [facility];  Service: Urology;  Laterality: N/A;  . LEFT HEART CATHETERIZATION WITH CORONARY ANGIOGRAM N/A 05/05/2014   Procedure: LEFT HEART CATHETERIZATION WITH CORONARY ANGIOGRAM;  Surgeon: Micheline Chapman, MD;  Location: [facility] CATH LAB;  Service: Cardiovascular;  Laterality: N/A;  . Loop ileostomy takedown  20Dec2012   Procedure: Takedown of loop ileostomy  . PERCUTANEOUS CORONARY STENT INTERVENTION (PCI-S) Right 05/05/2014   Procedure: PERCUTANEOUS CORONARY STENT INTERVENTION (PCI-S);  Surgeon: Micheline Chapman, MD;  Location: [facility] CATH LAB;  Service: Cardiovascular;  Laterality: Right;  Prox RCA     Social History:   reports that he has been smoking cigarettes. He has been smoking about 0.50 packs per day. He has never used smokeless tobacco. He reports current alcohol use of about 3.0 standard drinks of alcohol per week. He reports that he does not use drugs.   Family History:  His family history includes Pancreatic disease in his father; Stroke in his father and mother.   Allergies Allergies  Allergen Reactions  . Penicillins Anaphylaxis    Has patient had a PCN reaction causing immediate rash, facial/tongue/throat swelling, SOB or lightheadedness with hypotension: Yes Has patient had a PCN reaction causing severe rash involving mucus membranes or skin necrosis:No Has patient had a PCN reaction that required hospitalization No Has patient had a PCN reaction occurring within the last 10 years: No If all of the above answers are "NO", then may proceed with Cephalosporin use.      Home Medications  Prior to Admission medications   Medication Sig Start Date End Date Taking? Authorizing Provider  aspirin EC 81 MG tablet Take 1 tablet (81 mg total) by mouth daily. Swallow whole. 05/31/20   [name], MD  atorvastatin (LIPITOR) 40 MG tablet Take 1 tablet (40 mg total) by mouth daily. 05/31/20   [name], MD     Critical care time: 46 minutes     Vertis Kelch DO Internal Medicine/Pediatrics Pulmonary and Critical Care Fellow PGY-7  [facility] 11/24/20 8:53 PM  Please see [email or web address] for pager details.  From 7A-7P if no response, please call [phone] After hours, please call E-Link [phone]

## 2020-11-24 NOTE — Telephone Encounter (Signed)
Patient's advocate, [name] called to inform she receive a reminder call for his appointment 11/29/2020. She states the patient is homeless and she has not had contact with him in 4 weeks and is not sure where he is. She states she picked up his prescriptions and left them in her basement and he did pick them up. She states she wanted to inform she is not sure if he will come to the appointment Tuesday. She states a bill for him arrived at her address as well and he will not pay it. She states the last number she was given for him is: [phone]

## 2020-11-24 NOTE — Telephone Encounter (Signed)
Received information from pt's advocate, Angelia.  She is unsure of where he is and if he will come to his scheduled appt on 5/31.  Attempted to call the phone # left by her for him with no answer and no VM - unable to leave a message.  appt note made of this.

## 2020-11-24 NOTE — ED Provider Notes (Addendum)
[facility] EMERGENCY DEPARTMENT Provider Note   CSN: [id number] Arrival date & time:        History Chief Complaint  Patient presents with  . Fatigue    Scott Berry is a 60 y.o. male.  HPI  Level 5 caveat secondary to confusion 60 year old man history of polysubstance abuse, homelessness, presents today via EMS with reports of altered mental status.  EMS reports that he was [place] he often [place].  Bystanders and local businesses noted today that he was not acting normally.  He had fallen and had swelling to his head.  He continued to be confused and bystanders eventually called EMS.     Past Medical History:  Diagnosis Date  . Coronary artery arteriosclerosis 2011   cath with long tubular heavily calcified mid LAD 60%, calcified RCA with multiple 50% stenosis, ostial left circ stenosis of 30% and calcified LM with no stenosis on medical management  . Hypertension   . 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 Personal history of colonic polyps 08/15/2011  . Polysubstance abuse in past 08/15/2011  . Pure hypercholesterolemia   . Shortness of breath    if don't take medicines  . Sleep apnea 1995   not a problem now  . Tobacco abuse 08/15/2011    Patient Active Problem List   Diagnosis Date Noted  . Hyponatremia 11/24/2020  . Foreign body GU tract 11/11/2017  . History of MI (myocardial infarction) 10/26/2016  . CAP (community acquired pneumonia) 04/01/2015  . Coronary artery disease due to lipid rich plaque 06/02/2014  . Tobacco use 06/02/2014  . ERRONEOUS ENCOUNTER--DISREGARD 05/31/2014  . Dyslipidemia, goal LDL below 70 05/08/2014  . CAD- cath 05/05/14- med Rx- possible PCI in the future   . NSTEMI, initial episode of care (HCC) 05/05/2014  . Ventricular tachycardia, non-sustained (HCC) 05/05/2014  . Angina decubitus (HCC) 04/26/2014  . Polysubstance abuse in past 08/15/2011  . Personal history of  colonic polyps 08/15/2011  . Tobacco abuse 08/15/2011  . Hypertension 06/22/2011    Past Surgical History:  Procedure Laterality Date  . CARDIAC CATHETERIZATION  09/10/2009   Results in chart  . COLON SURGERY  14Jun2012   colectomy/diverting ileostomy - colon perf  . CYSTOSCOPY N/A 11/10/2017   Procedure: CYSTOSCOPY/REMOVAL OF FOREIGN BODY FROM URETHRA/CYSTOGRAM;  Surgeon: Heloise Purpura, MD;  Location: [facility];  Service: Urology;  Laterality: N/A;  . FOREIGN BODY REMOVAL RECTAL  Oct 2011  . ILEO LOOP DIVERSION  06/21/2011   Procedure: Takedown of loop ileostomy  . LAPAROTOMY N/A 11/10/2017   Procedure: EXPLORATORY LAPAROTOMY;  Surgeon: Heloise Purpura, MD;  Location: [facility];  Service: Urology;  Laterality: N/A;  . LEFT HEART CATHETERIZATION WITH CORONARY ANGIOGRAM N/A 05/05/2014   Procedure: LEFT HEART CATHETERIZATION WITH CORONARY ANGIOGRAM;  Surgeon: Micheline Chapman, MD;  Location: [facility] CATH LAB;  Service: Cardiovascular;  Laterality: N/A;  . Loop ileostomy takedown  20Dec2012   Procedure: Takedown of loop ileostomy  . PERCUTANEOUS CORONARY STENT INTERVENTION (PCI-S) Right 05/05/2014   Procedure: PERCUTANEOUS CORONARY STENT INTERVENTION (PCI-S);  Surgeon: Micheline Chapman, MD;  Location: [facility] CATH LAB;  Service: Cardiovascular;  Laterality: Right;  Prox RCA       Family History  Problem Relation Age of Onset  . Stroke Father   . Pancreatic disease Father   . Stroke Mother     Social History   Tobacco Use  . Smoking status: Current Every Day Smoker  Packs/day: 0.50    Types: Cigarettes    Last attempt to quit: 07/03/2007    Years since quitting: 13.4  . Smokeless tobacco: Never Used  Substance Use Topics  . Alcohol use: Yes    Alcohol/week: 3.0 standard drinks    Types: 3 Cans of beer per week    Comment: Recovered alcoholic x 6 years  . Drug use: No    Types: Other-see comments    Comment:  None in about 25 years; But has been "working around [employer]  Medications Prior to Admission medications   Medication Sig Start Date End Date Taking? Authorizing Provider  aspirin EC 81 MG tablet Take 1 tablet (81 mg total) by mouth daily. Swallow whole. 05/31/20   [name], MD  atorvastatin (LIPITOR) 40 MG tablet Take 1 tablet (40 mg total) by mouth daily. 05/31/20   [name], MD    Allergies    Penicillins  Review of Systems   Review of Systems  Unable to perform ROS: Mental status change    Physical Exam Updated Vital Signs BP 138/73   Pulse 97   Temp 99.1 F (37.3 C) (Rectal)   Resp 18   Ht 1.727 m (5\' 8" )   Wt 59 kg   SpO2 91%   BMI 19.77 kg/m   Physical Exam Vitals and nursing note reviewed.  Constitutional:      Comments: Unkempt appearance  HENT:     Head: Normocephalic.     Comments: Contusion with swelling left forehead    Right Ear: External ear normal.     Left Ear: External ear normal.     Nose: Nose normal.     Mouth/Throat:     Mouth: Mucous membranes are moist.     Pharynx: Oropharynx is clear.  Eyes:     Extraocular Movements: Extraocular movements intact.     Pupils: Pupils are equal, round, and reactive to light.  Cardiovascular:     Rate and Rhythm: Normal rate and regular rhythm.     Pulses: Normal pulses.  Pulmonary:     Effort: Pulmonary effort is normal.     Comments: Patient with some decreased breath sounds and diffuse rhonchi Abdominal:     General: Bowel sounds are normal.     Palpations: Abdomen is soft.  Musculoskeletal:        General: Normal range of motion.     Cervical back: Normal range of motion.     Comments: Continue noted on the No obvious deformities noted Appears to have dried stool on lower extremities  Skin:    General: Skin is warm and dry.     Capillary Refill: Capillary refill takes less than 2 seconds.  Neurological:     General: No focal deficit present.     Mental Status: He is alert.     Comments: Patient is not oriented to place but does know month  and year He is not appear to have any lateralized weaknesses moving all 4 extremities without difficulty     ED Results / Procedures / Treatments   Labs (all labs ordered are listed, but only abnormal results are displayed) Labs Reviewed  CBC - Abnormal; Notable for the following components:      Result Value   WBC 12.5 (*)    RBC 4.01 (*)    Hemoglobin 12.0 (*)    HCT 32.7 (*)    MCHC 36.7 (*)    All other components within  normal limits  COMPREHENSIVE METABOLIC PANEL - Abnormal; Notable for the following components:   Sodium 107 (*)    Chloride 71 (*)    CO2 19 (*)    Glucose, Bld 63 (*)    BUN 7 (*)    Calcium 8.3 (*)    Total Protein 6.0 (*)    Albumin 3.3 (*)    AST 88 (*)    ALT 62 (*)    Total Bilirubin 2.2 (*)    Anion gap 17 (*)    All other components within normal limits  SODIUM - Abnormal; Notable for the following components:   Sodium 111 (*)    All other components within normal limits  I-STAT VENOUS BLOOD GAS, ED - Abnormal; Notable for the following components:   pH, Ven 7.491 (*)    pCO2, Ven 29.0 (*)    pO2, Ven 89.0 (*)    Sodium 106 (*)    Calcium, Ion 0.99 (*)    HCT 38.0 (*)    Hemoglobin 12.9 (*)    All other components within normal limits  RESP PANEL BY RT-PCR (FLU A&B, COVID) ARPGX2  CULTURE, BLOOD (ROUTINE X 2)  CULTURE, BLOOD (ROUTINE X 2)  ETHANOL  URINALYSIS, ROUTINE W REFLEX MICROSCOPIC  RAPID URINE DRUG SCREEN, HOSP PERFORMED  SODIUM  OSMOLALITY  OSMOLALITY, URINE  SODIUM, URINE, RANDOM  CK  HEMOGLOBIN A1C  HIV ANTIBODY (ROUTINE TESTING W REFLEX)  CBC  CREATININE, SERUM  PROTIME-INR  APTT  URINALYSIS, ROUTINE W REFLEX MICROSCOPIC  CBC  BASIC METABOLIC PANEL  BLOOD GAS, ARTERIAL  MAGNESIUM  PHOSPHORUS  SODIUM  SODIUM  SODIUM  SODIUM  SODIUM  SODIUM  FOLATE  VITAMIN B12  SODIUM  SODIUM  SODIUM  OSMOLALITY  OSMOLALITY, URINE  SODIUM, URINE, RANDOM  SODIUM    EKG EKG Interpretation  Date/Time:  Thursday  Nov 24 2020 21:31:52 EDT Ventricular Rate:  123 PR Interval:  120 QRS Duration: 98 QT Interval:  354 QTC Calculation: 507 R Axis:   -3 Text Interpretation: Poor quality data, interpretation may be affected Sinus tachycardia LAE, consider biatrial enlargement Minimal ST depression, inferior leads Minimal ST elevation, lateral leads Prolonged QT interval Artifact in lead(s) I II III aVR aVL aVF V1 V2 V3 V4 V5 V6 Confirmed by Margarita Grizzle [phone]) on 11/24/2020 9:47:34 PM   Radiology CT Head Wo Contrast  Result Date: 11/24/2020 CLINICAL DATA:  Mental status change EXAM: CT HEAD WITHOUT CONTRAST TECHNIQUE: Contiguous axial images were obtained from the base of the skull through the vertex without intravenous contrast. COMPARISON:  CT brain 03/09/2015 FINDINGS: Brain: No acute territorial infarction, hemorrhage, or intracranial mass. Mild atrophy. Stable ventricle size. Vascular: No hyperdense vessels.  Carotid vascular calcification Skull: No fracture Sinuses/Orbits: No acute finding. Other: Small left forehead scalp swelling IMPRESSION: 1. No CT evidence for acute intracranial abnormality. 2. Mild atrophy Electronically Signed   By: Jasmine Pang M.D.   On: 11/24/2020 19:23   DG Chest Port 1 View  Result Date: 11/24/2020 CLINICAL DATA:  60 year old male with altered mental status. EXAM: PORTABLE CHEST 1 VIEW COMPARISON:  Chest radiograph dated 04/01/2015 FINDINGS: No focal consolidation, pleural effusion, or pneumothorax. The cardiac silhouette is within limits. No acute osseous pathology. Atherosclerotic calcification of the aorta. IMPRESSION: No active disease. Electronically Signed   By: Elgie Collard M.D.   On: 11/24/2020 19:08    Procedures .Critical Care Performed by: Margarita Grizzle, MD Authorized by: Margarita Grizzle, MD   Critical care provider  statement:    Critical care time (minutes):  65   Critical care was necessary to treat or prevent imminent or life-threatening deterioration of  the following conditions:  Metabolic crisis   Critical care was time spent personally by me on the following activities:  Discussions with consultants, evaluation of patient's response to treatment, examination of patient, ordering and performing treatments and interventions, ordering and review of laboratory studies, ordering and review of radiographic studies, pulse oximetry, re-evaluation of patient's condition, obtaining history from patient or surrogate and review of old charts     Medications Ordered in ED Medications  thiamine 500mg  in normal saline (50ml) IVPB (has no administration in time range)  multivitamin liquid 15 mL (has no administration in time range)  dexmedetomidine (PRECEDEX) 400 MCG/100ML (4 mcg/mL) infusion (has no administration in time range)  LORazepam (ATIVAN) injection 1-2 mg (has no administration in time range)  insulin aspart (novoLOG) injection 0-6 Units (0 Units Subcutaneous Not Given 11/24/20 2114)  docusate sodium (COLACE) capsule 100 mg (has no administration in time range)  polyethylene glycol (MIRALAX / GLYCOLAX) packet 17 g (has no administration in time range)  enoxaparin (LOVENOX) injection 40 mg (has no administration in time range)  sodium chloride (hypertonic) 3 % solution (has no administration in time range)  sodium chloride 0.9 % bolus 1,000 mL (0 mLs Intravenous Stopped 11/24/20 2045)  LORazepam (ATIVAN) injection 0.5 mg (0.5 mg Intravenous Given 11/24/20 2047)  dextrose 50 % solution 50 mL (50 mLs Intravenous Given 11/24/20 2102)    ED Course  I have reviewed the triage vital signs and the nursing notes.  Pertinent labs & imaging results that were available during my care of the patient were reviewed by me and considered in my medical decision making (see chart for details).    MDM Rules/Calculators/A&P                          60 year old man history of polysubstance abuse and homelessness comes in today with increased confusion.  On  evaluation here his sodium is 106. Hypertonic saline ordered per severe hyponatremia for acute on chronic hyponatremia.  Last sodium here was 138 on 11/10/17.  There is mention on multiple records of etoh abuse, but no elevated etoh levels documented.  Today patient is unable to tell me any significant history. AMS- likely with  component of hyponatremia-hypertonic saline ordered per hyponatremia order set, calculated TBW based on ideal bw of 70 kg , head injury but no acute abnormality on head ct. ETOH today <10- consider etoh withdrawal, plan ativan. Borderline hypoglycemia-bs 63 will give some iv glucose and will need to be followed. Discussed with intensivist and agrees with above They will see for admission   Final Clinical Impression(s) / ED Diagnoses Final diagnoses:  Hyponatremia    Rx / DC Orders ED Discharge Orders    None       Margarita Grizzleay, Locklyn Henriquez, MD 11/24/20 2104    Margarita Grizzleay, Kevonte Vanecek, MD 11/24/20 2147

## 2020-11-25 DIAGNOSIS — E871 Hypo-osmolality and hyponatremia: Secondary | ICD-10-CM | POA: Diagnosis not present

## 2020-11-25 LAB — GLUCOSE, CAPILLARY
Glucose-Capillary: 101 mg/dL — ABNORMAL HIGH (ref 70–99)
Glucose-Capillary: 105 mg/dL — ABNORMAL HIGH (ref 70–99)
Glucose-Capillary: 159 mg/dL — ABNORMAL HIGH (ref 70–99)
Glucose-Capillary: 187 mg/dL — ABNORMAL HIGH (ref 70–99)
Glucose-Capillary: 67 mg/dL — ABNORMAL LOW (ref 70–99)
Glucose-Capillary: 71 mg/dL (ref 70–99)
Glucose-Capillary: 92 mg/dL (ref 70–99)
Glucose-Capillary: 98 mg/dL (ref 70–99)

## 2020-11-25 LAB — HEMOGLOBIN A1C
Hgb A1c MFr Bld: 5.6 % (ref 4.8–5.6)
Mean Plasma Glucose: 114 mg/dL

## 2020-11-25 LAB — CBC
HCT: 30.8 % — ABNORMAL LOW (ref 39.0–52.0)
Hemoglobin: 11.4 g/dL — ABNORMAL LOW (ref 13.0–17.0)
MCH: 30.2 pg (ref 26.0–34.0)
MCHC: 37 g/dL — ABNORMAL HIGH (ref 30.0–36.0)
MCV: 81.5 fL (ref 80.0–100.0)
Platelets: 316 10*3/uL (ref 150–400)
RBC: 3.78 MIL/uL — ABNORMAL LOW (ref 4.22–5.81)
RDW: 14.2 % (ref 11.5–15.5)
WBC: 10.4 10*3/uL (ref 4.0–10.5)
nRBC: 0 % (ref 0.0–0.2)

## 2020-11-25 LAB — MRSA PCR SCREENING: MRSA by PCR: NEGATIVE

## 2020-11-25 LAB — URINALYSIS, ROUTINE W REFLEX MICROSCOPIC
Bilirubin Urine: NEGATIVE
Glucose, UA: 150 mg/dL — AB
Ketones, ur: 20 mg/dL — AB
Nitrite: POSITIVE — AB
Protein, ur: NEGATIVE mg/dL
Specific Gravity, Urine: 1.004 — ABNORMAL LOW (ref 1.005–1.030)
pH: 7 (ref 5.0–8.0)

## 2020-11-25 LAB — POCT I-STAT 7, (LYTES, BLD GAS, ICA,H+H)
Acid-base deficit: 4 mmol/L — ABNORMAL HIGH (ref 0.0–2.0)
Bicarbonate: 21.1 mmol/L (ref 20.0–28.0)
Calcium, Ion: 1.08 mmol/L — ABNORMAL LOW (ref 1.15–1.40)
HCT: 36 % — ABNORMAL LOW (ref 39.0–52.0)
Hemoglobin: 12.2 g/dL — ABNORMAL LOW (ref 13.0–17.0)
O2 Saturation: 90 %
Patient temperature: 97.7
Potassium: 3.6 mmol/L (ref 3.5–5.1)
Sodium: 113 mmol/L — CL (ref 135–145)
TCO2: 22 mmol/L (ref 22–32)
pCO2 arterial: 38.3 mmHg (ref 32.0–48.0)
pH, Arterial: 7.347 — ABNORMAL LOW (ref 7.350–7.450)
pO2, Arterial: 60 mmHg — ABNORMAL LOW (ref 83.0–108.0)

## 2020-11-25 LAB — BASIC METABOLIC PANEL
Anion gap: 12 (ref 5–15)
BUN: 6 mg/dL — ABNORMAL LOW (ref 8–23)
CO2: 20 mmol/L — ABNORMAL LOW (ref 22–32)
Calcium: 7.8 mg/dL — ABNORMAL LOW (ref 8.9–10.3)
Chloride: 82 mmol/L — ABNORMAL LOW (ref 98–111)
Creatinine, Ser: 0.76 mg/dL (ref 0.61–1.24)
GFR, Estimated: >60 mL/min (ref >60)
Glucose, Bld: 73 mg/dL (ref 70–99)
Potassium: 3.4 mmol/L — ABNORMAL LOW (ref 3.5–5.1)
Sodium: 114 mmol/L — CL (ref 135–145)

## 2020-11-25 LAB — RAPID URINE DRUG SCREEN, HOSP PERFORMED
Amphetamines: NOT DETECTED
Barbiturates: NOT DETECTED
Benzodiazepines: NOT DETECTED
Cocaine: NOT DETECTED
Opiates: NOT DETECTED
Tetrahydrocannabinol: NOT DETECTED

## 2020-11-25 LAB — HIV ANTIBODY (ROUTINE TESTING W REFLEX): HIV Screen 4th Generation wRfx: NONREACTIVE

## 2020-11-25 LAB — SODIUM
Sodium: 115 mmol/L — CL (ref 135–145)
Sodium: 117 mmol/L — CL (ref 135–145)
Sodium: 117 mmol/L — CL (ref 135–145)
Sodium: 119 mmol/L — CL (ref 135–145)
Sodium: 120 mmol/L — ABNORMAL LOW (ref 135–145)
Sodium: 121 mmol/L — ABNORMAL LOW (ref 135–145)
Sodium: 120 mmol/L — ABNORMAL LOW (ref 135–145)

## 2020-11-25 LAB — OSMOLALITY
Osmolality: 225 mOsm/kg — CL (ref 275–295)
Osmolality: 231 mOsm/kg — CL (ref 275–295)

## 2020-11-25 LAB — VITAMIN B12: Vitamin B-12: 410 pg/mL (ref 180–914)

## 2020-11-25 LAB — MAGNESIUM
Magnesium: 1.4 mg/dL — ABNORMAL LOW (ref 1.7–2.4)
Magnesium: 2.1 mg/dL (ref 1.7–2.4)

## 2020-11-25 LAB — PHOSPHORUS: Phosphorus: 3.2 mg/dL (ref 2.5–4.6)

## 2020-11-25 LAB — OSMOLALITY, URINE: Osmolality, Ur: 158 mOsm/kg — ABNORMAL LOW (ref 300–900)

## 2020-11-25 LAB — SODIUM, URINE, RANDOM: Sodium, Ur: 12 mmol/L

## 2020-11-25 MED ORDER — MAGNESIUM SULFATE 2 GM/50ML IV SOLN
2.0000 g | Freq: Once | INTRAVENOUS | Status: AC
  Administered 2020-11-25: 2 g via INTRAVENOUS
  Filled 2020-11-25: qty 50

## 2020-11-25 MED ORDER — DEXTROSE 50 % IV SOLN: 12.5000 g | INTRAVENOUS | Status: AC

## 2020-11-25 MED ORDER — ADULT MULTIVITAMIN W/MINERALS CH
1.0000 | ORAL_TABLET | Freq: Every day | ORAL | Status: DC
  Administered 2020-11-26 – 2020-11-30 (×5): 1 via ORAL
  Filled 2020-11-25 (×5): qty 1

## 2020-11-25 MED ORDER — POTASSIUM CHLORIDE 10 MEQ/100ML IV SOLN
10.0000 meq | INTRAVENOUS | Status: AC
  Administered 2020-11-25 (×4): 10 meq via INTRAVENOUS
  Filled 2020-11-25 (×4): qty 100

## 2020-11-25 MED ORDER — LORAZEPAM 1 MG PO TABS: 1.0000 mg | ORAL_TABLET | ORAL | Status: DC | PRN

## 2020-11-25 MED ORDER — FOLIC ACID 1 MG PO TABS
1.0000 mg | ORAL_TABLET | Freq: Every day | ORAL | Status: DC
Start: 2020-11-25 — End: 2020-11-25

## 2020-11-25 MED ORDER — FOLIC ACID 1 MG PO TABS
1.0000 mg | ORAL_TABLET | Freq: Every day | ORAL | Status: DC
  Administered 2020-11-25 – 2020-11-30 (×6): 1 mg via ORAL
  Filled 2020-11-25 (×6): qty 1

## 2020-11-25 MED ORDER — THIAMINE HCL 100 MG PO TABS
100.0000 mg | ORAL_TABLET | Freq: Every day | ORAL | Status: DC
  Administered 2020-11-26 – 2020-11-30 (×5): 100 mg via ORAL
  Filled 2020-11-25 (×5): qty 1

## 2020-11-25 MED ORDER — DEXTROSE 10 % IV SOLN: INTRAVENOUS | Status: DC

## 2020-11-25 MED ORDER — DEXTROSE 5 % IV SOLN: INTRAVENOUS | Status: DC

## 2020-11-25 MED ORDER — SODIUM CHLORIDE 0.9 % IV BOLUS
500.0000 mL | Freq: Once | INTRAVENOUS | Status: AC
  Administered 2020-11-25: 500 mL via INTRAVENOUS

## 2020-11-25 MED ORDER — LORAZEPAM 2 MG/ML IJ SOLN
1.0000 mg | INTRAMUSCULAR | Status: DC | PRN
Start: 2020-11-25 — End: 2020-11-28
  Administered 2020-11-27 – 2020-11-28 (×3): 2 mg via INTRAVENOUS
  Filled 2020-11-25 (×4): qty 1

## 2020-11-25 MED ORDER — DEXTROSE 50 % IV SOLN
INTRAVENOUS | Status: AC
  Administered 2020-11-25: 12.5 g via INTRAVENOUS
  Filled 2020-11-25: qty 50

## 2020-11-25 MED ORDER — CHLORHEXIDINE GLUCONATE CLOTH 2 % EX PADS
6.0000 | MEDICATED_PAD | Freq: Every day | CUTANEOUS | Status: DC
  Administered 2020-11-24 – 2020-11-25 (×2): 6 via TOPICAL

## 2020-11-25 MED ORDER — DEXTROSE-NACL 5-0.9 % IV SOLN: INTRAVENOUS | Status: DC

## 2020-11-25 NOTE — Progress Notes (Signed)
Progress note:  Hyponatremia likely 2/2 alcohol potomania: Na this afternoon is 120.  At goal for today. (after bolus and off of maintenance fluid).   Plan:  -Na is @ goal for today off of IVF -Encourage PO intake -Checking Serum Na q4 h. -Checking Mg @ 1800 today -Also switched high dose Thimine to 100 mg QD -Transfer to progressive care from icu -Hospitalist team are informed and will resume care 5/28. Will appreciate their care.  Lum Babe, MD IM PGY-3

## 2020-11-25 NOTE — Evaluation (Signed)
Clinical/Bedside Swallow Evaluation Patient Details  Name: Scott Berry MRN: [id number] Date of Birth: Jun 15, 1961  Today's Date: 11/25/2020 Time: SLP Start Time (ACUTE ONLY): 1100 SLP Stop Time (ACUTE ONLY): 1117 SLP Time Calculation (min) (ACUTE ONLY): 17.32 min  Past Medical History:  Past Medical History:  Diagnosis Date  . Coronary artery arteriosclerosis 2011   cath with long tubular heavily calcified mid LAD 60%, calcified RCA with multiple 50% stenosis, ostial left circ stenosis of 30% and calcified LM with no stenosis on medical management  . Hypertension   . 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 Personal history of colonic polyps 08/15/2011  . Polysubstance abuse in past 08/15/2011  . Pure hypercholesterolemia   . Shortness of breath    if don't take medicines  . Sleep apnea 1995   not a problem now  . Tobacco abuse 08/15/2011   Past Surgical History:  Past Surgical History:  Procedure Laterality Date  . CARDIAC CATHETERIZATION  09/10/2009   Results in chart  . COLON SURGERY  14Jun2012   colectomy/diverting ileostomy - colon perf  . CYSTOSCOPY N/A 11/10/2017   Procedure: CYSTOSCOPY/REMOVAL OF FOREIGN BODY FROM URETHRA/CYSTOGRAM;  Surgeon: Heloise Purpura, MD;  Location: [facility];  Service: Urology;  Laterality: N/A;  . FOREIGN BODY REMOVAL RECTAL  Oct 2011  . ILEO LOOP DIVERSION  06/21/2011   Procedure: Takedown of loop ileostomy  . LAPAROTOMY N/A 11/10/2017   Procedure: EXPLORATORY LAPAROTOMY;  Surgeon: Heloise Purpura, MD;  Location: [facility];  Service: Urology;  Laterality: N/A;  . LEFT HEART CATHETERIZATION WITH CORONARY ANGIOGRAM N/A 05/05/2014   Procedure: LEFT HEART CATHETERIZATION WITH CORONARY ANGIOGRAM;  Surgeon: Micheline Chapman, MD;  Location: [facility] CATH LAB;  Service: Cardiovascular;  Laterality: N/A;  . Loop ileostomy takedown  20Dec2012   Procedure: Takedown of loop ileostomy  . PERCUTANEOUS CORONARY STENT INTERVENTION  (PCI-S) Right 05/05/2014   Procedure: PERCUTANEOUS CORONARY STENT INTERVENTION (PCI-S);  Surgeon: Micheline Chapman, MD;  Location: [facility] CATH LAB;  Service: Cardiovascular;  Laterality: Right;  Prox RCA   HPI:  Pt is a 61 year old male who presented with altered mental status and hyponatremia after a fall. Pt on CIWA protocol. CXR and CT head negative. PMHx significant for HTN, HLD, CAD (NSTEMI s/p PCI to proximal RCA 2015), OSA, polysubstance abuse, alcoholism colon perforation (s/p sigmoid colectomy/diverting ileostomy with subsequent takedown)   Assessment / Plan / Recommendation Clinical Impression  Pt was seen for bedside swallow evaluation and he denied a history of dysphagia. Pt's nurse, Nate, confirmed that the pt was NPO due to mentation and pending the swallow evaluation; not for procedure. Oral mechanism exam was limited due to pt's difficulty following some commands; however, oral motor strength and ROM appeared grossly WFL. Dentition was limited to three mandibular teeth only, but pt denied any difficulty with mastication. Pt was alert at the beginning of the evaluation, but this was variable and waned as the evaluation progressed. He tolerated all solids and liquids without signs or symptoms of oropharyngeal dysphagia. However, pt was noted to intermittently fall asleep during/soon after mastication. A dysphagia 2 diet with thin liquids will be initiated at this time. SLP will follow to assess diet tolerance and for advancement as clinically indicated with any improvement in mentation. SLP Visit Diagnosis: Dysphagia, unspecified (R13.10)    Aspiration Risk  Mild aspiration risk    Diet Recommendation Dysphagia 2 (Fine chop);Thin liquid   Liquid Administration via: Cup;Straw Medication Administration: Crushed  with puree Supervision: Intermittent supervision to cue for compensatory strategies Compensations: Slow rate;Small sips/bites;Minimize environmental distractions    Other   Recommendations Oral Care Recommendations: Oral care BID   Follow up Recommendations  (TBD)      Frequency and Duration min 2x/week  1 week       Prognosis Prognosis for Safe Diet Advancement: Good Barriers to Reach Goals: Cognitive deficits      Swallow Study   General Date of Onset: 11/24/20 HPI: Pt is a 60 year old male who presented with altered mental status and hyponatremia after a fall. Pt on CIWA protocol. CXR and CT head negative. PMHx significant for HTN, HLD, CAD (NSTEMI s/p PCI to proximal RCA 2015), OSA, polysubstance abuse, alcoholism colon perforation (s/p sigmoid colectomy/diverting ileostomy with subsequent takedown) Type of Study: Bedside Swallow Evaluation Previous Swallow Assessment: none Diet Prior to this Study: NPO Temperature Spikes Noted: No Respiratory Status: Room air History of Recent Intubation: No Behavior/Cognition: Alert;Cooperative;Pleasant mood Oral Cavity Assessment: Within Functional Limits Oral Care Completed by SLP: Recent completion by staff Oral Cavity - Dentition: Missing dentition;Poor condition (three mandibular teeth only) Vision: Functional for self-feeding Self-Feeding Abilities: Able to feed self Patient Positioning: Upright in bed;Postural control adequate for testing Baseline Vocal Quality: Normal Volitional Cough: Cognitively unable to elicit Volitional Swallow: Able to elicit    Oral/Motor/Sensory Function Overall Oral Motor/Sensory Function: Within functional limits   Ice Chips Ice chips: Within functional limits Presentation: Spoon   Thin Liquid Thin Liquid: Within functional limits Presentation: Straw;Cup    Nectar Thick Nectar Thick Liquid: Not tested   Honey Thick Honey Thick Liquid: Not tested   Puree Puree: Within functional limits Presentation: Spoon   Solid     Solid: Within functional limits Presentation: Self Fed     Omero Kowal I. Vear Clock, MS, CCC-SLP Acute Rehabilitation Services Office number  [phone] Pager [phone]  Scheryl Marten 11/25/2020,11:30 AM

## 2020-11-25 NOTE — Progress Notes (Signed)
eLink Physician-Brief Progress Note Patient Name: Scott Berry DOB: 21-Apr-1961 MRN: [id number]   Date of Service  11/25/2020  HPI/Events of Note  Concern that patient is desaturating episodically.  eICU Interventions  Stat ABG ordered.        Thomasene Lot Dannel Rafter 11/25/2020, 1:07 AM

## 2020-11-25 NOTE — Progress Notes (Signed)
[facility] ADULT ICU REPLACEMENT PROTOCOL   The patient does apply for the [facility] Adult ICU Electrolyte Replacment Protocol based on the criteria listed below:   1. Is GFR >/= 30 ml/min? Yes.    Patient's GFR today is >60 2. Is SCr </= 2? Yes.   Patient's SCr is 0.76 ml/kg/hr 3. Did SCr increase >/= 0.5 in 24 hours? No. 4. Abnormal electrolyte(s):  K 3.4 5. Ordered repletion with: protocol 6. If a panic level lab has been reported, has the CCM MD in charge been notified? Yes.  .   Physician:  Shawn Stall R Jemarcus Dougal 11/25/2020 1:57 AM

## 2020-11-25 NOTE — Progress Notes (Addendum)
NAME:  Scott Berry MRN:  [id number] DOB:  [birth year] LOS: 1 ADMISSION DATE:  11/24/2020 CONSULTATION DATE:  11/24/2020 REFERRING MD:  Margarita Grizzle CHIEF COMPLAINT:  Hyponatremia   History of Present Illness:  60 year old man with PMHx significant for HTN, HLD, CAD (NSTEMI s/p PCI to proximal RCA 2015), OSA, polysubstance abuse, colon perforation (s/p sigmoid colectomy/diverting ileostomy with subsequent takedown) presenting with altered mental status and hyponatremia after a fall.  He had sever hyponatremia :106 and 107 in ED. LFT mildly elevated. Also pH is 7.49 with PCO2 of 29.  Patient showing signs of Cheyne-Stokes breathing on exam. Admitted for profound hyponatremia, AMS.   Pertinent Medical History:   Past Medical History:  Diagnosis Date  . Coronary artery arteriosclerosis 2011   cath with long tubular heavily calcified mid LAD 60%, calcified RCA with multiple 50% stenosis, ostial left circ stenosis of 30% and calcified LM with no stenosis on medical management  . Hypertension   . 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 Personal history of colonic polyps 08/15/2011  . Polysubstance abuse in past 08/15/2011  . Pure hypercholesterolemia   . Shortness of breath    if don't take medicines  . Sleep apnea 1995   not a problem now  . Tobacco abuse 08/15/2011   Significant Hospital Events: Including procedures, antibiotic start and stop dates in addition to other pertinent events   . 5/26 - Presented to [facility] with AMS, Na 106  Interim History / Subjective:  He is doing well but states that he is very tired. BG 67 overnight. Was given D 50%. Patient desaturated briefly but it then resolved.  Objective:  Blood pressure 139/66, pulse 96, temperature 97.7 F (36.5 C), temperature source Oral, resp. rate 15, height 5\' 3"  (1.6 m), weight 50.3 kg, SpO2 92 %.        Intake/Output Summary (Last 24 hours) at 11/25/2020 0736 Last data filed at 11/25/2020  0504 Gross per 24 hour  Intake 433.11 ml  Output 600 ml  Net -166.89 ml   Filed Weights   11/24/20 1814 11/24/20 2300 11/25/20 0500  Weight: 59 kg 51.4 kg 50.3 kg   Physical Examination:  Constitutional: Ill appearing. No acute distress.  HENT:  Head: Swelling and abrasion on forahead Eyes: Conjunctivae are normal, EOM n, pupils are reactivel Cardiovascular: RRR, nl S1S2, no murmur, no LEE Respiratory: Effort normal and breath sounds normal. No respiratory distress. No wheezes.  GI: Soft. Bowel sounds are normal. No distension. There is no tenderness.  Neurological: Is drowzy and oriented to place, month and year and people Extremities: dry small wounds on arms Skin: Not diaphoretic. No erythema.   Labs/imaging that I have personally reviewed: (right click and "Reselect all SmartList Selections" daily)  WBC 10.4, Hb 12.2, Na 117, Mg last night 1.4, Phos 3.2 Resolved Hospital Problem List:   Na Assessment & Plan:  This is a 60 year old male with history as noted above who presents for altered mental status.  Found to have sever hyponatremia.  Sever hypotonic hyponatremia Na 106 on arrival. Liklly alcohol potomania, SIADH AMS but no seizure reported  Serum osm low at 225-231 Urine Na 12 Urine osm 158 (low) -Started on hypertonic saline 5/26. Today Na 117 (increased 10 in about 12h). Stopped and then started on D5W -Repeated Na this AM 117. Will DC D5  -F/u next Na and will give bolus NaCl if needed -Goal Na mid 120 at 7 pm today. -  Swallow eval and will start diet if passes that -Sodium level every 4 hours -Monitor for seizures.  Neurochecks every 1 hour -Mg 1.4 last night. S/p 2 gr Mg sulfate. Will give another 2 gr -Check Phos and Mg daily. Monitor for refeeding syndrome when start diet  ADDENDUM: Na 119. Will bolus with 500 ml NS and f/u Na at 13:30  Alcoholism history of.  Patient does endorse that he drinks daily unsure of how much. Normocytic anemia  EtOH<10 on  arrival.  -CIWA protocol w PRN Ativan -High-dose thiamine -Multivitamin -CMP with mag Foss in the a.m. -Folate and B12 levels were nl: Folate 8.9, Vit B12: 410 -CK mildly up to 664. Received some IV fluid. No need to monitor CK  Transaminitis-likely related to alcohol ingestion chronically.  Also accompanied by elevated total bili.  Albumin is marginal. -CMP daily -Needs PT INR -Consider RUQ ultrasound in future to assess for potential cirrhosis  Homelessness- Unknown recent medical history. Uknown contacts and next of kin Concern for etoh abuse -Will need to discuss with case management in AM. Full code for now  Abnormal UA: + for nitrate, Hb small, WBC 11-30. No fever and no leukocytosis. When more alert, will get Hx if he has urinary symptoms to treat as UTI.   Best Practice: (right click and "Reselect all SmartList Selections" daily)  Diet:  Npo now. Start diet if passes swallow eval Pain/Anxiety/Delirium protocol (if indicated): Ativan PRN VAP protocol (if indicated): NA DVT prophylaxis: Lovenox GI prophylaxis: NA Glucose control:  Sugars Q 4 hours Central venous access:  Not indicated for now Arterial line: NA Foley: NA Mobility:  TBD PT consulted: Yes Last date of multidisciplinary goals of care discussion Attmpted to call any famiyl. Contacts in chart not answering. No one answering home phone. Patient homeless so it may be difficult to contact surrogate decision maker Code Status:  Full code for now Disposition: ICU for now   Labs   CBC: Recent Labs  Lab 11/24/20 1839 11/24/20 1922 11/24/20 2158 11/25/20 0038 11/25/20 0136  WBC 12.5*  --  11.4* 10.4  --   HGB 12.0* 12.9* 11.5* 11.4* 12.2*  HCT 32.7* 38.0* 31.0* 30.8* 36.0*  MCV 81.5  --  81.6 81.5  --   PLT 362  --  360 316  --     Basic Metabolic Panel: Recent Labs  Lab 11/24/20 1839 11/24/20 1922 11/24/20 2045 11/24/20 2158 11/25/20 0038 11/25/20 0136 11/25/20 0221 11/25/20 0541  11/25/20 0713  NA 107* 106*   < > 111* 114* 113* 115* 117* 117*  K 4.1 4.1  --   --  3.4* 3.6  --   --   --   CL 71*  --   --   --  82*  --   --   --   --   CO2 19*  --   --   --  20*  --   --   --   --   GLUCOSE 63*  --   --   --  73  --   --   --   --   BUN 7*  --   --   --  6*  --   --   --   --   CREATININE 0.86  --   --  0.84 0.76  --   --   --   --   CALCIUM 8.3*  --   --   --  7.8*  --   --   --   --  MG  --   --   --   --  1.4*  --   --   --   --   PHOS  --   --   --   --  3.2  --   --   --   --    < > = values in this interval not displayed.   GFR: Estimated Creatinine Clearance: 68.1 mL/min (by C-G formula based on SCr of 0.76 mg/dL). Recent Labs  Lab 11/24/20 1839 11/24/20 2158 11/25/20 0038  WBC 12.5* 11.4* 10.4    Liver Function Tests: Recent Labs  Lab 11/24/20 1839  AST 88*  ALT 62*  ALKPHOS 74  BILITOT 2.2*  PROT 6.0*  ALBUMIN 3.3*   No results for input(s): LIPASE, AMYLASE in the last 168 hours. No results for input(s): AMMONIA in the last 168 hours.  ABG    Component Value Date/Time   PHART 7.347 (L) 11/25/2020 0136   PCO2ART 38.3 11/25/2020 0136   PO2ART 60 (L) 11/25/2020 0136   HCO3 21.1 11/25/2020 0136   TCO2 22 11/25/2020 0136   ACIDBASEDEF 4.0 (H) 11/25/2020 0136   O2SAT 90.0 11/25/2020 0136     Coagulation Profile: Recent Labs  Lab 11/24/20 2158  INR 1.0    Cardiac Enzymes: Recent Labs  Lab 11/24/20 2158  CKTOTAL 664*    HbA1C: No results found for: HGBA1C  CBG: Recent Labs  Lab 11/24/20 2308 11/25/20 0153 11/25/20 0338 11/25/20 0419 11/25/20 0802  GLUCAP 88 71 67* 159* 92    Review of Systems:   Not able to obtain given AMS  Past Medical History:  He,  has a past medical history of Coronary artery arteriosclerosis (2011), Hypertension, Perforation of colon - s/p Sigmoid colectomy / diverting ileostomy (01/30/2011), Personal history of colonic polyps (08/15/2011), Polysubstance abuse in past (08/15/2011), Pure  hypercholesterolemia, Shortness of breath, Sleep apnea (1995), and Tobacco abuse (08/15/2011).   Surgical History:   Past Surgical History:  Procedure Laterality Date  . CARDIAC CATHETERIZATION  09/10/2009   Results in chart  . COLON SURGERY  14Jun2012   colectomy/diverting ileostomy - colon perf  . CYSTOSCOPY N/A 11/10/2017   Procedure: CYSTOSCOPY/REMOVAL OF FOREIGN BODY FROM URETHRA/CYSTOGRAM;  Surgeon: Heloise Purpura, MD;  Location: [facility];  Service: Urology;  Laterality: N/A;  . FOREIGN BODY REMOVAL RECTAL  Oct 2011  . ILEO LOOP DIVERSION  06/21/2011   Procedure: Takedown of loop ileostomy  . LAPAROTOMY N/A 11/10/2017   Procedure: EXPLORATORY LAPAROTOMY;  Surgeon: Heloise Purpura, MD;  Location: [facility];  Service: Urology;  Laterality: N/A;  . LEFT HEART CATHETERIZATION WITH CORONARY ANGIOGRAM N/A 05/05/2014   Procedure: LEFT HEART CATHETERIZATION WITH CORONARY ANGIOGRAM;  Surgeon: Micheline Chapman, MD;  Location: [facility] CATH LAB;  Service: Cardiovascular;  Laterality: N/A;  . Loop ileostomy takedown  20Dec2012   Procedure: Takedown of loop ileostomy  . PERCUTANEOUS CORONARY STENT INTERVENTION (PCI-S) Right 05/05/2014   Procedure: PERCUTANEOUS CORONARY STENT INTERVENTION (PCI-S);  Surgeon: Micheline Chapman, MD;  Location: [facility] CATH LAB;  Service: Cardiovascular;  Laterality: Right;  Prox RCA     Social History:   reports that he has been smoking cigarettes. He has been smoking about 0.50 packs per day. He has never used smokeless tobacco. He reports current alcohol use of about 3.0 standard drinks of alcohol per week. He reports that he does not use drugs.   Family History:  His family history includes Pancreatic disease in his father; Stroke in his  father and mother.   Allergies Allergies  Allergen Reactions  . Penicillins Anaphylaxis    Has patient had a PCN reaction causing immediate rash, facial/tongue/throat swelling, SOB or lightheadedness with hypotension: Yes Has patient had a PCN  reaction causing severe rash involving mucus membranes or skin necrosis:No Has patient had a PCN reaction that required hospitalization No Has patient had a PCN reaction occurring within the last 10 years: No If all of the above answers are "NO", then may proceed with Cephalosporin use.      Home Medications  Prior to Admission medications   Medication Sig Start Date End Date Taking? Authorizing Provider  aspirin EC 81 MG tablet Take 1 tablet (81 mg total) by mouth daily. Swallow whole. 05/31/20   [name], MD  atorvastatin (LIPITOR) 40 MG tablet Take 1 tablet (40 mg total) by mouth daily. 05/31/20   [name], MD  lisinopril-hydrochlorothiazide (ZESTORETIC) 20-12.5 MG tablet Take 1 tablet by mouth daily. 10/25/20   [provider]     Critical care time:      Chevis Pretty, MD [id number] 11/25/2020, 11:02 AM Pager: [phone]

## 2020-11-25 NOTE — Progress Notes (Signed)
Hypoglycemic Event  CBG:  67    Treatment: D50 25 mL (12.5 gm)  Symptoms: None  Follow-up CBG: Time:0419 CBG Result: 159  Possible Reasons for Event: Unknown  Comments/MD notified:eLINK    Esperanza Sheets

## 2020-11-25 NOTE — Progress Notes (Addendum)
eLink Physician-Brief Progress Note Patient Name: Scott Berry DOB: [birth year] MRN: [id number]   Date of Service  11/25/2020  HPI/Events of Note  Blood sugar 71 mg / dl, patient is NPO.  eICU Interventions  D 10 % water started at 10 ml / hour x 12 hours.Scott Berry 11/25/2020, 2:23 AM

## 2020-11-26 DIAGNOSIS — E871 Hypo-osmolality and hyponatremia: Secondary | ICD-10-CM | POA: Diagnosis not present

## 2020-11-26 DIAGNOSIS — F1020 Alcohol dependence, uncomplicated: Secondary | ICD-10-CM | POA: Diagnosis present

## 2020-11-26 LAB — COMPREHENSIVE METABOLIC PANEL
ALT: 42 U/L (ref 0–44)
AST: 52 U/L — ABNORMAL HIGH (ref 15–41)
Albumin: 2.7 g/dL — ABNORMAL LOW (ref 3.5–5.0)
Alkaline Phosphatase: 51 U/L (ref 38–126)
Anion gap: 10 (ref 5–15)
BUN: 6 mg/dL — ABNORMAL LOW (ref 8–23)
CO2: 20 mmol/L — ABNORMAL LOW (ref 22–32)
Calcium: 7.9 mg/dL — ABNORMAL LOW (ref 8.9–10.3)
Chloride: 91 mmol/L — ABNORMAL LOW (ref 98–111)
Creatinine, Ser: 0.7 mg/dL (ref 0.61–1.24)
GFR, Estimated: >60 mL/min (ref >60)
Glucose, Bld: 88 mg/dL (ref 70–99)
Potassium: 3.4 mmol/L — ABNORMAL LOW (ref 3.5–5.1)
Sodium: 121 mmol/L — ABNORMAL LOW (ref 135–145)
Total Bilirubin: 0.9 mg/dL (ref 0.3–1.2)
Total Protein: 5.2 g/dL — ABNORMAL LOW (ref 6.5–8.1)

## 2020-11-26 LAB — GLUCOSE, CAPILLARY
Glucose-Capillary: 81 mg/dL (ref 70–99)
Glucose-Capillary: 130 mg/dL — ABNORMAL HIGH (ref 70–99)
Glucose-Capillary: 155 mg/dL — ABNORMAL HIGH (ref 70–99)
Glucose-Capillary: 68 mg/dL — ABNORMAL LOW (ref 70–99)
Glucose-Capillary: 228 mg/dL — ABNORMAL HIGH (ref 70–99)

## 2020-11-26 LAB — SODIUM
Sodium: 122 mmol/L — ABNORMAL LOW (ref 135–145)
Sodium: 124 mmol/L — ABNORMAL LOW (ref 135–145)
Sodium: 125 mmol/L — ABNORMAL LOW (ref 135–145)

## 2020-11-26 LAB — MAGNESIUM: Magnesium: 2 mg/dL (ref 1.7–2.4)

## 2020-11-26 MED ORDER — POTASSIUM CHLORIDE CRYS ER 20 MEQ PO TBCR
40.0000 meq | EXTENDED_RELEASE_TABLET | Freq: Once | ORAL | Status: AC
  Administered 2020-11-26: 40 meq via ORAL
  Filled 2020-11-26: qty 2

## 2020-11-26 MED ORDER — DEXTROSE 50 % IV SOLN
INTRAVENOUS | Status: AC
  Administered 2020-11-26: 50 mL
  Filled 2020-11-26: qty 50

## 2020-11-26 MED ORDER — DEXTROSE 50 % IV SOLN: 50.0000 mL | Freq: Once | INTRAVENOUS | Status: AC

## 2020-11-26 NOTE — Progress Notes (Signed)
More awake now. Tolerated 50 % of dinner tray with assist. Increased agitation and attempting oob. Ativan 2 mg IV given. Sats on RA = 89 - 90%. O2 placed at 2 L via [place]. Sats = 97%.

## 2020-11-26 NOTE — Progress Notes (Addendum)
CBG-68 Gave D50

## 2020-11-26 NOTE — Evaluation (Signed)
Occupational Therapy Evaluation Patient Details Name: Scott Berry MRN: [id number] DOB: 1961/03/30 Today's Date: 11/26/2020    History of Present Illness Pt is a 60 year old male who presented with altered mental status and hyponatremia after a fall. Pt on CIWA protocol. CXR and CT head negative. PMHx significant for HTN, HLD, CAD (NSTEMI s/p PCI to proximal RCA 2015), OSA, polysubstance abuse, alcoholism colon perforation (s/p sigmoid colectomy/diverting ileostomy with subsequent takedown)   Clinical Impression   Pt admitted with AMS and post fall. Pt was attempted three times in the day to see level of arousal. Pt on this attempted opened Right eye to look at therapist. Pt was attempting to speak but unable to understand at this time. Pt was able to follow one step commands with movement of limbs to don socks. Pt has in chart a history of homeless. Pt currently with functional limitations due to the deficits listed below (see OT Problem List).  Pt will benefit from skilled OT to increase their safety and independence with ADL and functional mobility for ADL to facilitate discharge to venue listed below.       Follow Up Recommendations  SNF;Supervision/Assistance - 24 hour ( however, if less lethargic and able to attend to activity may need lower level of therapy)    Equipment Recommendations       Recommendations for Other Services       Precautions / Restrictions Precautions Precautions: Fall      Mobility Bed Mobility Overal bed mobility: Needs Assistance Bed Mobility: Rolling Rolling: Min guard         General bed mobility comments: Pt will complete rotation from side to side for comfort it appears    Transfers                      Balance                                           ADL either performed or assessed with clinical judgement   ADL Overall ADL's : Needs assistance/impaired     Grooming: Wash/dry hands;Wash/dry  face;Moderate assistance;Bed level                                 General ADL Comments: at this time pt only would wash face then turned over and stoped movement     Vision         Perception     Praxis      Pertinent Vitals/Pain       Hand Dominance     Extremity/Trunk Assessment Upper Extremity Assessment Upper Extremity Assessment:  (ridigity in movmenent)           Communication Communication Communication:  (Pt was only mumbling in session but unable to understand)   Cognition Arousal/Alertness: Lethargic                                     General Comments: Per Rn pt was agitated prior, attempted three times today to engage pt in activity and prior to medications   General Comments       Exercises     Shoulder Instructions      Home Living Family/patient expects to be discharged to:: Shelter/Homeless  Additional Comments: pt unable to vocalize information at this time but per chart a hx of homeless      Prior Functioning/Environment Level of Independence:  (Pt prior reports of homeless)                 OT Problem List: Decreased strength;Decreased range of motion;Decreased activity tolerance;Decreased safety awareness;Decreased knowledge of use of DME or AE;Decreased cognition;Decreased knowledge of precautions      OT Treatment/Interventions: Self-care/ADL training;Therapeutic exercise;Therapeutic activities;Cognitive remediation/compensation;Patient/family education;Balance training    OT Goals(Current goals can be found in the care plan section) Acute Rehab OT Goals Patient Stated Goal: noe reported OT Goal Formulation: Patient unable to participate in goal setting Time For Goal Achievement: 12/17/20 Potential to Achieve Goals: Fair ADL Goals Pt Will Perform Grooming: with set-up;sitting Pt Will Perform Upper Body Dressing: with set-up;sitting;standing Pt Will  Perform Lower Body Dressing: with set-up;sit to/from stand  OT Frequency: Min 2X/week   Barriers to D/C:            Co-evaluation              AM-PAC OT "6 Clicks" Daily Activity     Outcome Measure Help from another person eating meals?: A Little Help from another person taking care of personal grooming?: A Lot Help from another person toileting, which includes using toliet, bedpan, or urinal?: Total Help from another person bathing (including washing, rinsing, drying)?: Total Help from another person to put on and taking off regular upper body clothing?: Total Help from another person to put on and taking off regular lower body clothing?: Total 6 Click Score: 9   End of Session Nurse Communication: Mobility status;Other (comment) (activity level)  Activity Tolerance: Patient limited by lethargy Patient left: in bed;with bed alarm set  OT Visit Diagnosis: Muscle weakness (generalized) (M62.81)                Time: 5956-3875 OT Time Calculation (min): 21 min Charges:  OT General Charges $OT Visit: 1 Visit OT Evaluation $OT Eval Low Complexity: 1 Low  Alphia Moh OTR/L  Acute Rehab Services  [phone] office number [phone] pager number   Alphia Moh 11/26/2020, 3:00 PM

## 2020-11-26 NOTE — Evaluation (Signed)
Physical Therapy Evaluation Patient Details Name: Scott Berry MRN: [id number] DOB: [birth year] Today's Date: 11/26/2020   History of Present Illness  Pt is a 60 year old male who presented with altered mental status and hyponatremia after a fall. Pt on CIWA protocol. CXR and CT head negative. PMHx significant for HTN, HLD, CAD (NSTEMI s/p PCI to proximal RCA 2015), OSA, polysubstance abuse, alcoholism colon perforation (s/p sigmoid colectomy/diverting ileostomy with subsequent takedown)  Clinical Impression   Pt admitted with above diagnosis. Tells me [place] "on the street"; At this time, it is unknown what, if any services or shelters he uses for sleep and/or ADLs; PLOF is unknown, but he was interacting, albeit erratically which lead to calling EMS, with people outside; Presents to PT with AMS, slowed reaction time, inconsistent communication and following commands, generalized weakness, decr balance with high fall risk; Recommend SNF for post-acute rehab; Pt currently with functional limitations due to the deficits listed below (see PT Problem List). Pt will benefit from skilled PT to increase their independence and safety with mobility to allow discharge to the venue listed below.       Follow Up Recommendations SNF    Equipment Recommendations  Rolling walker with 5" wheels;3in1 (PT);Other (comment) (also can defer to SNF)    Recommendations for Other Services       Precautions / Restrictions Precautions Precautions: Fall      Mobility  Bed Mobility Overal bed mobility: Needs Assistance Bed Mobility: Rolling;Sidelying to Sit;Sit to Supine Rolling: Min guard Sidelying to sit: Max assist   Sit to supine: Max assist   General bed mobility comments: Rolled to side for comfort; Max assist to initiate and follow through with gettin up to EOB    Transfers Overall transfer level: Needs assistance Equipment used: 1 person hand held assist (and close guard of knees) Transfers:  Sit to/from [employer] Sit to Stand: Max assist Stand pivot transfers: Max assist       General transfer comment: Max assist to initiate transfer to stand; knees blocked for stability; once standing, no buckling noted; tending to brace backs of LEs against bed for stability; attempted sidesteps towards [facility], but lots of difficulty weight shifting into stance to allow for steps; Max assist to guide hip towards [facility] in a simulated basic pivot transfer  Ambulation/Gait                Stairs            Wheelchair Mobility    Modified Rankin (Stroke Patients Only)       Balance Overall balance assessment: Needs assistance Sitting-balance support: Feet supported;Bilateral upper extremity supported Sitting balance-Leahy Scale: Fair       Standing balance-Leahy Scale: Zero Standing balance comment: Max assist to suport pt in standing                             Pertinent Vitals/Pain Pain Assessment: No/denies pain    Home Living Family/patient expects to be discharged to:: Shelter/Homeless                 Additional Comments: When asked about where he lives, pt replied (after a considerable amount of time) "on the streets"    Prior Function Level of Independence: Independent         Comments: Did not answer when asked if he uses a cane or walker; Will need more information re: PLOF, or what resources he  uses (if any) for ADLs     Hand Dominance        Extremity/Trunk Assessment   Upper Extremity Assessment Upper Extremity Assessment: Defer to OT evaluation (initially rigid in movement; with some tiem and interaction, able to use hands for manual tasks such as wiping face, and optening billfold)    Lower Extremity Assessment Lower Extremity Assessment: Generalized weakness (very uncoordinated in standing and stepping)    Cervical / Trunk Assessment Cervical / Trunk Assessment: Kyphotic  Communication   Communication:  Expressive difficulties (Answered approx 30% of questions with one word answers with incr time)  Cognition Arousal/Alertness: Lethargic Behavior During Therapy: Flat affect Overall Cognitive Status: Impaired/Different from baseline Area of Impairment: Orientation;Attention;Following commands;Safety/judgement;Problem solving                   Current Attention Level: Sustained   Following Commands: Follows one step commands inconsistently (with increased time) Safety/Judgement: Decreased awareness of safety;Decreased awareness of deficits   Problem Solving: Slow processing;Decreased initiation;Difficulty sequencing;Requires tactile cues General Comments: Noting some goal-directed behaviors including initiating rubbing eyes, and then using warm wet washcloth to get crust off of his eyelashes; At one point, he asked where his billfold was - after handing it to him, he opened it and took cards out and put them back      General Comments General comments (skin integrity, edema, etc.): Session conducted on Room Air; HR ranged 95-122bpm; BP seated 88/64; BP sidelying(back in bed) 116/84    Exercises     Assessment/Plan    PT Assessment Patient needs continued PT services  PT Problem List Decreased strength;Decreased range of motion;Decreased activity tolerance;Decreased balance;Decreased mobility;Decreased coordination;Decreased cognition;Decreased knowledge of use of DME;Decreased safety awareness;Decreased knowledge of precautions;Cardiopulmonary status limiting activity;Pain       PT Treatment Interventions DME instruction;Gait training;Stair training;Functional mobility training;Therapeutic activities;Therapeutic exercise;Balance training;Neuromuscular re-education;Cognitive remediation;Patient/family education    PT Goals (Current goals can be found in the Care Plan section)  Acute Rehab PT Goals Patient Stated Goal: Did not state, but seemed pleased with wiping face and being  able to open both eyes PT Goal Formulation: Patient unable to participate in goal setting Time For Goal Achievement: 12/10/20 Potential to Achieve Goals: Good    Frequency Min 2X/week   Barriers to discharge Decreased caregiver support;Other (comment) experiencing homelessness    Co-evaluation               AM-PAC PT "6 Clicks" Mobility  Outcome Measure Help needed turning from your back to your side while in a flat bed without using bedrails?: A Little Help needed moving from lying on your back to sitting on the side of a flat bed without using bedrails?: A Lot Help needed moving to and from a bed to a chair (including a wheelchair)?: A Lot Help needed standing up from a chair using your arms (e.g., wheelchair or bedside chair)?: A Lot Help needed to walk in hospital room?: Total Help needed climbing 3-5 steps with a railing? : Total 6 Click Score: 11    End of Session Equipment Utilized During Treatment: Gait belt Activity Tolerance: Patient limited by fatigue;Patient limited by lethargy Patient left: in bed;with call bell/phone within reach;with bed alarm set;with nursing/sitter in room Nurse Communication: Mobility status PT Visit Diagnosis: Other abnormalities of gait and mobility (R26.89);Muscle weakness (generalized) (M62.81);Adult, failure to thrive (R62.7)    Time: 0254-2706 PT Time Calculation (min) (ACUTE ONLY): 19 min   Charges:   PT Evaluation $PT Eval  Moderate Complexity: 1 Mod          Scott Berry, [place]  Acute Rehabilitation Services Pager [phone] Office [phone]   Levi Aland 11/26/2020, 5:50 PM

## 2020-11-26 NOTE — Progress Notes (Signed)
PROGRESS NOTE    Scott Berry  ZOX:[id number] DOB: [birth year] DOA: 11/24/2020 PCP: Adrienne Mocha, PA    Brief Narrative:  Scott Berry is a 60 year old homeless male with past medical history significant for essential hypertension, hyperlipidemia, CAD/NSTEMI s/p PCI to proximal RCA 2015, OSA, polysubstance abuse, colon perforation s/p sigmoid colectomy/diverting ileostomy with subsequent takedown who presented to [facility], ED on 5/26 with altered mental status.  Patient apparently hangs around downtown [place] and was found to be acting odd with subsequent fall.  He was very weak in appearance, breathing heavily and confused; and subsequently bystanders called EMS.  In the ED, temperature 99.1 F, BP 138/73, HR 97, RR 18, SPO2 91% on room air.  Sodium 107, potassium 4.1, chloride 71, CO2 19, glucose 63, BUN 7, creatinine 0.86, AST 88, ALT 62, total bilirubin 2.2.  WBC 12.5, hemoglobin 12.0, platelets 362.  EtOH level less than 10.  CT head with no acute intracranial abnormality.  Chest x-ray with no acute cardiopulmonary disease process.  UDS negative.  Patient was initially admitted to the PCCM service due to severe hyponatremia requiring hypertonic saline.  Sodium improved from low 106 to 121.  Patient was transferred from [facility] to [facility] on 5/28.   Assessment & Plan:   Principal Problem:   Hyponatremia Active Problems:   Hypertension   Polysubstance abuse in past   Tobacco abuse   Dyslipidemia, goal LDL below 70   EtOH dependence (HCC)   Acute metabolic encephalopathy, POA Patient presenting to ED after bystanders initiated EMS for confusion, weakness.  CT head, chest x-ray, UDS, EtOH level negative and unrevealing.  Etiology likely secondary to chronic EtOH abuse disorder coupled with severe hyponatremia.  Continue treatment as below.  Severe hyponatremia Patient presenting with a sodium level of 107, in the setting of severe confusion.  CT head with no acute intracranial  abnormality.  Patient was initially admitted to the PCCM service and placed on hypertonic saline with improvement of sodium level.  Urine sodium 12, urine osmolality 158, likely secondary to beer potomania.  Hypertonic saline and IV fluids now discontinued. --Na 107>106>>121>124 --Continue encourage increase oral intake --EtOH cessation --Na level q8h, goal increase of 8-12 mmol/L in 24h period --continue to monitor on telemety  Hypokalemia --Potassium 3.4 this morning, will replete.  Magnesium within normal limits. --Follow electrolytes daily  EtOH dependence EtOH level less than 10 on admission.  Etiology of hyponatremia as above likely secondary to beer potomania and chronic etoh abuse. --CIWAA protocol with symptom triggered Ativan --Folic acid, thiamine, multivitamin --TOC for substance abuse  CAD s/p PCI RCA 2015 Previously prescribed atorvastatin and aspirin, unlikely taking this outpatient.  History of essential hypertension BP 112/58 this morning, well controlled.  Previously prescribed lisinopril/HCTZ, unlikely compliant outpatient. --Continue monitor BP, off antihypertensives  Dysphagia Patient was seen by speech therapy on 5/27.  --Continue dysphagia 2 diet --Aspiration precaution  Severe protein calorie malnutrition Body mass index is 19.68 kg/m.  Patient with significant muscle wasting, fat depletion likely secondary to his caloric reliance on EtOH. -- Dietitian consult  Homelessness --TOC consult  DVT prophylaxis: enoxaparin (LOVENOX) injection 40 mg Start: 11/24/20 2200 SCDs Start: 11/24/20 2127    Code Status: Full Code Family Communication: No family present at bedside this morning  Disposition Plan:  Level of care: Progressive Status is: Inpatient  Remains inpatient appropriate because:Altered mental status, Ongoing diagnostic testing needed not appropriate for outpatient work up, Unsafe d/c plan, IV treatments appropriate due to intensity  of illness  or inability to take PO and Inpatient level of care appropriate due to severity of illness   Dispo: The patient is from: Homeless              Anticipated d/c is to: To be determined              Patient currently is not medically stable to d/c.   Difficult to place patient No   Consultants:   PCCM - signed off 5/28  Procedures:   None  Antimicrobials:   None   Subjective: Patient seen examined bedside, sleeping.  Nonverbal.  Nods head to questions.  No family present.  Unable to obtain any further ROS from patient given his current mental status.  Sodium continues to slowly improve, up to 124 this morning.  No acute concerns this morning per nursing staff.  Objective: Vitals:   11/26/20 0831 11/26/20 0900 11/26/20 1000 11/26/20 1220  BP:  111/69 111/69   Pulse:  82    Resp: 16 (!) 22 (!) 25 16  Temp:  98 F (36.7 C)    TempSrc:  Oral    SpO2:      Weight:      Height:        Intake/Output Summary (Last 24 hours) at 11/26/2020 1223 Last data filed at 11/26/2020 1100 Gross per 24 hour  Intake 200 ml  Output 1300 ml  Net -1100 ml   Filed Weights   11/25/20 0500 11/25/20 2334 11/26/20 0448  Weight: 50.3 kg 50.7 kg 50.4 kg    Examination:  General exam: Appears calm and comfortable, chronically ill in appearance, thin/cachectic with significant muscle wasting and fat depletion Respiratory system: Clear to auscultation. Respiratory effort normal.  On room air Cardiovascular system: S1 & S2 heard, RRR. No JVD, murmurs, rubs, gallops or clicks. No pedal edema. Gastrointestinal system: Abdomen is nondistended, soft and nontender. No organomegaly or masses felt. Normal bowel sounds heard. Central nervous system: Alert, moves extremities independently Extremities: Symmetric 5 x 5 power. Skin: No rashes, lesions or ulcers Psychiatry: Unable to obtain secondary to current mental status    Data Reviewed: I have personally reviewed following labs and imaging  studies  CBC: Recent Labs  Lab 11/24/20 1839 11/24/20 1922 11/24/20 2158 11/25/20 0038 11/25/20 0136  WBC 12.5*  --  11.4* 10.4  --   HGB 12.0* 12.9* 11.5* 11.4* 12.2*  HCT 32.7* 38.0* 31.0* 30.8* 36.0*  MCV 81.5  --  81.6 81.5  --   PLT 362  --  360 316  --    Basic Metabolic Panel: Recent Labs  Lab 11/24/20 1839 11/24/20 1922 11/24/20 2045 11/24/20 2158 11/25/20 0038 11/25/20 0136 11/25/20 0221 11/25/20 1808 11/25/20 2207 11/26/20 0249 11/26/20 0515 11/26/20 1009  NA 107* 106*   < > 111* 114* 113*   < > 121* 120* 122* 121* 124*  K 4.1 4.1  --   --  3.4* 3.6  --   --   --   --  3.4*  --   CL 71*  --   --   --  82*  --   --   --   --   --  91*  --   CO2 19*  --   --   --  20*  --   --   --   --   --  20*  --   GLUCOSE 63*  --   --   --  73  --   --   --   --   --  88  --   BUN 7*  --   --   --  6*  --   --   --   --   --  6*  --   CREATININE 0.86  --   --  0.84 0.76  --   --   --   --   --  0.70  --   CALCIUM 8.3*  --   --   --  7.8*  --   --   --   --   --  7.9*  --   MG  --   --   --   --  1.4*  --   --  2.1  --   --  2.0  --   PHOS  --   --   --   --  3.2  --   --   --   --   --   --   --    < > = values in this interval not displayed.   GFR: Estimated Creatinine Clearance: 68.3 mL/min (by C-G formula based on SCr of 0.7 mg/dL). Liver Function Tests: Recent Labs  Lab 11/24/20 1839 11/26/20 0515  AST 88* 52*  ALT 62* 42  ALKPHOS 74 51  BILITOT 2.2* 0.9  PROT 6.0* 5.2*  ALBUMIN 3.3* 2.7*   No results for input(s): LIPASE, AMYLASE in the last 168 hours. No results for input(s): AMMONIA in the last 168 hours. Coagulation Profile: Recent Labs  Lab 11/24/20 2158  INR 1.0   Cardiac Enzymes: Recent Labs  Lab 11/24/20 2158  CKTOTAL 664*   BNP (last 3 results) No results for input(s): PROBNP in the last 8760 hours. HbA1C: Recent Labs    11/24/20 2158  HGBA1C 5.6   CBG: Recent Labs  Lab 11/25/20 1122 11/25/20 1534 11/25/20 1937  11/25/20 2337 11/26/20 1005  GLUCAP 98 105* 101* 187* 81   Lipid Profile: No results for input(s): CHOL, HDL, LDLCALC, TRIG, CHOLHDL, LDLDIRECT in the last 72 hours. Thyroid Function Tests: No results for input(s): TSH, T4TOTAL, FREET4, T3FREE, THYROIDAB in the last 72 hours. Anemia Panel: Recent Labs    11/24/20 2158  VITAMINB12 410  FOLATE 8.9   Sepsis Labs: No results for input(s): PROCALCITON, LATICACIDVEN in the last 168 hours.  Recent Results (from the past 240 hour(s))  Blood culture (routine x 2)     Status: None (Preliminary result)   Collection Time: 11/24/20  7:00 PM   Specimen: BLOOD RIGHT HAND  Result Value Ref Range Status   Specimen Description BLOOD RIGHT HAND  Final   Special Requests   Final    BOTTLES DRAWN AEROBIC AND ANAEROBIC Blood Culture adequate volume   Culture   Final    NO GROWTH 2 DAYS Performed at [facility], [address] [place], [place] [address]    Report Status PENDING  Incomplete  Blood culture (routine x 2)     Status: None (Preliminary result)   Collection Time: 11/24/20  7:10 PM   Specimen: BLOOD RIGHT ARM  Result Value Ref Range Status   Specimen Description BLOOD RIGHT ARM  Final   Special Requests   Final    BOTTLES DRAWN AEROBIC AND ANAEROBIC Blood Culture adequate volume   Culture   Final    NO GROWTH 2 DAYS Performed at [facility], [address] [place], [place] [address]    Report Status PENDING  Incomplete  Resp Panel by RT-PCR (Flu  A&B, Covid) Nasopharyngeal Swab     Status: None   Collection Time: 11/24/20  7:37 PM   Specimen: Nasopharyngeal Swab; Nasopharyngeal(NP) swabs in vial transport medium  Result Value Ref Range Status   SARS Coronavirus 2 by RT PCR NEGATIVE NEGATIVE Final    Comment: (NOTE) SARS-CoV-2 target nucleic acids are NOT DETECTED.  The SARS-CoV-2 RNA is generally detectable in upper respiratory specimens during the acute phase of infection. The lowest concentration of  SARS-CoV-2 viral copies this assay can detect is 138 copies/mL. A negative result does not preclude SARS-Cov-2 infection and should not be used as the sole basis for treatment or other patient management decisions. A negative result may occur with  improper specimen collection/handling, submission of specimen other than nasopharyngeal swab, presence of viral mutation(s) within the areas targeted by this assay, and inadequate number of viral copies(<138 copies/mL). A negative result must be combined with clinical observations, patient history, and epidemiological information. The expected result is Negative.  Fact Sheet for Patients:  BloggerCourse.com  Fact Sheet for Healthcare Providers:  SeriousBroker.it  This test is no t yet approved or cleared by the [place] FDA and  has been authorized for detection and/or diagnosis of SARS-CoV-2 by FDA under an Emergency Use Authorization (EUA). This EUA will remain  in effect (meaning this test can be used) for the duration of the COVID-19 declaration under Section 564(b)(1) of the Act, 21 U.S.C.section 360bbb-3(b)(1), unless the authorization is terminated  or revoked sooner.       Influenza A by PCR NEGATIVE NEGATIVE Final   Influenza B by PCR NEGATIVE NEGATIVE Final    Comment: (NOTE) The Xpert Xpress SARS-CoV-2/FLU/RSV plus assay is intended as an aid in the diagnosis of influenza from Nasopharyngeal swab specimens and should not be used as a sole basis for treatment. Nasal washings and aspirates are unacceptable for Xpert Xpress SARS-CoV-2/FLU/RSV testing.  Fact Sheet for Patients: BloggerCourse.com  Fact Sheet for Healthcare Providers: SeriousBroker.it  This test is not yet approved or cleared by the [place] FDA and has been authorized for detection and/or diagnosis of SARS-CoV-2 by FDA under an Emergency Use  Authorization (EUA). This EUA will remain in effect (meaning this test can be used) for the duration of the COVID-19 declaration under Section 564(b)(1) of the Act, 21 U.S.C. section 360bbb-3(b)(1), unless the authorization is terminated or revoked.  Performed at [facility], [address] [place], [place] [address]   MRSA PCR Screening     Status: None   Collection Time: 11/25/20  6:59 AM   Specimen: Nasal Mucosa; Nasopharyngeal  Result Value Ref Range Status   MRSA by PCR NEGATIVE NEGATIVE Final    Comment:        The GeneXpert MRSA Assay (FDA approved for NASAL specimens only), is one component of a comprehensive MRSA colonization surveillance program. It is not intended to diagnose MRSA infection nor to guide or monitor treatment for MRSA infections. Performed at [facility], [address], [place], [place] [address]          Radiology Studies: CT Head Wo Contrast  Result Date: 11/24/2020 CLINICAL DATA:  Mental status change EXAM: CT HEAD WITHOUT CONTRAST TECHNIQUE: Contiguous axial images were obtained from the base of the skull through the vertex without intravenous contrast. COMPARISON:  CT brain 03/09/2015 FINDINGS: Brain: No acute territorial infarction, hemorrhage, or intracranial mass. Mild atrophy. Stable ventricle size. Vascular: No hyperdense vessels.  Carotid vascular calcification Skull: No fracture Sinuses/Orbits:  No acute finding. Other: Small left forehead scalp swelling IMPRESSION: 1. No CT evidence for acute intracranial abnormality. 2. Mild atrophy Electronically Signed   By: Jasmine Pang M.D.   On: 11/24/2020 19:23   DG Chest Port 1 View  Result Date: 11/24/2020 CLINICAL DATA:  60 year old male with altered mental status. EXAM: PORTABLE CHEST 1 VIEW COMPARISON:  Chest radiograph dated 04/01/2015 FINDINGS: No focal consolidation, pleural effusion, or pneumothorax. The cardiac silhouette is within limits. No acute osseous pathology.  Atherosclerotic calcification of the aorta. IMPRESSION: No active disease. Electronically Signed   By: Elgie Collard M.D.   On: 11/24/2020 19:08        Scheduled Meds: . enoxaparin (LOVENOX) injection  40 mg Subcutaneous QHS  . folic acid  1 mg Oral Daily  . insulin aspart  0-6 Units Subcutaneous Q4H  . multivitamin with minerals  1 tablet Oral Daily  . potassium chloride  40 mEq Oral Once  . thiamine  100 mg Oral Daily   Continuous Infusions:   LOS: 2 days    Time spent: 39 minutes spent on chart review, discussion with nursing staff, consultants, updating family and interview/physical exam; more than 50% of that time was spent in counseling and/or coordination of care.    Alvira Philips [place], DO [facility] Available via Epic secure chat 7am-7pm After these hours, please refer to coverage provider listed on [email or web address] 11/26/2020, 12:23 PM

## 2020-11-27 ENCOUNTER — Encounter (HOSPITAL_COMMUNITY): Payer: Self-pay | Admitting: Pulmonary Disease

## 2020-11-27 DIAGNOSIS — E871 Hypo-osmolality and hyponatremia: Secondary | ICD-10-CM | POA: Diagnosis not present

## 2020-11-27 LAB — CBC
HCT: 31.6 % — ABNORMAL LOW (ref 39.0–52.0)
Hemoglobin: 11.1 g/dL — ABNORMAL LOW (ref 13.0–17.0)
MCH: 29.9 pg (ref 26.0–34.0)
MCHC: 35.1 g/dL (ref 30.0–36.0)
MCV: 85.2 fL (ref 80.0–100.0)
Platelets: 289 10*3/uL (ref 150–400)
RBC: 3.71 MIL/uL — ABNORMAL LOW (ref 4.22–5.81)
RDW: 15.2 % (ref 11.5–15.5)
WBC: 10.3 10*3/uL (ref 4.0–10.5)
nRBC: 0 % (ref 0.0–0.2)

## 2020-11-27 LAB — COMPREHENSIVE METABOLIC PANEL
ALT: 39 U/L (ref 0–44)
AST: 41 U/L (ref 15–41)
Albumin: 2.8 g/dL — ABNORMAL LOW (ref 3.5–5.0)
Alkaline Phosphatase: 51 U/L (ref 38–126)
Anion gap: 8 (ref 5–15)
BUN: 6 mg/dL — ABNORMAL LOW (ref 8–23)
CO2: 23 mmol/L (ref 22–32)
Calcium: 8.2 mg/dL — ABNORMAL LOW (ref 8.9–10.3)
Chloride: 94 mmol/L — ABNORMAL LOW (ref 98–111)
Creatinine, Ser: 0.75 mg/dL (ref 0.61–1.24)
GFR, Estimated: >60 mL/min (ref >60)
Glucose, Bld: 109 mg/dL — ABNORMAL HIGH (ref 70–99)
Potassium: 3.9 mmol/L (ref 3.5–5.1)
Sodium: 125 mmol/L — ABNORMAL LOW (ref 135–145)
Total Bilirubin: 0.8 mg/dL (ref 0.3–1.2)
Total Protein: 5.6 g/dL — ABNORMAL LOW (ref 6.5–8.1)

## 2020-11-27 LAB — GLUCOSE, CAPILLARY
Glucose-Capillary: 104 mg/dL — ABNORMAL HIGH (ref 70–99)
Glucose-Capillary: 109 mg/dL — ABNORMAL HIGH (ref 70–99)
Glucose-Capillary: 118 mg/dL — ABNORMAL HIGH (ref 70–99)
Glucose-Capillary: 148 mg/dL — ABNORMAL HIGH (ref 70–99)
Glucose-Capillary: 163 mg/dL — ABNORMAL HIGH (ref 70–99)
Glucose-Capillary: 170 mg/dL — ABNORMAL HIGH (ref 70–99)

## 2020-11-27 LAB — MAGNESIUM: Magnesium: 1.6 mg/dL — ABNORMAL LOW (ref 1.7–2.4)

## 2020-11-27 LAB — SODIUM: Sodium: 128 mmol/L — ABNORMAL LOW (ref 135–145)

## 2020-11-27 MED ORDER — DEXTROSE-NACL 5-0.9 % IV SOLN: INTRAVENOUS | Status: DC

## 2020-11-27 MED ORDER — NICOTINE 21 MG/24HR TD PT24
21.0000 mg | MEDICATED_PATCH | Freq: Every day | TRANSDERMAL | Status: DC
  Administered 2020-11-27 – 2020-11-30 (×4): 21 mg via TRANSDERMAL
  Filled 2020-11-27 (×5): qty 1

## 2020-11-27 MED ORDER — SALINE SPRAY 0.65 % NA SOLN
1.0000 | NASAL | Status: DC | PRN
  Administered 2020-11-27 – 2020-11-28 (×2): 1 via NASAL
  Filled 2020-11-27: qty 44

## 2020-11-27 MED ORDER — MAGNESIUM SULFATE 2 GM/50ML IV SOLN
2.0000 g | Freq: Once | INTRAVENOUS | Status: AC
  Administered 2020-11-27: 2 g via INTRAVENOUS
  Filled 2020-11-27: qty 50

## 2020-11-27 NOTE — Progress Notes (Signed)
PROGRESS NOTE    Scott Berry  ZOX:[id number] DOB: 1961/02/06 DOA: 11/24/2020 PCP: Adrienne Mocha, PA    Brief Narrative:  Scott Berry is a 60 year old homeless male with past medical history significant for essential hypertension, hyperlipidemia, CAD/NSTEMI s/p PCI to proximal RCA 2015, OSA, polysubstance abuse, colon perforation s/p sigmoid colectomy/diverting ileostomy with subsequent takedown who presented to [facility], ED on 5/26 with altered mental status.  Patient apparently hangs around [place] and was found to be acting odd with subsequent fall.  He was very weak in appearance, breathing heavily and confused; and subsequently bystanders called EMS.  In the ED, temperature 99.1 F, BP 138/73, HR 97, RR 18, SPO2 91% on room air.  Sodium 107, potassium 4.1, chloride 71, CO2 19, glucose 63, BUN 7, creatinine 0.86, AST 88, ALT 62, total bilirubin 2.2.  WBC 12.5, hemoglobin 12.0, platelets 362.  EtOH level less than 10.  CT head with no acute intracranial abnormality.  Chest x-ray with no acute cardiopulmonary disease process.  UDS negative.  Patient was initially admitted to the PCCM service due to severe hyponatremia requiring hypertonic saline.  Sodium improved from low 106 to 121.  Patient was transferred from [facility] to [facility] on 5/28.   Assessment & Plan:   Principal Problem:   Hyponatremia Active Problems:   Hypertension   Polysubstance abuse in past   Tobacco abuse   Dyslipidemia, goal LDL below 70   EtOH dependence (HCC)   Acute metabolic encephalopathy, POA Patient presenting to ED after bystanders initiated EMS for confusion, weakness.  CT head, chest x-ray, UDS, EtOH level negative and unrevealing.  Etiology likely secondary to chronic EtOH abuse disorder coupled with severe hyponatremia.  Continue treatment as below.  Severe hyponatremia Patient presenting with a sodium level of 107, in the setting of severe confusion.  CT head with no acute intracranial  abnormality.  Patient was initially admitted to the PCCM service and placed on hypertonic saline with improvement of sodium level.  Urine sodium 12, urine osmolality 158, likely secondary to beer potomania.  Hypertonic saline and IV fluids now discontinued. --Na 107>106>>121>125 --Continue encourage increase oral intake --starting D5NS at 71mL/hr --EtOH cessation --continue to monitor on telemety  Hypokalemia: Resolved --Potassium 3.9 this morning. --Follow electrolytes daily  Hypomagnesemia Magnesium 1.6, will replete. --Repeat magnesium level in a.m.  EtOH dependence EtOH level less than 10 on admission.  Etiology of hyponatremia as above likely secondary to beer potomania and chronic etoh abuse. --CIWAA protocol with symptom triggered Ativan --Folic acid, thiamine, multivitamin --TOC for substance abuse  CAD s/p PCI RCA 2015 Previously prescribed atorvastatin and aspirin, unlikely taking this outpatient.  History of essential hypertension BP 112/58 this morning, well controlled.  Previously prescribed lisinopril/HCTZ, unlikely compliant outpatient. --Continue monitor BP, off antihypertensives  Dysphagia Patient was seen by speech therapy on 5/27.  --Continue dysphagia 2 diet --Aspiration precautions  Severe protein calorie malnutrition Body mass index is 19.21 kg/m.  Patient with significant muscle wasting, fat depletion likely secondary to his caloric reliance on EtOH. --Dietitian consult  Homelessness Weakness/deconditioning/debility: Seen by PT/OT with recommendations of SNF placement.  TOC for placement.   DVT prophylaxis: enoxaparin (LOVENOX) injection 40 mg Start: 11/24/20 2200 SCDs Start: 11/24/20 2127    Code Status: Full Code Family Communication: Updated patient's friend Angelia via telephone this am  Disposition Plan:  Level of care: Progressive Status is: Inpatient  Remains inpatient appropriate because:Altered mental status, Ongoing diagnostic  testing needed not appropriate for outpatient  work up, Unsafe d/c plan, IV treatments appropriate due to intensity of illness or inability to take PO and Inpatient level of care appropriate due to severity of illness   Dispo: The patient is from: Homeless              Anticipated d/c is to: To be determined              Patient currently is not medically stable to d/c.   Difficult to place patient No   Consultants:   PCCM - signed off 5/28  Procedures:   None  Antimicrobials:   None   Subjective: Patient seen examined bedside, sleeping.  Mumbling speech.  Overnight drop in glucose and received IV dextrose.  Sodium up to 125 today.  Updated patient's friend/caregiver Angelia via telephone this morning.  She has known the patient for many years and has been assisting him with his alcohol abuse and bring him to rehab outpatient, unfortunately he has relapsed about 1 year ago.  She reports that he has a sister, but on known name and where she lives.  She has not seen the patient for the last few weeks and was wondering what happened to him.  Angelita hopes patient can be placed in rehab or assisted living; and appreciates all the care Norm Saltimber is receiving in the hospital.  No other acute events overnight per nursing staff.  Objective: Vitals:   11/27/20 0024 11/27/20 0330 11/27/20 0725 11/27/20 0736  BP: (!) 144/76 137/71  112/64  Pulse: (!) 115 (!) 118  100  Resp: (!) 21 (!) 21  (!) 21  Temp: 99 F (37.2 C) 99.2 F (37.3 C)  99.3 F (37.4 C)  TempSrc: Axillary Oral  Axillary  SpO2:   92% 92%  Weight:  49.2 kg    Height:        Intake/Output Summary (Last 24 hours) at 11/27/2020 1131 Last data filed at 11/27/2020 1115 Gross per 24 hour  Intake 177.68 ml  Output 1000 ml  Net -822.32 ml   Filed Weights   11/25/20 2334 11/26/20 0448 11/27/20 0330  Weight: 50.7 kg 50.4 kg 49.2 kg    Examination:  General exam: Appears calm and comfortable, chronically ill in appearance,  thin/cachectic with significant muscle wasting and fat depletion; mumbling speech Respiratory system: Coarse breath sounds bilaterally, slightly increased respiratory rate without accessory muscle use or increased respiratory effort, on room air Cardiovascular system: S1 & S2 heard, RRR. No JVD, murmurs, rubs, gallops or clicks. No pedal edema. Gastrointestinal system: Abdomen is nondistended, soft and nontender. No organomegaly or masses felt. Normal bowel sounds heard. Central nervous system: Alert, moves extremities independently Extremities: Symmetric 5 x 5 power. Skin: No rashes, lesions or ulcers Psychiatry: Unable to obtain secondary to current mental status    Data Reviewed: I have personally reviewed following labs and imaging studies  CBC: Recent Labs  Lab 11/24/20 1839 11/24/20 1922 11/24/20 2158 11/25/20 0038 11/25/20 0136 11/27/20 0335  WBC 12.5*  --  11.4* 10.4  --  10.3  HGB 12.0* 12.9* 11.5* 11.4* 12.2* 11.1*  HCT 32.7* 38.0* 31.0* 30.8* 36.0* 31.6*  MCV 81.5  --  81.6 81.5  --  85.2  PLT 362  --  360 316  --  289   Basic Metabolic Panel: Recent Labs  Lab 11/24/20 1839 11/24/20 1922 11/24/20 2045 11/24/20 2158 11/25/20 0038 11/25/20 0136 11/25/20 0221 11/25/20 1808 11/25/20 2207 11/26/20 0249 11/26/20 0515 11/26/20 1009 11/26/20 1839 11/27/20 0335  NA 107* 106*   < > 111* 114* 113*   < > 121*   < > 122* 121* 124* 125* 125*  K 4.1 4.1  --   --  3.4* 3.6  --   --   --   --  3.4*  --   --  3.9  CL 71*  --   --   --  82*  --   --   --   --   --  91*  --   --  94*  CO2 19*  --   --   --  20*  --   --   --   --   --  20*  --   --  23  GLUCOSE 63*  --   --   --  73  --   --   --   --   --  88  --   --  109*  BUN 7*  --   --   --  6*  --   --   --   --   --  6*  --   --  6*  CREATININE 0.86  --   --  0.84 0.76  --   --   --   --   --  0.70  --   --  0.75  CALCIUM 8.3*  --   --   --  7.8*  --   --   --   --   --  7.9*  --   --  8.2*  MG  --   --   --   --   1.4*  --   --  2.1  --   --  2.0  --   --  1.6*  PHOS  --   --   --   --  3.2  --   --   --   --   --   --   --   --   --    < > = values in this interval not displayed.   GFR: Estimated Creatinine Clearance: 66.6 mL/min (by C-G formula based on SCr of 0.75 mg/dL). Liver Function Tests: Recent Labs  Lab 11/24/20 1839 11/26/20 0515 11/27/20 0335  AST 88* 52* 41  ALT 62* 42 39  ALKPHOS 74 51 51  BILITOT 2.2* 0.9 0.8  PROT 6.0* 5.2* 5.6*  ALBUMIN 3.3* 2.7* 2.8*   No results for input(s): LIPASE, AMYLASE in the last 168 hours. No results for input(s): AMMONIA in the last 168 hours. Coagulation Profile: Recent Labs  Lab 11/24/20 2158  INR 1.0   Cardiac Enzymes: Recent Labs  Lab 11/24/20 2158  CKTOTAL 664*   BNP (last 3 results) No results for input(s): PROBNP in the last 8760 hours. HbA1C: Recent Labs    11/24/20 2158  HGBA1C 5.6   CBG: Recent Labs  Lab 11/26/20 2009 11/26/20 2118 11/27/20 0024 11/27/20 0328 11/27/20 0734  GLUCAP 68* 228* 109* 104* 118*   Lipid Profile: No results for input(s): CHOL, HDL, LDLCALC, TRIG, CHOLHDL, LDLDIRECT in the last 72 hours. Thyroid Function Tests: No results for input(s): TSH, T4TOTAL, FREET4, T3FREE, THYROIDAB in the last 72 hours. Anemia Panel: Recent Labs    11/24/20 2158  VITAMINB12 410  FOLATE 8.9   Sepsis Labs: No results for input(s): PROCALCITON, LATICACIDVEN in the last 168 hours.  Recent Results (from the past 240 hour(s))  Blood culture (routine x 2)  Status: None (Preliminary result)   Collection Time: 11/24/20  7:00 PM   Specimen: BLOOD RIGHT HAND  Result Value Ref Range Status   Specimen Description BLOOD RIGHT HAND  Final   Special Requests   Final    BOTTLES DRAWN AEROBIC AND ANAEROBIC Blood Culture adequate volume   Culture   Final    NO GROWTH 3 DAYS Performed at [facility], [address] [place], [place] [address]    Report Status PENDING  Incomplete  Blood culture (routine x  2)     Status: None (Preliminary result)   Collection Time: 11/24/20  7:10 PM   Specimen: BLOOD RIGHT ARM  Result Value Ref Range Status   Specimen Description BLOOD RIGHT ARM  Final   Special Requests   Final    BOTTLES DRAWN AEROBIC AND ANAEROBIC Blood Culture adequate volume   Culture   Final    NO GROWTH 3 DAYS Performed at [facility], [address] [place], [place] [address]    Report Status PENDING  Incomplete  Resp Panel by RT-PCR (Flu A&B, Covid) Nasopharyngeal Swab     Status: None   Collection Time: 11/24/20  7:37 PM   Specimen: Nasopharyngeal Swab; Nasopharyngeal(NP) swabs in vial transport medium  Result Value Ref Range Status   SARS Coronavirus 2 by RT PCR NEGATIVE NEGATIVE Final    Comment: (NOTE) SARS-CoV-2 target nucleic acids are NOT DETECTED.  The SARS-CoV-2 RNA is generally detectable in upper respiratory specimens during the acute phase of infection. The lowest concentration of SARS-CoV-2 viral copies this assay can detect is 138 copies/mL. A negative result does not preclude SARS-Cov-2 infection and should not be used as the sole basis for treatment or other patient management decisions. A negative result may occur with  improper specimen collection/handling, submission of specimen other than nasopharyngeal swab, presence of viral mutation(s) within the areas targeted by this assay, and inadequate number of viral copies(<138 copies/mL). A negative result must be combined with clinical observations, patient history, and epidemiological information. The expected result is Negative.  Fact Sheet for Patients:  BloggerCourse.com  Fact Sheet for Healthcare Providers:  SeriousBroker.it  This test is no t yet approved or cleared by the [place] FDA and  has been authorized for detection and/or diagnosis of SARS-CoV-2 by FDA under an Emergency Use Authorization (EUA). This EUA will remain  in effect  (meaning this test can be used) for the duration of the COVID-19 declaration under Section 564(b)(1) of the Act, 21 U.S.C.section 360bbb-3(b)(1), unless the authorization is terminated  or revoked sooner.       Influenza A by PCR NEGATIVE NEGATIVE Final   Influenza B by PCR NEGATIVE NEGATIVE Final    Comment: (NOTE) The Xpert Xpress SARS-CoV-2/FLU/RSV plus assay is intended as an aid in the diagnosis of influenza from Nasopharyngeal swab specimens and should not be used as a sole basis for treatment. Nasal washings and aspirates are unacceptable for Xpert Xpress SARS-CoV-2/FLU/RSV testing.  Fact Sheet for Patients: BloggerCourse.com  Fact Sheet for Healthcare Providers: SeriousBroker.it  This test is not yet approved or cleared by the [place] FDA and has been authorized for detection and/or diagnosis of SARS-CoV-2 by FDA under an Emergency Use Authorization (EUA). This EUA will remain in effect (meaning this test can be used) for the duration of the COVID-19 declaration under Section 564(b)(1) of the Act, 21 U.S.C. section 360bbb-3(b)(1), unless the authorization is terminated or revoked.  Performed at [facility]  Lab, [address], [place], [place] [address]   MRSA PCR Screening     Status: None   Collection Time: 11/25/20  6:59 AM   Specimen: Nasal Mucosa; Nasopharyngeal  Result Value Ref Range Status   MRSA by PCR NEGATIVE NEGATIVE Final    Comment:        The GeneXpert MRSA Assay (FDA approved for NASAL specimens only), is one component of a comprehensive MRSA colonization surveillance program. It is not intended to diagnose MRSA infection nor to guide or monitor treatment for MRSA infections. Performed at [facility], [address], [place], [place] [address]          Radiology Studies: No results found.      Scheduled Meds: . enoxaparin (LOVENOX) injection  40 mg Subcutaneous QHS   . folic acid  1 mg Oral Daily  . insulin aspart  0-6 Units Subcutaneous Q4H  . multivitamin with minerals  1 tablet Oral Daily  . thiamine  100 mg Oral Daily   Continuous Infusions: . dextrose 5 % and 0.9% NaCl 50 mL/hr at 11/27/20 1115     LOS: 3 days    Time spent: 39 minutes spent on chart review, discussion with nursing staff, consultants, updating family and interview/physical exam; more than 50% of that time was spent in counseling and/or coordination of care.    Alvira Philips [place], DO [facility] Available via Epic secure chat 7am-7pm After these hours, please refer to coverage provider listed on [email or web address] 11/27/2020, 11:31 AM

## 2020-11-27 NOTE — TOC Initial Note (Addendum)
Transition of Care [facility]) - Initial/Assessment Note    Patient Details  Name: Scott Berry MRN: [id number] Date of Birth: 07-17-1960  [facility]) CM/SW Contact:    Scott Berry, LCSWA Phone Number: 11/27/2020, 9:46 AM  Clinical Narrative:                  CSW received consult for possible SNF placement at time of discharge. CSW spoke with patients Berry Scott Berry regarding PT recommendation of SNF placement at time of discharge.  Patients Berry expressed understanding of PT recommendation and is agreeable to SNF placement for patient at time of discharge. Patients Berry Scott Berry gave CSW permission to fax out initial referral near [place] and and [employer] area. Patient has received the COVID vaccines. Patients Berry says patient has medicaid. CSW emailed [employer] in financial counseling to check on this and if patient does if we can add it to his chart. CSW received  Homeless consult for  Patient as well as substance use resources. Patient only oriented to person. CSW unable to complete consult at this time due to patients orientation.CSW spoke with patients Berry Scott Berry who confirms patient can stay with her following SNF placement. No further questions reported at this time. CSW to continue to follow and assist with discharge planning needs.  Expected Discharge Plan: Skilled Nursing Facility Barriers to Discharge: Continued Medical Work up   Patient Goals and CMS Choice   CMS Medicare.gov Compare Post Acute Care list provided to:: Patient Represenative (must comment) (Scott Berry) Choice offered to / list presented to : NA [name] Berry)  Expected Discharge Plan and Services Expected Discharge Plan: Skilled Nursing Facility In-house Referral: Clinical Social Work     Living arrangements for the past 2 months: Single Family Home                                      Prior Living Arrangements/Services Living arrangements for the past 2  months: Single Family Home Lives with:: Self,Friends Patient language and need for interpreter reviewed:: Yes Do you feel safe going back to the place where you live?: No   SNF  Need for Family Participation in Patient Care: Yes (Comment) Care giver support system in place?: Yes (comment)   Criminal Activity/Legal Involvement Pertinent to Current Situation/Hospitalization: No - Comment as needed  Activities of Daily Living Home Assistive Devices/Equipment: None ADL Screening (condition at time of admission) Is the patient deaf or have difficulty hearing?: No Does the patient have difficulty seeing, even when wearing glasses/contacts?: No Does the patient have difficulty concentrating, remembering, or making decisions?: Yes Patient able to express need for assistance with ADLs?: Yes Does the patient have difficulty dressing or bathing?: Yes Independently performs ADLs?: No Does the patient have difficulty walking or climbing stairs?: Yes Weakness of Legs: Both Weakness of Arms/Hands: Both  Permission Sought/Granted Permission sought to share information with : Case Manager,Family Electrical engineer Permission granted to share information with :  (Patient oriented to person)     Permission granted to share info w AGENCY: SNF        Emotional Assessment       Orientation: : Oriented to Self Alcohol / Substance Use: Not Applicable Psych Involvement: No (comment)  Admission diagnosis:  Hyponatremia [E87.1] Patient Active Problem List   Diagnosis Date Noted  . EtOH dependence (HCC) 11/26/2020  . Hyponatremia 11/24/2020  .  Foreign body GU tract 11/11/2017  . History of MI (myocardial infarction) 10/26/2016  . CAP (community acquired pneumonia) 04/01/2015  . Coronary artery disease due to lipid rich plaque 06/02/2014  . Tobacco use 06/02/2014  . ERRONEOUS ENCOUNTER--DISREGARD 05/31/2014  . Dyslipidemia, goal LDL below 70 05/08/2014  . CAD- cath  05/05/14- med Rx- possible PCI in the future   . NSTEMI, initial episode of care (HCC) 05/05/2014  . Ventricular tachycardia, non-sustained (HCC) 05/05/2014  . Angina decubitus (HCC) 04/26/2014  . Polysubstance abuse in past 08/15/2011  . Personal history of colonic polyps 08/15/2011  . Tobacco abuse 08/15/2011  . Hypertension 06/22/2011   PCP:  Adrienne Mocha, PA Pharmacy:   [facility] [address] [place] [address] Phone: [phone] Fax: [phone]  [facility] [place] [place] - [address] [place] [address] Phone: [phone] Fax: [phone]     Social Determinants of Health (SDOH) Interventions    Readmission Risk Interventions No flowsheet data found.

## 2020-11-27 NOTE — NC FL2 (Signed)
[place] MEDICAID FL2 LEVEL OF CARE SCREENING TOOL     IDENTIFICATION  Patient Name: Scott Berry Birthdate: 1960/10/25 Sex: male Admission Date (Current Location): 11/24/2020  [facility] and [place] Number:  Producer, television/film/video and Address:  [facility], [address], [place], [place] [address]      Provider Number: [id number]  Attending Physician Name and Address:  [place], Eric J, DO  Relative Name and Phone Number:  [name] [phone]    Current Level of Care: Hospital Recommended Level of Care: Skilled Nursing Facility Prior Approval Number:    Date Approved/Denied:   PASRR Number: [id number]  Discharge Plan: SNF    Current Diagnoses: Patient Active Problem List   Diagnosis Date Noted  . EtOH dependence (HCC) 11/26/2020  . Hyponatremia 11/24/2020  . Foreign body GU tract 11/11/2017  . History of MI (myocardial infarction) 10/26/2016  . CAP (community acquired pneumonia) 04/01/2015  . Coronary artery disease due to lipid rich plaque 06/02/2014  . Tobacco use 06/02/2014  . ERRONEOUS ENCOUNTER--DISREGARD 05/31/2014  . Dyslipidemia, goal LDL below 70 05/08/2014  . CAD- cath 05/05/14- med Rx- possible PCI in the future   . NSTEMI, initial episode of care (HCC) 05/05/2014  . Ventricular tachycardia, non-sustained (HCC) 05/05/2014  . Angina decubitus (HCC) 04/26/2014  . Polysubstance abuse in past 08/15/2011  . Personal history of colonic polyps 08/15/2011  . Tobacco abuse 08/15/2011  . Hypertension 06/22/2011    Orientation RESPIRATION BLADDER Height & Weight     Self  O2 (Nasal Cannula 3 liters) Incontinent,External catheter (External Urinary Catheter) Weight: 108 lb 7.5 oz (49.2 kg) Height:  5\' 3"  (160 cm)  BEHAVIORAL SYMPTOMS/MOOD NEUROLOGICAL BOWEL NUTRITION STATUS      Incontinent Diet (See Discharge Summary)  AMBULATORY STATUS COMMUNICATION OF NEEDS Skin   Extensive Assist Verbally Other (Comment) (Abrasion  head,nose,hand,cracking scrotum bilateral, wound incision open or dehiced buttocks,R, scabbed pressure ulcer,wound incicsion open or dehiced MASD scrotum bilateral,moisture barrier,wound incision open or dehiced face L,upper,other)                       Personal Care Assistance Level of Assistance  Bathing,Feeding,Dressing Bathing Assistance: Limited assistance Feeding assistance: Independent Dressing Assistance: Limited assistance     Functional Limitations Info  Sight,Hearing,Speech Sight Info: Adequate Hearing Info: Adequate      SPECIAL CARE FACTORS FREQUENCY  PT (By licensed PT),OT (By licensed OT)     PT Frequency: 5x min weekly OT Frequency: 5x min weekly            Contractures Contractures Info: Not present    Additional Factors Info  Code Status,Allergies,Insulin Sliding Scale Code Status Info: FULL Allergies Info: Penicillins   Insulin Sliding Scale Info: insulin aspart (novoLOG) injection 0-6 Units every 4 hours,       Current Medications (11/27/2020):  This is the current hospital active medication list Current Facility-Administered Medications  Medication Dose Route Frequency Provider Last Rate Last Admin  . dextrose 5 %-0.9 % sodium chloride infusion   Intravenous Continuous 11/29/2020, Eric J, DO      . docusate sodium (COLACE) capsule 100 mg  100 mg Oral BID PRN Masoudi, Elhamalsadat, MD      . enoxaparin (LOVENOX) injection 40 mg  40 mg Subcutaneous QHS Masoudi, Elhamalsadat, MD   40 mg at 11/26/20 2357  . folic acid (FOLVITE) tablet 1 mg  1 mg Oral Daily Masoudi, Elhamalsadat, MD   1 mg at 11/26/20 1749  .  insulin aspart (novoLOG) injection 0-6 Units  0-6 Units Subcutaneous Q4H Masoudi, Elhamalsadat, MD   1 Units at 11/26/20 1826  . LORazepam (ATIVAN) injection 1-2 mg  1-2 mg Intravenous Q1H PRN Masoudi, Elhamalsadat, MD   2 mg at 11/27/20 0151  . LORazepam (ATIVAN) tablet 1-4 mg  1-4 mg Oral Q1H PRN Masoudi, Shawna Orleans, MD       Or  . LORazepam  (ATIVAN) injection 1-4 mg  1-4 mg Intravenous Q1H PRN Masoudi, Elhamalsadat, MD      . multivitamin with minerals tablet 1 tablet  1 tablet Oral Daily Masoudi, Elhamalsadat, MD   1 tablet at 11/26/20 1748  . polyethylene glycol (MIRALAX / GLYCOLAX) packet 17 g  17 g Oral Daily PRN Masoudi, Elhamalsadat, MD      . sodium chloride (OCEAN) 0.65 % nasal spray 1 spray  1 spray Each Nare PRN [place], Eric J, DO      . thiamine tablet 100 mg  100 mg Oral Daily Masoudi, Elhamalsadat, MD   100 mg at 11/26/20 1748     Discharge Medications: Please see discharge summary for a list of discharge medications.  Relevant Imaging Results:  Relevant Lab Results:   Additional Information [id number]  Terrial Rhodes, LCSWA

## 2020-11-27 NOTE — Progress Notes (Signed)
Attempted to obtain ekg- not successful due to muscle tremors.

## 2020-11-27 NOTE — Plan of Care (Signed)
  Problem: Clinical Measurements: Goal: Ability to maintain clinical measurements within normal limits will improve Outcome: Progressing Goal: Diagnostic test results will improve Outcome: Progressing   Problem: Safety: Goal: Ability to remain free from injury will improve Outcome: Progressing   Problem: Health Behavior/Discharge Planning: Goal: Ability to manage health-related needs will improve Outcome: Not Progressing   Problem: Elimination: Goal: Will not experience complications related to urinary retention Outcome: Completed/Met

## 2020-11-28 DIAGNOSIS — L8990 Pressure ulcer of unspecified site, unspecified stage: Secondary | ICD-10-CM | POA: Insufficient documentation

## 2020-11-28 DIAGNOSIS — E871 Hypo-osmolality and hyponatremia: Secondary | ICD-10-CM | POA: Diagnosis not present

## 2020-11-28 DIAGNOSIS — E43 Unspecified severe protein-calorie malnutrition: Secondary | ICD-10-CM | POA: Insufficient documentation

## 2020-11-28 LAB — GLUCOSE, CAPILLARY
Glucose-Capillary: 115 mg/dL — ABNORMAL HIGH (ref 70–99)
Glucose-Capillary: 117 mg/dL — ABNORMAL HIGH (ref 70–99)
Glucose-Capillary: 125 mg/dL — ABNORMAL HIGH (ref 70–99)
Glucose-Capillary: 127 mg/dL — ABNORMAL HIGH (ref 70–99)
Glucose-Capillary: 151 mg/dL — ABNORMAL HIGH (ref 70–99)
Glucose-Capillary: 169 mg/dL — ABNORMAL HIGH (ref 70–99)
Glucose-Capillary: 85 mg/dL (ref 70–99)

## 2020-11-28 LAB — MAGNESIUM: Magnesium: 1.7 mg/dL (ref 1.7–2.4)

## 2020-11-28 LAB — COMPREHENSIVE METABOLIC PANEL
ALT: 34 U/L (ref 0–44)
AST: 31 U/L (ref 15–41)
Albumin: 2.5 g/dL — ABNORMAL LOW (ref 3.5–5.0)
Alkaline Phosphatase: 51 U/L (ref 38–126)
Anion gap: 8 (ref 5–15)
BUN: 8 mg/dL (ref 8–23)
CO2: 22 mmol/L (ref 22–32)
Calcium: 8.3 mg/dL — ABNORMAL LOW (ref 8.9–10.3)
Chloride: 99 mmol/L (ref 98–111)
Creatinine, Ser: 0.77 mg/dL (ref 0.61–1.24)
GFR, Estimated: >60 mL/min (ref >60)
Glucose, Bld: 125 mg/dL — ABNORMAL HIGH (ref 70–99)
Potassium: 3.9 mmol/L (ref 3.5–5.1)
Sodium: 129 mmol/L — ABNORMAL LOW (ref 135–145)
Total Bilirubin: 0.7 mg/dL (ref 0.3–1.2)
Total Protein: 5.7 g/dL — ABNORMAL LOW (ref 6.5–8.1)

## 2020-11-28 MED ORDER — ENSURE ENLIVE PO LIQD
237.0000 mL | Freq: Three times a day (TID) | ORAL | Status: DC
  Administered 2020-11-30 (×2): 237 mL via ORAL

## 2020-11-28 MED ORDER — MAGNESIUM SULFATE 2 GM/50ML IV SOLN
2.0000 g | Freq: Once | INTRAVENOUS | Status: AC
  Administered 2020-11-28: 2 g via INTRAVENOUS
  Filled 2020-11-28: qty 50

## 2020-11-28 MED ORDER — OLANZAPINE 5 MG PO TBDP
2.5000 mg | ORAL_TABLET | Freq: Four times a day (QID) | ORAL | Status: DC | PRN
  Administered 2020-11-29 (×2): 2.5 mg via ORAL
  Filled 2020-11-28 (×4): qty 0.5

## 2020-11-28 NOTE — TOC Progression Note (Signed)
Transition of Care [facility]) - Progression Note    Patient Details  Name: Scott Berry MRN: [id number] Date of Birth: 12-07-1960  Transition of Care [facility]) CM/SW Contact  Terrial Rhodes, LCSWA Phone Number: 11/28/2020, 2:45 PM  Clinical Narrative:      No current SNF bed offers. Barriers to placement. MD informed. CSW will continue to follow and assist with discharge planning needs.   Expected Discharge Plan: Skilled Nursing Facility Barriers to Discharge: Continued Medical Work up  Expected Discharge Plan and Services Expected Discharge Plan: Skilled Nursing Facility In-house Referral: Clinical Social Work     Living arrangements for the past 2 months: Single Family Home                                       Social Determinants of Health (SDOH) Interventions    Readmission Risk Interventions No flowsheet data found.

## 2020-11-28 NOTE — Progress Notes (Signed)
Pt with an elevated hr pf 136, pb-112/72, r-20, ox @ 88% on 5 L [place]. Provider on call paged. Will await further orders and continue to monitor pt. o2 up to 96% on 4l [place]. Pt repositioned in bed and told to cough

## 2020-11-28 NOTE — Progress Notes (Addendum)
PROGRESS NOTE    Scott Monasimber K Mchan  ZOX:[id number] DOB: 08/08/1960 DOA: 11/24/2020 PCP: Adrienne MochaQuinn, Kiera A, PA    Brief Narrative:  Scott Berry is a 60 year old homeless male with past medical history significant for essential hypertension, hyperlipidemia, CAD/NSTEMI s/p PCI to proximal RCA 2015, OSA, polysubstance abuse, colon perforation s/p sigmoid colectomy/diverting ileostomy with subsequent takedown who [facility] with altered mental status.  Patient apparently [place] and was found to be acting odd with subsequent fall.  He was very weak in appearance, breathing heavily and confused; and subsequently bystanders called EMS.  In the ED, temperature 99.1 F, BP 138/73, HR 97, RR 18, SPO2 91% on room air.  Sodium 107, potassium 4.1, chloride 71, CO2 19, glucose 63, BUN 7, creatinine 0.86, AST 88, ALT 62, total bilirubin 2.2.  WBC 12.5, hemoglobin 12.0, platelets 362.  EtOH level less than 10.  CT head with no acute intracranial abnormality.  Chest x-ray with no acute cardiopulmonary disease process.  UDS negative.  Patient was initially admitted to the PCCM service due to severe hyponatremia requiring hypertonic saline.  Sodium improved from low 106 to 121.  [facility]   Assessment & Plan:   Principal Problem:   Hyponatremia Active Problems:   Hypertension   Polysubstance abuse in past   Tobacco abuse   Dyslipidemia, goal LDL below 70   EtOH dependence (HCC)   Pressure injury of skin   Protein-calorie malnutrition, severe   Acute metabolic encephalopathy, POA Patient presenting to ED after bystanders initiated EMS for confusion, weakness.  CT head, chest x-ray, UDS, EtOH level negative and unrevealing.  Etiology likely secondary to chronic EtOH abuse disorder coupled with severe hyponatremia.  Continue treatment as below.  Severe hyponatremia Patient presenting with a sodium level of 107, in the  setting of severe confusion.  CT head with no acute intracranial abnormality.  Patient was initially admitted to the PCCM service and placed on hypertonic saline with improvement of sodium level.  Urine sodium 12, urine osmolality 158, likely secondary to beer potomania.  Hypertonic saline and IV fluids now discontinued. --Na 107>106>>121>125>129 --Continue encourage increase oral intake --continue D5NS at 2650mL/hr until oral intake improves --EtOH cessation --continue to monitor on telemety  Hypokalemia: Resolved --Potassium 3.9 this morning. --Follow electrolytes daily  Hypomagnesemia Magnesium 1.6, will replete. --Repeat magnesium level in a.m.  EtOH dependence EtOH level less than 10 on admission.  Etiology of hyponatremia as above likely secondary to beer potomania and chronic etoh abuse. --CIWAA protocol with symptom triggered Ativan --Folic acid, thiamine, multivitamin --TOC for substance abuse  CAD s/p PCI RCA 2015 Previously prescribed atorvastatin and aspirin, unlikely taking this outpatient.  History of essential hypertension BP 112/58 this morning, well controlled.  Previously prescribed lisinopril/HCTZ, unlikely compliant outpatient. --Continue monitor BP, off antihypertensives  Dysphagia Patient was seen by speech therapy on 5/27.  --Continue dysphagia 2 diet --Aspiration precautions  Tobacco use disorder --Nicotine patch  Severe protein calorie malnutrition Body mass index is 19.45 kg/m.  Patient with significant muscle wasting, fat depletion likely secondary to his caloric reliance on EtOH. Nutrition Status: Nutrition Problem: Severe Malnutrition Etiology: social / environmental circumstances (polysubstance abuse) Signs/Symptoms: severe muscle depletion,severe fat depletion,percent weight loss (16% weight loss within 6 months) Percent weight loss: 16 %   --Dietitian following, continue to encourage increased oral intake and  supplements  Homelessness Weakness/deconditioning/debility: Seen by PT/OT with recommendations of SNF placement.  TOC for placement.   DVT prophylaxis: enoxaparin (LOVENOX) injection 40 mg Start: 11/24/20 2200 SCDs Start: 11/24/20 2127    Code Status: Full Code Family Communication: Updated patient's friend Angelia via telephone this am  Disposition Plan:  Level of care: Progressive Status is: Inpatient  Remains inpatient appropriate because:Altered mental status, Ongoing diagnostic testing needed not appropriate for outpatient work up, Unsafe d/c plan, IV treatments appropriate due to intensity of illness or inability to take PO and Inpatient level of care appropriate due to severity of illness   Dispo: The patient is from: Homeless              Anticipated d/c is to: SNF              Patient currently is not medically stable to d/c.   Difficult to place patient Yes   Consultants:   PCCM - signed off 5/28  Procedures:   None  Antimicrobials:   None   Subjective: Patient seen examined bedside, sleeping.  Mumbling speech.  NT reported patient ate roughly 50% of his breakfast this morning.  Unable to obtain any further ROS due to his mental status.  TOC working on placement, may need guardianship.  No acute concerns overnight per nursing staff.  Objective: Vitals:   11/27/20 2000 11/27/20 2345 11/28/20 0143 11/28/20 0436  BP: 127/87 140/83 138/74 140/85  Pulse: (!) 116 (!) 109  (!) 108  Resp: (!) Temp: 99.1 F (37.3 C)   97.6 F (36.4 C)  TempSrc: Oral   Axillary  SpO2: 100% 100%  100%  Weight:    49.8 kg  Height:        Intake/Output Summary (Last 24 hours) at 11/28/2020 1249 Last data filed at 11/28/2020 0500 Gross per 24 hour  Intake 1127.4 ml  Output 850 ml  Net 277.4 ml   Filed Weights   11/26/20 0448 11/27/20 0330 11/28/20 0436  Weight: 50.4 kg 49.2 kg 49.8 kg    Examination:  General exam: Appears calm and comfortable, chronically  ill in appearance, thin/cachectic with significant muscle wasting and fat depletion; mumbling speech Respiratory system: Coarse breath sounds bilaterally, slightly increased respiratory rate without accessory muscle use or increased respiratory effort, on room air Cardiovascular system: S1 & S2 heard, RRR. No JVD, murmurs, rubs, gallops or clicks. No pedal edema. Gastrointestinal system: Abdomen is nondistended, soft and nontender. No organomegaly or masses felt. Normal bowel sounds heard. Central nervous system: Alert, moves extremities independently Extremities: Symmetric 5 x 5 power. Skin: No rashes, lesions or ulcers Psychiatry: Unable to obtain secondary to current mental status    Data Reviewed: I have personally reviewed following labs and imaging studies  CBC: Recent Labs  Lab 11/24/20 1839 11/24/20 1922 11/24/20 2158 11/25/20 0038 11/25/20 0136 11/27/20 0335  WBC 12.5*  --  11.4* 10.4  --  10.3  HGB 12.0* 12.9* 11.5* 11.4* 12.2* 11.1*  HCT 32.7* 38.0* 31.0* 30.8* 36.0* 31.6*  MCV 81.5  --  81.6 81.5  --  85.2  PLT 362  --  360 316  --  289   Basic Metabolic Panel: Recent Labs  Lab 11/24/20 1839 11/24/20 1922 11/24/20 2158 11/25/20 0038 11/25/20 0136 11/25/20 0221 11/25/20 1808 11/25/20 2207 11/26/20 0515 11/26/20 1009 11/26/20 1839 11/27/20 0335 11/27/20 1416 11/28/20 0107  NA 107*   < > 111* 114* 113*   < > 121*   < > 121* 124* 125* 125* 128* 129*  K 4.1   < >  --  3.4* 3.6  --   --   --  3.4*  --   --  3.9  --  3.9  CL 71*  --   --  82*  --   --   --   --  91*  --   --  94*  --  99  CO2 19*  --   --  20*  --   --   --   --  20*  --   --  23  --  22  GLUCOSE 63*  --   --  73  --   --   --   --  88  --   --  109*  --  125*  BUN 7*  --   --  6*  --   --   --   --  6*  --   --  6*  --  8  CREATININE 0.86  --  0.84 0.76  --   --   --   --  0.70  --   --  0.75  --  0.77  CALCIUM 8.3*  --   --  7.8*  --   --   --   --  7.9*  --   --  8.2*  --  8.3*  MG  --    --   --  1.4*  --   --  2.1  --  2.0  --   --  1.6*  --  1.7  PHOS  --   --   --  3.2  --   --   --   --   --   --   --   --   --   --    < > = values in this interval not displayed.   GFR: Estimated Creatinine Clearance: 67.4 mL/min (by C-G formula based on SCr of 0.77 mg/dL). Liver Function Tests: Recent Labs  Lab 11/24/20 1839 11/26/20 0515 11/27/20 0335 11/28/20 0107  AST 88* 52* 41 31  ALT 62* 42 39 34  ALKPHOS 74 51 51 51  BILITOT 2.2* 0.9 0.8 0.7  PROT 6.0* 5.2* 5.6* 5.7*  ALBUMIN 3.3* 2.7* 2.8* 2.5*   No results for input(s): LIPASE, AMYLASE in the last 168 hours. No results for input(s): AMMONIA in the last 168 hours. Coagulation Profile: Recent Labs  Lab 11/24/20 2158  INR 1.0   Cardiac Enzymes: Recent Labs  Lab 11/24/20 2158  CKTOTAL 664*   BNP (last 3 results) No results for input(s): PROBNP in the last 8760 hours. HbA1C: No results for input(s): HGBA1C in the last 72 hours. CBG: Recent Labs  Lab 11/27/20 2046 11/28/20 0012 11/28/20 0435 11/28/20 0758 11/28/20 1133  GLUCAP 148* 169* 127* 125* 115*   Lipid Profile: No results for input(s): CHOL, HDL, LDLCALC, TRIG, CHOLHDL, LDLDIRECT in the last 72 hours. Thyroid Function Tests: No results for input(s): TSH, T4TOTAL, FREET4, T3FREE, THYROIDAB in the last 72 hours. Anemia Panel: No results for input(s): VITAMINB12, FOLATE, FERRITIN, TIBC, IRON, RETICCTPCT in the last 72 hours. Sepsis Labs: No results for input(s): PROCALCITON, LATICACIDVEN in the last 168 hours.  Recent Results (from the past 240 hour(s))  Blood culture (routine x 2)     Status: None (Preliminary result)   Collection Time: 11/24/20  7:00 PM   Specimen: BLOOD RIGHT HAND  Result Value Ref Range Status   Specimen Description BLOOD RIGHT HAND  Final   Special Requests  Final    BOTTLES DRAWN AEROBIC AND ANAEROBIC Blood Culture adequate volume   Culture   Final    NO GROWTH 4 DAYS Performed at [facility], [address], [place], [place] [address]    Report Status PENDING  Incomplete  Blood culture (routine x 2)     Status: None (Preliminary result)   Collection Time: 11/24/20  7:10 PM   Specimen: BLOOD RIGHT ARM  Result Value Ref Range Status   Specimen Description BLOOD RIGHT ARM  Final   Special Requests   Final    BOTTLES DRAWN AEROBIC AND ANAEROBIC Blood Culture adequate volume   Culture   Final    NO GROWTH 4 DAYS Performed at [facility], [address], [place], [place] [address]    Report Status PENDING  Incomplete  Resp Panel by RT-PCR (Flu A&B, Covid) Nasopharyngeal Swab     Status: None   Collection Time: 11/24/20  7:37 PM   Specimen: Nasopharyngeal Swab; Nasopharyngeal(NP) swabs in vial transport medium  Result Value Ref Range Status   SARS Coronavirus 2 by RT PCR NEGATIVE NEGATIVE Final    Comment: (NOTE) SARS-CoV-2 target nucleic acids are NOT DETECTED.  The SARS-CoV-2 RNA is generally detectable in upper respiratory specimens during the acute phase of infection. The lowest concentration of SARS-CoV-2 viral copies this assay can detect is 138 copies/mL. A negative result does not preclude SARS-Cov-2 infection and should not be used as the sole basis for treatment or other patient management decisions. A negative result may occur with  improper specimen collection/handling, submission of specimen other than nasopharyngeal swab, presence of viral mutation(s) within the areas targeted by this assay, and inadequate number of viral copies(<138 copies/mL). A negative result must be combined with clinical observations, patient history, and epidemiological information. The expected result is Negative.  Fact Sheet for Patients:  BloggerCourse.com  Fact Sheet for Healthcare Providers:  SeriousBroker.it  This test is no t yet approved or cleared by the [place] FDA and  has been authorized for detection and/or diagnosis of  SARS-CoV-2 by FDA under an Emergency Use Authorization (EUA). This EUA will remain  in effect (meaning this test can be used) for the duration of the COVID-19 declaration under Section 564(b)(1) of the Act, 21 U.S.C.section 360bbb-3(b)(1), unless the authorization is terminated  or revoked sooner.       Influenza A by PCR NEGATIVE NEGATIVE Final   Influenza B by PCR NEGATIVE NEGATIVE Final    Comment: (NOTE) The Xpert Xpress SARS-CoV-2/FLU/RSV plus assay is intended as an aid in the diagnosis of influenza from Nasopharyngeal swab specimens and should not be used as a sole basis for treatment. Nasal washings and aspirates are unacceptable for Xpert Xpress SARS-CoV-2/FLU/RSV testing.  Fact Sheet for Patients: BloggerCourse.com  Fact Sheet for Healthcare Providers: SeriousBroker.it  This test is not yet approved or cleared by the [place] FDA and has been authorized for detection and/or diagnosis of SARS-CoV-2 by FDA under an Emergency Use Authorization (EUA). This EUA will remain in effect (meaning this test can be used) for the duration of the COVID-19 declaration under Section 564(b)(1) of the Act, 21 U.S.C. section 360bbb-3(b)(1), unless the authorization is terminated or revoked.  Performed at [facility], [address] [place], [place] [address]   MRSA PCR Screening     Status: None   Collection Time: 11/25/20  6:59 AM   Specimen: Nasal Mucosa; Nasopharyngeal  Result Value Ref Range Status  MRSA by PCR NEGATIVE NEGATIVE Final    Comment:        The GeneXpert MRSA Assay (FDA approved for NASAL specimens only), is one component of a comprehensive MRSA colonization surveillance program. It is not intended to diagnose MRSA infection nor to guide or monitor treatment for MRSA infections. Performed at [facility], [address], [place], [place] [address]          Radiology Studies: No  results found.      Scheduled Meds: . enoxaparin (LOVENOX) injection  40 mg Subcutaneous QHS  . feeding supplement  237 mL Oral TID BM  . folic acid  1 mg Oral Daily  . insulin aspart  0-6 Units Subcutaneous Q4H  . multivitamin with minerals  1 tablet Oral Daily  . nicotine  21 mg Transdermal Daily  . thiamine  100 mg Oral Daily   Continuous Infusions: . dextrose 5 % and 0.9% NaCl 50 mL/hr at 11/28/20 0454     LOS: 4 days    Time spent: 39 minutes spent on chart review, discussion with nursing staff, consultants, updating family and interview/physical exam; more than 50% of that time was spent in counseling and/or coordination of care.    Alvira Philips [place], DO [facility] Available via Epic secure chat 7am-7pm After these hours, please refer to coverage provider listed on [email or web address] 11/28/2020, 12:49 PM

## 2020-11-28 NOTE — Progress Notes (Addendum)
Initial Nutrition Assessment  DOCUMENTATION CODES:   Severe malnutrition in context of social or environmental circumstances  INTERVENTION:   Ensure Enlive po TID, each supplement provides 350 kcal and 20 grams of protein  NUTRITION DIAGNOSIS:   Severe Malnutrition related to social / environmental circumstances (polysubstance abuse) as evidenced by severe muscle depletion,severe fat depletion,percent weight loss (16% weight loss within 6 months).  GOAL:   Patient will meet greater than or equal to 90% of their needs  MONITOR:   PO intake,Supplement acceptance,Skin  REASON FOR ASSESSMENT:   Consult Assessment of nutrition requirement/status,Poor PO  ASSESSMENT:   60 yo homeless male admitted with AMS. PMH includes HTN, HLD, CAD/NSTEMI, OSA, polysubstance abuse, colectomy for perforation.   Patient sleeping, did not wake up enough to have a conversation with RD.  S/P swallow evaluation with SLP 5/27.  Currently on a dysphagia 2 diet with thin liquids.  Meal intakes: 0-50%, average 17% x last 6 meals recorded Patient ate ~65% of breakfast today according to nurse tech. Patient requires a lot of assistance with meals. It takes him a while to eat, but he eats okay if he is assisted.    Labs reviewed. Na 129 CBG: [phone]  Medications reviewed and include folic acid, Novolog, MVI with minerals, thiamine, mag sulfate. IVF: D5 NS at 50 ml/h  Weight history reviewed. 6 months ago, patient weighed 59.2 kg, currently 49.8 kg. 16% weight loss within 6 months is severe.  NUTRITION - FOCUSED PHYSICAL EXAM:  Flowsheet Row Most Recent Value  Orbital Region Severe depletion  Upper Arm Region Severe depletion  Thoracic and Lumbar Region Moderate depletion  Buccal Region Severe depletion  Temple Region Severe depletion  Clavicle Bone Region Severe depletion  Clavicle and Acromion Bone Region Severe depletion  Scapular Bone Region Severe depletion  Dorsal Hand Mild  depletion  Patellar Region Severe depletion  Anterior Thigh Region Severe depletion  Posterior Calf Region Severe depletion  Edema (RD Assessment) None  Hair Reviewed  Eyes Unable to assess  Mouth Unable to assess  Skin Reviewed  Nails Reviewed       Diet Order:   Diet Order            DIET DYS 2 Room service appropriate? Yes with Assist; Fluid consistency: Thin  Diet effective now                 EDUCATION NEEDS:   Not appropriate for education at this time  Skin:  Skin Assessment: Skin Integrity Issues: Skin Integrity Issues:: Stage II,Other (Comment) Stage II: R buttocks Other: MASD bilateral scrotum; wound to left side of face   Last BM:  no BM documented  Height:   Ht Readings from Last 1 Encounters:  11/24/20 5\' 3"  (1.6 m)    Weight:   Wt Readings from Last 1 Encounters:  11/28/20 49.8 kg    Ideal Body Weight:  56.4 kg  BMI:  Body mass index is 19.45 kg/m.  Estimated Nutritional Needs:   Kcal:  1700-1900  Protein:  80-90 gm  Fluid:  >/= 1.7 L    11/30/20, RD, LDN, CNSC Please refer to Amion for contact information.

## 2020-11-29 ENCOUNTER — Ambulatory Visit: Payer: 59 | Admitting: Cardiology

## 2020-11-29 DIAGNOSIS — F1910 Other psychoactive substance abuse, uncomplicated: Secondary | ICD-10-CM

## 2020-11-29 DIAGNOSIS — I10 Essential (primary) hypertension: Secondary | ICD-10-CM | POA: Diagnosis not present

## 2020-11-29 DIAGNOSIS — E43 Unspecified severe protein-calorie malnutrition: Secondary | ICD-10-CM

## 2020-11-29 DIAGNOSIS — F1029 Alcohol dependence with unspecified alcohol-induced disorder: Secondary | ICD-10-CM

## 2020-11-29 DIAGNOSIS — E785 Hyperlipidemia, unspecified: Secondary | ICD-10-CM | POA: Diagnosis not present

## 2020-11-29 DIAGNOSIS — E871 Hypo-osmolality and hyponatremia: Secondary | ICD-10-CM | POA: Diagnosis not present

## 2020-11-29 LAB — CULTURE, BLOOD (ROUTINE X 2)
Culture: NO GROWTH
Culture: NO GROWTH
Special Requests: ADEQUATE
Special Requests: ADEQUATE

## 2020-11-29 LAB — COMPREHENSIVE METABOLIC PANEL
ALT: 31 U/L (ref 0–44)
AST: 39 U/L (ref 15–41)
Albumin: 2.6 g/dL — ABNORMAL LOW (ref 3.5–5.0)
Alkaline Phosphatase: 46 U/L (ref 38–126)
Anion gap: 14 (ref 5–15)
BUN: 11 mg/dL (ref 8–23)
CO2: 17 mmol/L — ABNORMAL LOW (ref 22–32)
Calcium: 8.6 mg/dL — ABNORMAL LOW (ref 8.9–10.3)
Chloride: 101 mmol/L (ref 98–111)
Creatinine, Ser: 0.8 mg/dL (ref 0.61–1.24)
GFR, Estimated: >60 mL/min (ref >60)
Glucose, Bld: 116 mg/dL — ABNORMAL HIGH (ref 70–99)
Potassium: 5.3 mmol/L — ABNORMAL HIGH (ref 3.5–5.1)
Sodium: 132 mmol/L — ABNORMAL LOW (ref 135–145)
Total Bilirubin: 1.2 mg/dL (ref 0.3–1.2)
Total Protein: 6 g/dL — ABNORMAL LOW (ref 6.5–8.1)

## 2020-11-29 LAB — CBC
HCT: 34.9 % — ABNORMAL LOW (ref 39.0–52.0)
Hemoglobin: 11.8 g/dL — ABNORMAL LOW (ref 13.0–17.0)
MCH: 29.8 pg (ref 26.0–34.0)
MCHC: 33.8 g/dL (ref 30.0–36.0)
MCV: 88.1 fL (ref 80.0–100.0)
Platelets: 301 10*3/uL (ref 150–400)
RBC: 3.96 MIL/uL — ABNORMAL LOW (ref 4.22–5.81)
RDW: 15.8 % — ABNORMAL HIGH (ref 11.5–15.5)
WBC: 11.3 10*3/uL — ABNORMAL HIGH (ref 4.0–10.5)
nRBC: 0 % (ref 0.0–0.2)

## 2020-11-29 LAB — PHOSPHORUS: Phosphorus: 5.2 mg/dL — ABNORMAL HIGH (ref 2.5–4.6)

## 2020-11-29 LAB — GLUCOSE, CAPILLARY
Glucose-Capillary: 149 mg/dL — ABNORMAL HIGH (ref 70–99)
Glucose-Capillary: 118 mg/dL — ABNORMAL HIGH (ref 70–99)
Glucose-Capillary: 111 mg/dL — ABNORMAL HIGH (ref 70–99)
Glucose-Capillary: 107 mg/dL — ABNORMAL HIGH (ref 70–99)
Glucose-Capillary: 147 mg/dL — ABNORMAL HIGH (ref 70–99)

## 2020-11-29 LAB — MAGNESIUM: Magnesium: 1.9 mg/dL (ref 1.7–2.4)

## 2020-11-29 MED ORDER — ACETAMINOPHEN 325 MG PO TABS
650.0000 mg | ORAL_TABLET | ORAL | Status: DC | PRN
  Administered 2020-11-29: 650 mg via ORAL
  Filled 2020-11-29: qty 2

## 2020-11-29 MED ORDER — MAGNESIUM SULFATE IN D5W 1-5 GM/100ML-% IV SOLN
1.0000 g | Freq: Once | INTRAVENOUS | Status: AC
  Administered 2020-11-29: 1 g via INTRAVENOUS
  Filled 2020-11-29: qty 100

## 2020-11-29 MED ORDER — LORAZEPAM 2 MG/ML IJ SOLN
0.5000 mg | INTRAMUSCULAR | Status: DC | PRN
  Administered 2020-11-30 (×3): 0.5 mg via INTRAVENOUS
  Filled 2020-11-29 (×3): qty 1

## 2020-11-29 MED ORDER — DIGOXIN 0.25 MG/ML IJ SOLN
0.2500 mg | Freq: Once | INTRAMUSCULAR | Status: AC
  Administered 2020-11-29: 0.25 mg via INTRAVENOUS
  Filled 2020-11-29: qty 1

## 2020-11-29 MED ORDER — HALOPERIDOL LACTATE 5 MG/ML IJ SOLN: 2.0000 mg | Freq: Four times a day (QID) | INTRAMUSCULAR | Status: DC | PRN

## 2020-11-29 NOTE — Progress Notes (Incomplete)
Cardiology Office Note:    Date:  11/29/2020   ID:  Scott Berry, DOB 09-15-1960, MRN [id number]  PCP:  Adrienne Mocha, PA   [facility] Providers Cardiologist:  Donato Schultz, MD { Click to update primary MD,subspecialty MD or APP then REFRESH:1}    Referring MD: Adrienne Mocha, PA    History of Present Illness:    Scott Berry is a 60 y.o. male here for follow up of   Is been over 3 years since last visit.  In review of prior notes, he has had prior drug use hypertension had nonsustained VT, EF 55%, cardiac catheterization revealed total occlusion of RCA with mild to moderate LAD and patent left circumflex.  Maximize medical therapy.  Had pneumonia in 2016.  Felt benefit from his Imdur for angina.  Cost has been an issue with some of his medications in the past.  Previously he complained of leg pain when working and feeling tired easy.  Today, ***   He denies any exertional chest pain, tightness, or pressure. He has no orthopnea, PND, LE edema, or lightheadedness   Past Medical History:  Diagnosis Date  . Coronary artery arteriosclerosis 2011   cath with long tubular heavily calcified mid LAD 60%, calcified RCA with multiple 50% stenosis, ostial left circ stenosis of 30% and calcified LM with no stenosis on medical management  . Hypertension   . 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 Personal history of colonic polyps 08/15/2011  . Polysubstance abuse in past 08/15/2011  . Pure hypercholesterolemia   . Shortness of breath    if don't take medicines  . Sleep apnea 1995   not a problem now  . Tobacco abuse 08/15/2011    Past Surgical History:  Procedure Laterality Date  . CARDIAC CATHETERIZATION  09/10/2009   Results in chart  . COLON SURGERY  14Jun2012   colectomy/diverting ileostomy - colon perf  . CYSTOSCOPY N/A 11/10/2017   Procedure: CYSTOSCOPY/REMOVAL OF FOREIGN BODY FROM URETHRA/CYSTOGRAM;  Surgeon: Heloise Purpura, MD;  Location: [facility];  Service: Urology;  Laterality: N/A;  . FOREIGN BODY REMOVAL RECTAL  Oct 2011  . ILEO LOOP DIVERSION  06/21/2011   Procedure: Takedown of loop ileostomy  . LAPAROTOMY N/A 11/10/2017   Procedure: EXPLORATORY LAPAROTOMY;  Surgeon: Heloise Purpura, MD;  Location: [facility];  Service: Urology;  Laterality: N/A;  . LEFT HEART CATHETERIZATION WITH CORONARY ANGIOGRAM N/A 05/05/2014   Procedure: LEFT HEART CATHETERIZATION WITH CORONARY ANGIOGRAM;  Surgeon: Micheline Chapman, MD;  Location: [facility] CATH LAB;  Service: Cardiovascular;  Laterality: N/A;  . Loop ileostomy takedown  20Dec2012   Procedure: Takedown of loop ileostomy  . PERCUTANEOUS CORONARY STENT INTERVENTION (PCI-S) Right 05/05/2014   Procedure: PERCUTANEOUS CORONARY STENT INTERVENTION (PCI-S);  Surgeon: Micheline Chapman, MD;  Location: [facility] CATH LAB;  Service: Cardiovascular;  Laterality: Right;  Prox RCA    Current Medications: No outpatient medications have been marked as taking for the 11/29/20 encounter (Appointment) with [name], MD.     Allergies:   Penicillins   Social History   Socioeconomic History  . Marital status: Divorced    Spouse name: Not on file  . Number of children: Not on file  . Years of education: Not on file  . Highest education level: Not on file  Occupational History  . Not on file  Tobacco Use  . Smoking status: Current Every Day Smoker    Packs/day:  0.50    Types: Cigarettes    Last attempt to quit: 07/03/2007    Years since quitting: 13.4  . Smokeless tobacco: Never Used  Substance and Sexual Activity  . Alcohol use: Yes    Alcohol/week: 3.0 standard drinks    Types: 3 Cans of beer per week    Comment: Recovered alcoholic x 6 years  . Drug use: No    Types: Other-see comments    Comment:  None in about 25 years; But has been "working around acetone"  . Sexual activity: Not on file  Other Topics Concern  . Not on file  Social History Narrative  . Not on file    Social Determinants of Health   Financial Resource Strain: Not on file  Food Insecurity: Not on file  Transportation Needs: Not on file  Physical Activity: Not on file  Stress: Not on file  Social Connections: Not on file     Family History: The patient's family history includes Pancreatic disease in his father; Stroke in his father and mother.  ROS:   Please see the history of present illness. All other systems reviewed and are negative.  EKGs/Labs/Other Studies Reviewed:    The following studies were reviewed today: Echo 06/30/20-  FINDINGS  Left Ventricle: Left ventricular ejection fraction, by estimation, is 40  to 45%. Left ventricular ejection fraction by 3D volume is 42 %. The left  ventricle has mildly decreased function. The left ventricle demonstrates  global hypokinesis. The average  left ventricular global longitudinal strain is -8.5 %. The global  longitudinal strain is abnormal. The left ventricular internal cavity size  was normal in size. There is no left ventricular hypertrophy. Left  ventricular diastolic parameters are  consistent with Grade I diastolic dysfunction (impaired relaxation).   Right Ventricle: The right ventricular size is normal. No increase in  right ventricular wall thickness. Right ventricular systolic function is  mildly reduced. Tricuspid regurgitation signal is inadequate for assessing  PA pressure.   Left Atrium: Left atrial size was normal in size.   Right Atrium: Right atrial size was normal in size.   Pericardium: There is no evidence of pericardial effusion.   Mitral Valve: The mitral valve is normal in structure. Trivial mitral  valve regurgitation. No evidence of mitral valve stenosis.   Tricuspid Valve: The tricuspid valve is normal in structure. Tricuspid  valve regurgitation is mild . No evidence of tricuspid stenosis.   Aortic Valve: The aortic valve is tricuspid. Aortic valve regurgitation is  trivial. Aortic  regurgitation PHT measures 465 msec. Mild aortic valve  sclerosis is present, with no evidence of aortic valve stenosis.   Pulmonic Valve: The pulmonic valve was normal in structure. Pulmonic valve  regurgitation is not visualized. No evidence of pulmonic stenosis.   Aorta: The aortic root is normal in size and structure.   Venous: The inferior vena cava was not well visualized.   IAS/Shunts: No atrial level shunt detected by color flow Doppler.   Vas [place] Lower Extremity Arterial Bilateral 06/30/20 Summary:  Right: Unable to detect flow in the external iliac artery, suspected  occlusion.  Occlusion of the common femoral artery and superficial femoral artery.  Dampened, monophasic flow in the distal superficial femoral, popliteal and  tibial arteries.   Left: Occlusion of the superficial femoral artery. Suspected occlusion of  the distal common femoral artery vs. heavily calcific plaque with  shadowing, unable to insonate flow. Monophasic flow in the distal  superficial femoral, popliteal and  tibial  arteries.   Vas [place] Lower Extremity Seg Multi 06/30/20: Summary:  Right: Resting right ankle-brachial index indicates moderate right lower  extremity arterial disease.  The right toe-brachial index is abnormal.   Left: Resting left ankle-brachial index indicates moderate left lower  extremity arterial disease.  The left toe-brachial index is abnormal.  EKG:   11/29/20-EKG is *** ordered today.    Recent Labs: 11/29/2020: ALT 31; BUN 11; Creatinine, Ser 0.80; Hemoglobin 11.8; Magnesium 1.9; Platelets 301; Potassium 5.3; Sodium 132   Recent Lipid Panel    Component Value Date/Time   CHOL 149 05/06/2014 0339   TRIG 47 05/06/2014 0339   HDL 55 05/06/2014 0339   CHOLHDL 2.7 05/06/2014 0339   VLDL 9 05/06/2014 0339   LDLCALC 85 05/06/2014 0339     Risk Assessment/Calculations:   {Does this patient have ATRIAL FIBRILLATION?[phone]}   Physical Exam:    VS:  There were  no vitals taken for this visit.    Wt Readings from Last 3 Encounters:  11/29/20 113 lb 5.1 oz (51.4 kg)  05/31/20 130 lb 9.6 oz (59.2 kg)  05/12/18 101 lb 6 oz (46 kg)     GEN: Well nourished, well developed in no acute distress HEENT: Normal NECK: No JVD; No carotid bruits LYMPHATICS: No lymphadenopathy CARDIAC: RRR, no murmurs, rubs, gallops RESPIRATORY:  Clear to auscultation without rales, wheezing or rhonchi  ABDOMEN: Soft, non-tender, non-distended MUSCULOSKELETAL: No edema; No deformity  SKIN: Warm and dry NEUROLOGIC:  Alert and oriented x 3 PSYCHIATRIC:  Normal affect   ASSESSMENT:    No diagnosis found. PLAN:    In order of problems listed above: Coronary disease with angina ***  SOB/dyspnea.  ***  Leg burning pain with usage *** Right groin hernia ***  Tobacco use ***  Alcohol use ***    {Are you ordering a CV Procedure (e.g. stress test, cath, DCCV, TEE, etc)?   Press F2        :[id number]}    Medication Adjustments/Labs and Tests Ordered: Current medicines are reviewed at length with the patient today.  Concerns regarding medicines are outlined above.  No orders of the defined types were placed in this encounter.  No orders of the defined types were placed in this encounter.   There are no Patient Instructions on file for this visit.    I,Alexis Bryant,acting as a Neurosurgeon for [employer], MD.,have documented all relevant documentation on the behalf of Donato Schultz, MD,as directed by  Donato Schultz, MD while in the presence of Donato Schultz, MD.  *** Signed, Estrella Deeds  11/29/2020 9:49 AM    [facility]

## 2020-11-29 NOTE — Progress Notes (Signed)
   11/28/20 2028  Assess: MEWS Score  Temp 98.1 F (36.7 C)  BP 115/68  ECG Heart Rate (!) 128  Resp 20  SpO2 98 %  O2 Device Nasal Cannula  Patient Activity (if Appropriate) In bed  O2 Flow Rate (L/min) 3 L/min  Assess: MEWS Score  MEWS Temp 0  MEWS Systolic 0  MEWS Pulse 2  MEWS RR 0  MEWS LOC 0  MEWS Score 2  MEWS Score Color Yellow  Assess: if the MEWS score is Yellow or Red  Were vital signs taken at a resting state? Yes  Focused Assessment No change from prior assessment  Early Detection of Sepsis Score *See Row Information* Low  MEWS guidelines implemented *See Row Information* No, previously yellow, continue vital signs every 4 hours  Treat  MEWS Interventions Administered scheduled meds/treatments  Pain Scale 0-10  Pain Score 0  Faces Pain Scale 0  Notify: Charge Nurse/RN  Name of Charge Nurse/RN Notified christina,rn  Date Charge Nurse/RN Notified 11/28/20  Time Charge Nurse/RN Notified 2030

## 2020-11-29 NOTE — Progress Notes (Addendum)
Patient with an elevated hr ranging in the 140s. Notified provider on call and patient to get a run of mag and some iv digoxin. Will continue to monitor pt. Pt to get iv dig and mag. Pt given dig and mag is running now. Hr down to 119.

## 2020-11-29 NOTE — Progress Notes (Signed)
   11/29/20 1500  What Happened  Was fall witnessed? No  Was patient injured? No  Patient found on floor  Found by Staff-comment  Stated prior activity to/from bed, chair, or stretcher  Follow Up  MD notified Dr Blake Divine  Time MD notified 1515  Time family notified  (no family to notify)  Additional tests No  Adult Fall Risk Assessment  Risk Factor Category (scoring not indicated) High fall risk per protocol (document High fall risk)  Patient Fall Risk Level High fall risk  Adult Fall Risk Interventions  Required Bundle Interventions *See Row Information* High fall risk - low, moderate, and high requirements implemented  Additional Interventions PT/OT need assessed if change in mobility from baseline  Screening for Fall Injury Risk (To be completed on HIGH fall risk patients) - Assessing Need for Floor Mats  Risk For Fall Injury- Criteria for Floor Mats Admitted as a result of a fall  Will Implement Floor Mats Yes  Pain Assessment  Pain Scale 0-10  Pain Score 0  PCA/Epidural/Spinal Assessment  Respiratory Pattern Regular;Unlabored  Neurological  Neuro (WDL) X  Level of Consciousness Alert  Orientation Level Oriented to person;Disoriented to place;Disoriented to time;Disoriented to situation  Cognition Follows commands  Speech Clear  Pupil Assessment  No  R Hand Grip Present  L Hand Grip Present  RUE Motor Response Responds to commands  LUE Motor Response Responds to commands  RLE Motor Response Responds to commands  LLE Motor Response Responds to commands  Neuro Symptoms Fatigue;Forgetful  Glasgow Coma Scale  Eye Opening 4  Best Verbal Response (NON-intubated) 4  Best Motor Response 6  Glasgow Coma Scale Score 14  Musculoskeletal  Musculoskeletal (WDL) X  Assistive Device None  Generalized Weakness Yes  Integumentary  Integumentary (WDL) X  Skin Color Appropriate for ethnicity  Skin Condition Dry  Skin Integrity Abrasion  Abrasion Location Head;Hand;Nose  Abrasion  Location Orientation Anterior;Medial

## 2020-11-29 NOTE — TOC Progression Note (Addendum)
Transition of Care [facility]) - Progression Note    Patient Details  Name: Scott Berry MRN: [id number] Date of Birth: Dec 20, 1960  Transition of Care [facility]) CM/SW Contact  Terrial Rhodes, LCSWA Phone Number: 11/29/2020, 2:51 PM  Clinical Narrative:       No current SNF bed offers. Barriers to placement. MD informed.Frances Maywood with Financial counseling confirmed that patients medicaid is not active.CSW will continue to follow and assist with discharge planning needs.   Expected Discharge Plan: Skilled Nursing Facility Barriers to Discharge: Continued Medical Work up  Expected Discharge Plan and Services Expected Discharge Plan: Skilled Nursing Facility In-house Referral: Clinical Social Work     Living arrangements for the past 2 months: Single Family Home                                       Social Determinants of Health (SDOH) Interventions    Readmission Risk Interventions No flowsheet data found.

## 2020-11-29 NOTE — Progress Notes (Signed)
PROGRESS NOTE    Scott Berry  TZG:[id number] DOB: [birth year] DOA: 11/24/2020 PCP: Adrienne Mocha, PA   Chief Complaint  Patient presents with  . Fatigue    Brief Narrative:  60 year old gentleman prior history of hypertension, coronary artery disease s/p PCI to proximal RCA, polysubstance abuse, s/p sigmoid colectomy/diverting ileostomy presents to ED for altered mental status.  Patient was initially admitted to [facility] service secondary to severe hyponatremia requiring hypertonic saline.  His initial sodium was 106 and it has improved to 132 today.  Assessment & Plan:   Principal Problem:   Hyponatremia Active Problems:   Hypertension   Polysubstance abuse in past   Tobacco abuse   Dyslipidemia, goal LDL below 70   EtOH dependence (HCC)   Pressure injury of skin   Protein-calorie malnutrition, severe   Acute metabolic encephalopathy present on admission possibly secondary to severe hyponatremia secondary to chronic EtOH abuse CT of the head does not show any intracranial abnormality.  He was initially admitted by [facility] service and was treated with hypertonic saline with improvement in sodium levels. Continue to monitor.    Hypokalemia and hypomagnesemia have been replaced    EtOH dependence EtOH for substance abuse continue with folic acid thiamine and multivitamin      History of's coronary artery disease s/p PCI to RCA Noncompliant to medications at this time. Continue with aspirin and statin.   Essential hypertension Blood pressure parameters appear to be optimal    Dysphagia Continue with dysphagia 2 diet.    Severe protein calorie malnutrition Dietary consulted and recommendations given.  Next  Body mass index is 20.07 kg/m.       DVT prophylaxis: (Lovenox Code Status: Full code family Communication: None at bedside disposition:   Status is: Inpatient  Remains inpatient appropriate because:Unsafe d/c plan   Dispo:  Patient From:  Other (Homeless)  Planned Disposition: Skilled Nursing Facility  Medically stable for discharge: No  Difficult to place patient: Yes      Consultants:   None  Procedures: None Antimicrobials: None  Subjective: No new complaints  Objective: Vitals:   11/28/20 2344 11/29/20 0421 11/29/20 0750 11/29/20 1607  BP: 109/73 130/79 116/68 103/66  Pulse: (!) 108 (!) 131 99 (!) 107  Resp: 19 20 17 19   Temp: 97.9 F (36.6 C) 98.3 F (36.8 C) 98 F (36.7 C) 98.3 F (36.8 C)  TempSrc: Axillary Axillary Oral Axillary  SpO2: 98% 96% 100% 93%  Weight:  51.4 kg    Height:        Intake/Output Summary (Last 24 hours) at 11/29/2020 1655 Last data filed at 11/29/2020 0424 Gross per 24 hour  Intake --  Output 350 ml  Net -350 ml   Filed Weights   11/27/20 0330 11/28/20 0436 11/29/20 0421  Weight: 49.2 kg 49.8 kg 51.4 kg    Examination:  General exam: Appears calm and comfortable  Respiratory system: Clear to auscultation. Respiratory effort normal. Cardiovascular system: S1 & S2 heard, RRR. No JVD, murmurs, rubs, gallops or clicks. No pedal edema. Gastrointestinal system: Abdomen is nondistended, soft and nontender.. Normal bowel sounds heard. Central nervous system: Alert , confused Extremities: Symmetric 5 x 5 power. Skin: No rashes, lesions or ulcers Psychiatry: Mood appropriate    Data Reviewed: I have personally reviewed following labs and imaging studies  CBC: Recent Labs  Lab 11/24/20 1839 11/24/20 1922 11/24/20 2158 11/25/20 0038 11/25/20 0136 11/27/20 0335 11/29/20 0215  WBC 12.5*  --  11.4* 10.4  --  10.3 11.3*  HGB 12.0*   < > 11.5* 11.4* 12.2* 11.1* 11.8*  HCT 32.7*   < > 31.0* 30.8* 36.0* 31.6* 34.9*  MCV 81.5  --  81.6 81.5  --  85.2 88.1  PLT 362  --  360 316  --  289 301   < > = values in this interval not displayed.    Basic Metabolic Panel: Recent Labs  Lab 11/25/20 0038 11/25/20 0136 11/25/20 0221 11/25/20 1808 11/25/20 2207  11/26/20 0515 11/26/20 1009 11/26/20 1839 11/27/20 0335 11/27/20 1416 11/28/20 0107 11/29/20 0215  NA 114* 113*   < > 121*   < > 121*   < > 125* 125* 128* 129* 132*  K 3.4* 3.6  --   --   --  3.4*  --   --  3.9  --  3.9 5.3*  CL 82*  --   --   --   --  91*  --   --  94*  --  99 101  CO2 20*  --   --   --   --  20*  --   --  23  --  22 17*  GLUCOSE 73  --   --   --   --  88  --   --  109*  --  125* 116*  BUN 6*  --   --   --   --  6*  --   --  6*  --  8 11  CREATININE 0.76  --   --   --   --  0.70  --   --  0.75  --  0.77 0.80  CALCIUM 7.8*  --   --   --   --  7.9*  --   --  8.2*  --  8.3* 8.6*  MG 1.4*  --   --  2.1  --  2.0  --   --  1.6*  --  1.7 1.9  PHOS 3.2  --   --   --   --   --   --   --   --   --   --  5.2*   < > = values in this interval not displayed.    GFR: Estimated Creatinine Clearance: 69.6 mL/min (by C-G formula based on SCr of 0.8 mg/dL).  Liver Function Tests: Recent Labs  Lab 11/24/20 1839 11/26/20 0515 11/27/20 0335 11/28/20 0107 11/29/20 0215  AST 88* 52* 41 31 39  ALT 62* 42 39 34 31  ALKPHOS 74 51 51 51 46  BILITOT 2.2* 0.9 0.8 0.7 1.2  PROT 6.0* 5.2* 5.6* 5.7* 6.0*  ALBUMIN 3.3* 2.7* 2.8* 2.5* 2.6*    CBG: Recent Labs  Lab 11/28/20 2315 11/29/20 0419 11/29/20 0744 11/29/20 1210 11/29/20 1610  GLUCAP 85 149* 118* 111* 107*     Recent Results (from the past 240 hour(s))  Blood culture (routine x 2)     Status: None   Collection Time: 11/24/20  7:00 PM   Specimen: BLOOD RIGHT HAND  Result Value Ref Range Status   Specimen Description BLOOD RIGHT HAND  Final   Special Requests   Final    BOTTLES DRAWN AEROBIC AND ANAEROBIC Blood Culture adequate volume   Culture   Final    NO GROWTH 5 DAYS Performed at [facility], [address] [place], [place] [address]    Report Status 11/29/2020 FINAL  Final  Blood culture (routine x  2)     Status: None   Collection Time: 11/24/20  7:10 PM   Specimen: BLOOD RIGHT ARM  Result Value  Ref Range Status   Specimen Description BLOOD RIGHT ARM  Final   Special Requests   Final    BOTTLES DRAWN AEROBIC AND ANAEROBIC Blood Culture adequate volume   Culture   Final    NO GROWTH 5 DAYS Performed at [facility], [address] [place], [place] [address]    Report Status 11/29/2020 FINAL  Final  Resp Panel by RT-PCR (Flu A&B, Covid) Nasopharyngeal Swab     Status: None   Collection Time: 11/24/20  7:37 PM   Specimen: Nasopharyngeal Swab; Nasopharyngeal(NP) swabs in vial transport medium  Result Value Ref Range Status   SARS Coronavirus 2 by RT PCR NEGATIVE NEGATIVE Final    Comment: (NOTE) SARS-CoV-2 target nucleic acids are NOT DETECTED.  The SARS-CoV-2 RNA is generally detectable in upper respiratory specimens during the acute phase of infection. The lowest concentration of SARS-CoV-2 viral copies this assay can detect is 138 copies/mL. A negative result does not preclude SARS-Cov-2 infection and should not be used as the sole basis for treatment or other patient management decisions. A negative result may occur with  improper specimen collection/handling, submission of specimen other than nasopharyngeal swab, presence of viral mutation(s) within the areas targeted by this assay, and inadequate number of viral copies(<138 copies/mL). A negative result must be combined with clinical observations, patient history, and epidemiological information. The expected result is Negative.  Fact Sheet for Patients:  BloggerCourse.com  Fact Sheet for Healthcare Providers:  SeriousBroker.it  This test is no t yet approved or cleared by the [place] FDA and  has been authorized for detection and/or diagnosis of SARS-CoV-2 by FDA under an Emergency Use Authorization (EUA). This EUA will remain  in effect (meaning this test can be used) for the duration of the COVID-19 declaration under Section 564(b)(1) of the Act,  21 U.S.C.section 360bbb-3(b)(1), unless the authorization is terminated  or revoked sooner.       Influenza A by PCR NEGATIVE NEGATIVE Final   Influenza B by PCR NEGATIVE NEGATIVE Final    Comment: (NOTE) The Xpert Xpress SARS-CoV-2/FLU/RSV plus assay is intended as an aid in the diagnosis of influenza from Nasopharyngeal swab specimens and should not be used as a sole basis for treatment. Nasal washings and aspirates are unacceptable for Xpert Xpress SARS-CoV-2/FLU/RSV testing.  Fact Sheet for Patients: BloggerCourse.com  Fact Sheet for Healthcare Providers: SeriousBroker.it  This test is not yet approved or cleared by the [place] FDA and has been authorized for detection and/or diagnosis of SARS-CoV-2 by FDA under an Emergency Use Authorization (EUA). This EUA will remain in effect (meaning this test can be used) for the duration of the COVID-19 declaration under Section 564(b)(1) of the Act, 21 U.S.C. section 360bbb-3(b)(1), unless the authorization is terminated or revoked.  Performed at [facility], [address], [place], [place] [address]   MRSA PCR Screening     Status: None   Collection Time: 11/25/20  6:59 AM   Specimen: Nasal Mucosa; Nasopharyngeal  Result Value Ref Range Status   MRSA by PCR NEGATIVE NEGATIVE Final    Comment:        The GeneXpert MRSA Assay (FDA approved for NASAL specimens only), is one component of a comprehensive MRSA colonization surveillance program. It is not intended to diagnose MRSA infection nor to guide or monitor treatment  for MRSA infections. Performed at [facility], [address] [place], [place] [address]          Radiology Studies: No results found.      Scheduled Meds: . enoxaparin (LOVENOX) injection  40 mg Subcutaneous QHS  . feeding supplement  237 mL Oral TID BM  . folic acid  1 mg Oral Daily  . insulin aspart  0-6 Units  Subcutaneous Q4H  . multivitamin with minerals  1 tablet Oral Daily  . nicotine  21 mg Transdermal Daily  . thiamine  100 mg Oral Daily   Continuous Infusions:   LOS: 5 days        Kathlen ModyVijaya Latona Krichbaum, MD [facility]   To contact the attending provider between 7A-7P or the covering provider during after hours 7P-7A, please log into the web site [email or web address] and access using universal [place] password for that web site. If you do not have the password, please call the hospital operator.  11/29/2020, 4:55 PM

## 2020-11-29 NOTE — Plan of Care (Signed)
?  Problem: Clinical Measurements: ?Goal: Will remain free from infection ?Outcome: Progressing ?  ?

## 2020-11-29 NOTE — Progress Notes (Signed)
  Speech Language Pathology Treatment: Dysphagia  Patient Details Name: Scott Berry MRN: [id number] DOB: Jul 08, 1960 Today's Date: 11/29/2020 Time: 2202-5427 SLP Time Calculation (min) (ACUTE ONLY): 24 min  Assessment / Plan / Recommendation Clinical Impression  Pt appears to be more alert than he was reported to be during his initial swallowing evaluation, but his mastication remains prolonged and his WOB labored. Oral clearance is complete, but per dietitian note, it has been taking a lot of time for pt to get through meals. A single cough was noted across intake, but no further s/s concerning for aspiration despite the above. Current diet likely remains most appropriate. Will continue Dys 2 (finely chopped) diet and thin liquids with ongoing f/u for potential to advance.    HPI HPI: Pt is a 60 year old male who presented with altered mental status and hyponatremia after a fall. Pt on CIWA protocol. CXR and CT head negative. PMHx significant for HTN, HLD, CAD (NSTEMI s/p PCI to proximal RCA 2015), OSA, polysubstance abuse, alcoholism colon perforation (s/p sigmoid colectomy/diverting ileostomy with subsequent takedown)      SLP Plan  Continue with current plan of care       Recommendations  Diet recommendations: Dysphagia 2 (fine chop);Thin liquid Liquids provided via: Cup;Straw Medication Administration: Crushed with puree Supervision: Staff to assist with self feeding;Full supervision/cueing for compensatory strategies Compensations: Slow rate;Small sips/bites;Minimize environmental distractions Postural Changes and/or Swallow Maneuvers: Seated upright 90 degrees                Oral Care Recommendations: Oral care BID Follow up Recommendations: 24 hour supervision/assistance SLP Visit Diagnosis: Dysphagia, unspecified (R13.10) Plan: Continue with current plan of care       GO                Mahala Menghini., M.A. CCC-SLP Acute Rehabilitation Services Pager  [phone] Office [phone]  11/29/2020, 12:01 PM

## 2020-11-29 NOTE — Progress Notes (Addendum)
Physical Therapy Treatment Patient Details Name: Scott Berry MRN: [id number] DOB: 03-Mar-1961 Today's Date: 11/29/2020    History of Present Illness Pt is a 60 y.o. male admitted 11/24/20 with AMS and fall. CXR and head CT negative. Workup for hyponatremia. PMH includes HTN, CAD, NSTEMI, OSA, colon perforation, polysubstance abuse, ETOH abuse.   PT Comments    Pt slowly progressing with mobility. Today's session focused on transfer training and ADL tasks. Pt limited by fatigue/lethargy and decreased attention, more alert once sitting EOB. Pt requires frequent, simple cues to attend to and perform task. Pt requires maxA+1 to stand pivot; demonstrates generalized weakness and impaired balance with bilateral knee instability and posterior lean; unable to initiate pre-gait activity this session. Continue to recommend SNF-level therapies to maximize functional mobility and independence. Will follow acutely to address established goals.    Follow Up Recommendations  SNF;Supervision for mobility/OOB     Equipment Recommendations   (TBD)    Recommendations for Other Services       Precautions / Restrictions Precautions Precautions: Fall Restrictions Weight Bearing Restrictions: No    Mobility  Bed Mobility Overal bed mobility: Needs Assistance Bed Mobility: Supine to Sit     Supine to sit: Max assist     General bed mobility comments: Pt assisting NT by using bedrail with rolling R/L for pericare and linen change; requiring minA to initiate movement to EOB, ultimately requiring maxA for trunk elevation and scooting hips to EOB, pt assisting with UE support on bed rail    Transfers Overall transfer level: Needs assistance Equipment used: None Transfers: Sit to/from [employer] Transfers Sit to Stand: Max assist Stand pivot transfers: Max assist       General transfer comment: Pt initiating stand with simple verbal cues, requiring maxA to achieve fully upright posture,  bilateral knee instability and posterior lean; difficulty weight shifting and unable to take steps, requiring maxA to pivot to recliner  Ambulation/Gait                 Stairs             Wheelchair Mobility    Modified Rankin (Stroke Patients Only)       Balance Overall balance assessment: Needs assistance Sitting-balance support: Feet supported;Bilateral upper extremity supported Sitting balance-Leahy Scale: Fair       Standing balance-Leahy Scale: Zero Standing balance comment: Reliant on maxA to briefly maintain static standing                            Cognition Arousal/Alertness: Lethargic;Awake/alert (more alert once sitting up) Behavior During Therapy: Flat affect Overall Cognitive Status: No family/caregiver present to determine baseline cognitive functioning Area of Impairment: Orientation;Attention;Following commands;Safety/judgement;Problem solving                   Current Attention Level: Sustained   Following Commands: Follows one step commands inconsistently;Follows one step commands with increased time Safety/Judgement: Decreased awareness of safety;Decreased awareness of deficits   Problem Solving: Slow processing;Decreased initiation;Difficulty sequencing;Requires tactile cues General Comments: Pt initiating >75% simple command following when first name stated first to ensure attention ("Sandy, wash your face... sit up... stand up, etc.); limited by decreased attention, apparent slowed processing. Very few verbalizations, able to state name and a couple sentences appropriately, but not consistent when asked      Exercises      General Comments General comments (skin integrity, edema, etc.): Session performed on  4-5L O2 [place], difficulty getting reliable pulse ox reading; HR 110s      Pertinent Vitals/Pain Pain Assessment: No/denies pain    Home Living                      Prior Function            PT  Goals (current goals can now be found in the care plan section) Acute Rehab PT Goals Patient Stated Goal: Did not state, seemed pleased to be sitting in recliner PT Goal Formulation: Patient unable to participate in goal setting Progress towards PT goals: Progressing toward goals (slowly)    Frequency    Min 2X/week      PT Plan Current plan remains appropriate    Co-evaluation              AM-PAC PT "6 Clicks" Mobility   Outcome Measure  Help needed turning from your back to your side while in a flat bed without using bedrails?: A Lot Help needed moving from lying on your back to sitting on the side of a flat bed without using bedrails?: A Lot Help needed moving to and from a bed to a chair (including a wheelchair)?: A Lot Help needed standing up from a chair using your arms (e.g., wheelchair or bedside chair)?: A Lot Help needed to walk in hospital room?: Total Help needed climbing 3-5 steps with a railing? : Total 6 Click Score: 10    End of Session   Activity Tolerance: Patient tolerated treatment well;Patient limited by fatigue;Patient limited by lethargy Patient left: in chair;with call bell/phone within reach;with chair alarm set Nurse Communication: Mobility status PT Visit Diagnosis: Other abnormalities of gait and mobility (R26.89);Muscle weakness (generalized) (M62.81);Adult, failure to thrive (R62.7)     Time: 8299-3716 PT Time Calculation (min) (ACUTE ONLY): 31 min  Charges:  $Therapeutic Activity: 23-37 mins                     Ina Homes, PT, DPT Acute Rehabilitation Services  Pager [phone] Office [phone]  Malachy Chamber 11/29/2020, 12:25 PM

## 2020-11-30 ENCOUNTER — Other Ambulatory Visit: Payer: Self-pay

## 2020-11-30 DIAGNOSIS — F1029 Alcohol dependence with unspecified alcohol-induced disorder: Secondary | ICD-10-CM | POA: Diagnosis not present

## 2020-11-30 LAB — COMPREHENSIVE METABOLIC PANEL
ALT: 35 U/L (ref 0–44)
AST: 54 U/L — ABNORMAL HIGH (ref 15–41)
Albumin: 2.4 g/dL — ABNORMAL LOW (ref 3.5–5.0)
Alkaline Phosphatase: 38 U/L (ref 38–126)
Anion gap: 7 (ref 5–15)
BUN: 12 mg/dL (ref 8–23)
CO2: 24 mmol/L (ref 22–32)
Calcium: 8.6 mg/dL — ABNORMAL LOW (ref 8.9–10.3)
Chloride: 100 mmol/L (ref 98–111)
Creatinine, Ser: 0.83 mg/dL (ref 0.61–1.24)
GFR, Estimated: >60 mL/min (ref >60)
Glucose, Bld: 100 mg/dL — ABNORMAL HIGH (ref 70–99)
Potassium: 3.5 mmol/L (ref 3.5–5.1)
Sodium: 131 mmol/L — ABNORMAL LOW (ref 135–145)
Total Bilirubin: 0.8 mg/dL (ref 0.3–1.2)
Total Protein: 5.9 g/dL — ABNORMAL LOW (ref 6.5–8.1)

## 2020-11-30 LAB — RESP PANEL BY RT-PCR (FLU A&B, COVID) ARPGX2
Influenza A by PCR: NEGATIVE
Influenza B by PCR: NEGATIVE
SARS Coronavirus 2 by RT PCR: NEGATIVE

## 2020-11-30 LAB — GLUCOSE, CAPILLARY
Glucose-Capillary: 120 mg/dL — ABNORMAL HIGH (ref 70–99)
Glucose-Capillary: 126 mg/dL — ABNORMAL HIGH (ref 70–99)
Glucose-Capillary: 132 mg/dL — ABNORMAL HIGH (ref 70–99)
Glucose-Capillary: 141 mg/dL — ABNORMAL HIGH (ref 70–99)
Glucose-Capillary: 86 mg/dL (ref 70–99)
Glucose-Capillary: 96 mg/dL (ref 70–99)

## 2020-11-30 LAB — MAGNESIUM: Magnesium: 1.8 mg/dL (ref 1.7–2.4)

## 2020-11-30 MED ORDER — OLANZAPINE 5 MG PO TBDP: 2.5000 mg | ORAL_TABLET | Freq: Four times a day (QID) | ORAL | Status: AC | PRN

## 2020-11-30 MED ORDER — THIAMINE HCL 100 MG PO TABS: 100.0000 mg | ORAL_TABLET | Freq: Every day | ORAL | Status: AC

## 2020-11-30 MED ORDER — ENSURE ENLIVE PO LIQD
237.0000 mL | Freq: Three times a day (TID) | ORAL | 12 refills | Status: AC
  Filled 2020-11-30: qty 237, 1d supply, fill #0

## 2020-11-30 MED ORDER — FOLIC ACID 1 MG PO TABS
1.0000 mg | ORAL_TABLET | Freq: Every day | ORAL | 1 refills | Status: AC
  Filled 2020-11-30: qty 30, 30d supply, fill #0

## 2020-11-30 MED ORDER — NICOTINE 21 MG/24HR TD PT24
21.0000 mg | MEDICATED_PATCH | Freq: Every day | TRANSDERMAL | 0 refills | Status: AC
  Filled 2020-11-30: qty 28, 28d supply, fill #0

## 2020-11-30 MED ORDER — ADULT MULTIVITAMIN W/MINERALS CH: 1.0000 | ORAL_TABLET | Freq: Every day | ORAL | Status: AC

## 2020-11-30 MED ORDER — DOCUSATE SODIUM 100 MG PO CAPS
100.0000 mg | ORAL_CAPSULE | Freq: Two times a day (BID) | ORAL | 0 refills | Status: AC | PRN
  Filled 2020-11-30: qty 10, 5d supply, fill #0

## 2020-11-30 MED ORDER — POLYETHYLENE GLYCOL 3350 17 G PO PACK
17.0000 g | PACK | Freq: Every day | ORAL | 0 refills | Status: AC | PRN
  Filled 2020-11-30: qty 14, 14d supply, fill #0

## 2020-11-30 NOTE — Discharge Instructions (Signed)
Hyponatremia Hyponatremia is when the amount of salt (sodium) in your blood is too low. When salt levels are low, your body may take in extra water. This can cause swelling throughout the body. The swelling often affects the brain. What are the causes? This condition may be caused by:  Certain medical problems or conditions.  Vomiting a lot.  Having watery poop (diarrhea) often.  Certain medicines or illegal drugs.  Not having enough water in the body (dehydration).  Drinking too much water.  Eating a diet that is low in salt.  Large burns on your body.  Too much sweating. What increases the risk? You are more likely to get this condition if you:  Have long-term (chronic) kidney disease.  Have heart failure.  Have a medical condition that causes you to have watery poop often.  Do very hard exercises.  Take medicines that affect the amount of salt is in your blood. What are the signs or symptoms? Symptoms of this condition include:  Headache.  Feeling like you may vomit (nausea).  Vomiting.  Being very tired (lethargic).  Muscle weakness and cramps.  Not wanting to eat as much as normal (loss of appetite).  Feeling weak or light-headed. Severe symptoms of this condition include:  Confusion.  Feeling restless (agitation).  Having a fast heart rate.  Passing out (fainting).  Seizures.  Coma. How is this treated? Treatment for this condition depends on the cause. Treatment may include:  Getting fluids through an IV tube that is put into one of your veins.  Taking medicines to fix the salt levels in your blood. If medicines are causing the problem, your medicines will need to be changed.  Limiting how much water or fluid you take in.  Monitoring in the hospital to watch your symptoms.   Follow these instructions at home:  Take over-the-counter and prescription medicines only as told by your doctor. Many medicines can make this condition worse.  Talk with your doctor about any medicines that you are taking.  Eat and drink exactly as you are told by your doctor. ? Eat only the foods you are told to eat. ? Limit how much fluid you take.  Do not drink alcohol.  Keep all follow-up visits as told by your doctor. This is important.   Contact a doctor if:  You feel more like you may vomit.  You feel more tired.  Your headache gets worse.  You feel more confused.  You feel weaker.  Your symptoms go away and then they come back.  You have trouble following the diet instructions. Get help right away if:  You have a seizure.  You pass out.  You keep having watery poop.  You keep vomiting. Summary  Hyponatremia is when the amount of salt in your blood is too low.  When salt levels are low, you can have swelling throughout the body. The swelling mostly affects the brain.  Treatment depends on the cause. Treatment may include getting IV fluids, medicines, or not drinking as much fluid. This information is not intended to replace advice given to you by your health care provider. Make sure you discuss any questions you have with your health care provider. Document Revised: 09/04/2018 Document Reviewed: 05/22/2018 Elsevier Patient Education  2021 [employer]  

## 2020-11-30 NOTE — TOC Transition Note (Addendum)
Transition of Care [facility]) - CM/SW Discharge Note   Patient Details  Name: Scott Berry MRN: [id number] Date of Birth: [birth year]  [facility] CM/SW Contact:  Terrial Rhodes, LCSWA Phone Number: 11/30/2020, 2:37 PM   Clinical Narrative:     Patient will DC to: [facility]   Anticipated DC date: 11/30/2020  Family notified: LVM for [facility]   Transport by: Sharin Mons  ?  Per MD patient ready for DC to [facility] . RN, patient, patient's family, and facility notified of DC. Discharge Summary sent to facility. RN given number for report tele#[phone] RM#[phone]. DC packet on chart. Ambulance transport requested for patient.  CSW signing off.    Final next level of care: Skilled Nursing Facility Barriers to Discharge: No Barriers Identified   Patient Goals and CMS Choice   CMS Medicare.gov Compare Post Acute Care list provided to:: Patient Represenative (must comment) (Angelia) Choice offered to / list presented to : NA [name])  Discharge Placement              Patient chooses bed at: [facility] Patient to be transferred to facility by: [facility] Name of family member notified: LVM with [facility] Patient and family notified of of transfer: 11/30/20  Discharge Plan and Services In-house Referral: Clinical Social Work                                   Social Determinants of Health (SDOH) Interventions     Readmission Risk Interventions No flowsheet data found.

## 2020-11-30 NOTE — Discharge Summary (Signed)
Physician Discharge Summary  Seymore Brodowski Maffia JYN:[id number] DOB: 03-Nov-1960 DOA: 11/24/2020  PCP: Adrienne Mocha, PA  Admit date: 11/24/2020 Discharge date: 11/30/2020  Admitted From:[facility].  Disposition: SNF  Recommendations for Outpatient Follow-up:  1. Follow up with PCP in 1-2 weeks 2. Please obtain BMP/CBC in one week   Discharge Condition: guarded.  CODE STATUS:FULL CODE.  Diet recommendation: Heart Healthy   Brief/Interim Summary:  60 year old gentleman prior history of hypertension, coronary artery disease s/p PCI to proximal RCA, polysubstance abuse, s/p sigmoid colectomy/diverting ileostomy presents to ED for altered mental status.  Patient was initially admitted to [facility] service secondary to severe hyponatremia requiring hypertonic saline.  His initial sodium was 106 and it has improved to 131 today.  Discharge Diagnoses:  Principal Problem:   Hyponatremia Active Problems:   Hypertension   Polysubstance abuse in past   Tobacco abuse   Dyslipidemia, goal LDL below 70   EtOH dependence (HCC)   Pressure injury of skin   Protein-calorie malnutrition, severe    Acute metabolic encephalopathy present on admission possibly secondary to severe hyponatremia secondary to chronic EtOH abuse CT of the head does not show any intracranial abnormality.  He was initially admitted by [facility] service and was treated with hypertonic saline with improvement in sodium levels. Sodium has improved to 131.     Hypokalemia and hypomagnesemia have been replaced    EtOH dependence EtOH for substance abuse continue with folic acid thiamine and multivitamin      History ofs coronary artery disease s/p PCI to RCA Noncompliant to medications at this time. Continue with aspirin and statin.   Essential hypertension Blood pressure parameters appear to be optimal    Dysphagia Continue with dysphagia 2 diet.    Severe protein calorie malnutrition Dietary consulted  and recommendations given.  Next    Pressure injury present on admission.  Pressure Injury 11/27/20 Buttocks Right Stage 2 -  Partial thickness loss of dermis presenting as a shallow open injury with a red, pink wound bed without slough. (Active)  11/27/20 (formerly charted as a wound and described as "scabbed pressure ulcer" on 5/26) 1850  Location: Buttocks  Location Orientation: Right  Staging: Stage 2 -  Partial thickness loss of dermis presenting as a shallow open injury with a red, pink wound bed without slough.  Wound Description (Comments):   Present on Admission: Yes   Wound care will be consulted.     Discharge Instructions  Discharge Instructions    Diet - low sodium heart healthy   Complete by: As directed    Discharge wound care:   Complete by: As directed    Local wound care   Increase activity slowly   Complete by: As directed      Allergies as of 11/30/2020      Reactions   Penicillins Anaphylaxis   Has patient had a PCN reaction causing immediate rash, facial/tongue/throat swelling, SOB or lightheadedness with hypotension: Yes Has patient had a PCN reaction causing severe rash involving mucus membranes or skin necrosis:No Has patient had a PCN reaction that required hospitalization No Has patient had a PCN reaction occurring within the last 10 years: No If all of the above answers are "NO", then may proceed with Cephalosporin use.      Medication List    STOP taking these medications   lisinopril-hydrochlorothiazide 20-12.5 MG tablet Commonly known as: ZESTORETIC     TAKE these medications   aspirin EC 81 MG tablet Take 1 tablet (81  mg total) by mouth daily. Swallow whole.   atorvastatin 40 MG tablet Commonly known as: LIPITOR Take 1 tablet (40 mg total) by mouth daily.   docusate sodium 100 MG capsule Commonly known as: COLACE Take 1 capsule (100 mg total) by mouth 2 (two) times daily as needed for mild constipation.   feeding supplement  Liqd Take 237 mLs by mouth 3 (three) times daily between meals.   folic acid 1 MG tablet Commonly known as: FOLVITE Take 1 tablet (1 mg total) by mouth daily. Start taking on: December 01, 2020   multivitamin with minerals Tabs tablet Take 1 tablet by mouth daily. Start taking on: December 01, 2020   nicotine 21 mg/24hr patch Commonly known as: NICODERM CQ - dosed in mg/24 hours Place 1 patch (21 mg total) onto the skin daily. Start taking on: December 01, 2020   OLANZapine zydis 5 MG disintegrating tablet Commonly known as: ZYPREXA Take 0.5 tablets (2.5 mg total) by mouth every 6 (six) hours as needed (Agitation and restlessness).   polyethylene glycol 17 g packet Commonly known as: MIRALAX / GLYCOLAX Take 17 g by mouth daily as needed for moderate constipation.   thiamine 100 MG tablet Take 1 tablet (100 mg total) by mouth daily. Start taking on: December 01, 2020            Discharge Care Instructions  (From admission, onward)         Start     Ordered   11/30/20 0000  Discharge wound care:       Comments: Local wound care   11/30/20 1408          Allergies  Allergen Reactions  . Penicillins Anaphylaxis    Has patient had a PCN reaction causing immediate rash, facial/tongue/throat swelling, SOB or lightheadedness with hypotension: Yes Has patient had a PCN reaction causing severe rash involving mucus membranes or skin necrosis:No Has patient had a PCN reaction that required hospitalization No Has patient had a PCN reaction occurring within the last 10 years: No If all of the above answers are "NO", then may proceed with Cephalosporin use.     Consultations:  PCCM     Procedures/Studies: CT Head Wo Contrast  Result Date: 11/24/2020 CLINICAL DATA:  Mental status change EXAM: CT HEAD WITHOUT CONTRAST TECHNIQUE: Contiguous axial images were obtained from the base of the skull through the vertex without intravenous contrast. COMPARISON:  CT brain 03/09/2015 FINDINGS:  Brain: No acute territorial infarction, hemorrhage, or intracranial mass. Mild atrophy. Stable ventricle size. Vascular: No hyperdense vessels.  Carotid vascular calcification Skull: No fracture Sinuses/Orbits: No acute finding. Other: Small left forehead scalp swelling IMPRESSION: 1. No CT evidence for acute intracranial abnormality. 2. Mild atrophy Electronically Signed   By: Jasmine Pang M.D.   On: 11/24/2020 19:23   DG Chest Port 1 View  Result Date: 11/24/2020 CLINICAL DATA:  60 year old male with altered mental status. EXAM: PORTABLE CHEST 1 VIEW COMPARISON:  Chest radiograph dated 04/01/2015 FINDINGS: No focal consolidation, pleural effusion, or pneumothorax. The cardiac silhouette is within limits. No acute osseous pathology. Atherosclerotic calcification of the aorta. IMPRESSION: No active disease. Electronically Signed   By: Elgie Collard M.D.   On: 11/24/2020 19:08       Subjective:  No new complaints.  Discharge Exam: Vitals:   11/30/20 0753 11/30/20 1200  BP: 116/88 132/83  Pulse: (!) 110 (!) 109  Resp: 18   Temp: 98 F (36.7 C)   SpO2: 100%  Vitals:   11/30/20 0100 11/30/20 0500 11/30/20 0753 11/30/20 1200  BP:  116/64 116/88 132/83  Pulse:  (!) 109 (!) 110 (!) 109  Resp:  20 18   Temp:  98.4 F (36.9 C) 98 F (36.7 C)   TempSrc:  Axillary Axillary   SpO2:  98% 100%   Weight: 49.8 kg     Height:        General: Pt is alert, awake, not in acute distress Cardiovascular: RRR, S1/S2 +, no rubs, no gallops Respiratory: CTA bilaterally, no wheezing, no rhonchi Abdominal: Soft, NT, ND, bowel sounds + Extremities: no edema, no cyanosis    The results of significant diagnostics from this hospitalization (including imaging, microbiology, ancillary and laboratory) are listed below for reference.     Microbiology: Recent Results (from the past 240 hour(s))  Blood culture (routine x 2)     Status: None   Collection Time: 11/24/20  7:00 PM   Specimen: BLOOD  RIGHT HAND  Result Value Ref Range Status   Specimen Description BLOOD RIGHT HAND  Final   Special Requests   Final    BOTTLES DRAWN AEROBIC AND ANAEROBIC Blood Culture adequate volume   Culture   Final    NO GROWTH 5 DAYS Performed at [facility], [address] [place], [place] [address]    Report Status 11/29/2020 FINAL  Final  Blood culture (routine x 2)     Status: None   Collection Time: 11/24/20  7:10 PM   Specimen: BLOOD RIGHT ARM  Result Value Ref Range Status   Specimen Description BLOOD RIGHT ARM  Final   Special Requests   Final    BOTTLES DRAWN AEROBIC AND ANAEROBIC Blood Culture adequate volume   Culture   Final    NO GROWTH 5 DAYS Performed at [facility], [address] [place], [place] [address]    Report Status 11/29/2020 FINAL  Final  Resp Panel by RT-PCR (Flu A&B, Covid) Nasopharyngeal Swab     Status: None   Collection Time: 11/24/20  7:37 PM   Specimen: Nasopharyngeal Swab; Nasopharyngeal(NP) swabs in vial transport medium  Result Value Ref Range Status   SARS Coronavirus 2 by RT PCR NEGATIVE NEGATIVE Final    Comment: (NOTE) SARS-CoV-2 target nucleic acids are NOT DETECTED.  The SARS-CoV-2 RNA is generally detectable in upper respiratory specimens during the acute phase of infection. The lowest concentration of SARS-CoV-2 viral copies this assay can detect is 138 copies/mL. A negative result does not preclude SARS-Cov-2 infection and should not be used as the sole basis for treatment or other patient management decisions. A negative result may occur with  improper specimen collection/handling, submission of specimen other than nasopharyngeal swab, presence of viral mutation(s) within the areas targeted by this assay, and inadequate number of viral copies(<138 copies/mL). A negative result must be combined with clinical observations, patient history, and epidemiological information. The expected result is Negative.  Fact Sheet for  Patients:  BloggerCourse.comhttps://www.fda.gov/media/152166/download  Fact Sheet for Healthcare Providers:  SeriousBroker.ithttps://www.fda.gov/media/152162/download  This test is no t yet approved or cleared by the [place] FDA and  has been authorized for detection and/or diagnosis of SARS-CoV-2 by FDA under an Emergency Use Authorization (EUA). This EUA will remain  in effect (meaning this test can be used) for the duration of the COVID-19 declaration under Section 564(b)(1) of the Act, 21 U.S.C.section 360bbb-3(b)(1), unless the authorization is terminated  or revoked sooner.  Influenza A by PCR NEGATIVE NEGATIVE Final   Influenza B by PCR NEGATIVE NEGATIVE Final    Comment: (NOTE) The Xpert Xpress SARS-CoV-2/FLU/RSV plus assay is intended as an aid in the diagnosis of influenza from Nasopharyngeal swab specimens and should not be used as a sole basis for treatment. Nasal washings and aspirates are unacceptable for Xpert Xpress SARS-CoV-2/FLU/RSV testing.  Fact Sheet for Patients: BloggerCourse.com  Fact Sheet for Healthcare Providers: SeriousBroker.it  This test is not yet approved or cleared by the [place] FDA and has been authorized for detection and/or diagnosis of SARS-CoV-2 by FDA under an Emergency Use Authorization (EUA). This EUA will remain in effect (meaning this test can be used) for the duration of the COVID-19 declaration under Section 564(b)(1) of the Act, 21 U.S.C. section 360bbb-3(b)(1), unless the authorization is terminated or revoked.  Performed at [facility], [address] [place], [place] [address]   MRSA PCR Screening     Status: None   Collection Time: 11/25/20  6:59 AM   Specimen: Nasal Mucosa; Nasopharyngeal  Result Value Ref Range Status   MRSA by PCR NEGATIVE NEGATIVE Final    Comment:        The GeneXpert MRSA Assay (FDA approved for NASAL specimens only), is one component of a comprehensive  MRSA colonization surveillance program. It is not intended to diagnose MRSA infection nor to guide or monitor treatment for MRSA infections. Performed at [facility], [address] [place], [place] [address]      Labs: BNP (last 3 results) No results for input(s): BNP in the last 8760 hours. Basic Metabolic Panel: Recent Labs  Lab 11/25/20 0038 11/25/20 0136 11/26/20 0515 11/26/20 1009 11/27/20 0335 11/27/20 1416 11/28/20 0107 11/29/20 0215 11/30/20 0839  NA 114*   < > 121*   < > 125* 128* 129* 132* 131*  K 3.4*   < > 3.4*  --  3.9  --  3.9 5.3* 3.5  CL 82*  --  91*  --  94*  --  99 101 100  CO2 20*  --  20*  --  23  --  22 17* 24  GLUCOSE 73  --  88  --  109*  --  125* 116* 100*  BUN 6*  --  6*  --  6*  --  8 11 12   CREATININE 0.76  --  0.70  --  0.75  --  0.77 0.80 0.83  CALCIUM 7.8*  --  7.9*  --  8.2*  --  8.3* 8.6* 8.6*  MG 1.4*   < > 2.0  --  1.6*  --  1.7 1.9 1.8  PHOS 3.2  --   --   --   --   --   --  5.2*  --    < > = values in this interval not displayed.   Liver Function Tests: Recent Labs  Lab 11/26/20 0515 11/27/20 0335 11/28/20 0107 11/29/20 0215 11/30/20 0839  AST 52* 41 31 39 54*  ALT 42 39 34 31 35  ALKPHOS 51 51 51 46 38  BILITOT 0.9 0.8 0.7 1.2 0.8  PROT 5.2* 5.6* 5.7* 6.0* 5.9*  ALBUMIN 2.7* 2.8* 2.5* 2.6* 2.4*   No results for input(s): LIPASE, AMYLASE in the last 168 hours. No results for input(s): AMMONIA in the last 168 hours. CBC: Recent Labs  Lab 11/24/20 1839 11/24/20 1922 11/24/20 2158 11/25/20 0038 11/25/20 0136 11/27/20 0335 11/29/20 0215  WBC  12.5*  --  11.4* 10.4  --  10.3 11.3*  HGB 12.0*   < > 11.5* 11.4* 12.2* 11.1* 11.8*  HCT 32.7*   < > 31.0* 30.8* 36.0* 31.6* 34.9*  MCV 81.5  --  81.6 81.5  --  85.2 88.1  PLT 362  --  360 316  --  289 301   < > = values in this interval not displayed.   Cardiac Enzymes: Recent Labs  Lab 11/24/20 2158  CKTOTAL 664*   BNP: Invalid input(s): POCBNP CBG: Recent  Labs  Lab 11/29/20 2015 11/30/20 0019 11/30/20 0504 11/30/20 0748 11/30/20 1206  GLUCAP 147* 141* 96 86 132*   D-Dimer No results for input(s): DDIMER in the last 72 hours. Hgb A1c No results for input(s): HGBA1C in the last 72 hours. Lipid Profile No results for input(s): CHOL, HDL, LDLCALC, TRIG, CHOLHDL, LDLDIRECT in the last 72 hours. Thyroid function studies No results for input(s): TSH, T4TOTAL, T3FREE, THYROIDAB in the last 72 hours.  Invalid input(s): FREET3 Anemia work up No results for input(s): VITAMINB12, FOLATE, FERRITIN, TIBC, IRON, RETICCTPCT in the last 72 hours. Urinalysis    Component Value Date/Time   COLORURINE YELLOW 11/24/2020 2340   APPEARANCEUR HAZY (A) 11/24/2020 2340   LABSPEC 1.004 (L) 11/24/2020 2340   PHURINE 7.0 11/24/2020 2340   GLUCOSEU 150 (A) 11/24/2020 2340   HGBUR SMALL (A) 11/24/2020 2340   BILIRUBINUR NEGATIVE 11/24/2020 2340   KETONESUR 20 (A) 11/24/2020 2340   PROTEINUR NEGATIVE 11/24/2020 2340   UROBILINOGEN 0.2 04/01/2015 0509   NITRITE POSITIVE (A) 11/24/2020 2340   LEUKOCYTESUR LARGE (A) 11/24/2020 2340   Sepsis Labs Invalid input(s): PROCALCITONIN,  WBC,  LACTICIDVEN Microbiology Recent Results (from the past 240 hour(s))  Blood culture (routine x 2)     Status: None   Collection Time: 11/24/20  7:00 PM   Specimen: BLOOD RIGHT HAND  Result Value Ref Range Status   Specimen Description BLOOD RIGHT HAND  Final   Special Requests   Final    BOTTLES DRAWN AEROBIC AND ANAEROBIC Blood Culture adequate volume   Culture   Final    NO GROWTH 5 DAYS Performed at [facility], [address], [place], [place] [address]    Report Status 11/29/2020 FINAL  Final  Blood culture (routine x 2)     Status: None   Collection Time: 11/24/20  7:10 PM   Specimen: BLOOD RIGHT ARM  Result Value Ref Range Status   Specimen Description BLOOD RIGHT ARM  Final   Special Requests   Final    BOTTLES DRAWN AEROBIC AND ANAEROBIC Blood  Culture adequate volume   Culture   Final    NO GROWTH 5 DAYS Performed at [facility], [address], [place], [place] [address]    Report Status 11/29/2020 FINAL  Final  Resp Panel by RT-PCR (Flu A&B, Covid) Nasopharyngeal Swab     Status: None   Collection Time: 11/24/20  7:37 PM   Specimen: Nasopharyngeal Swab; Nasopharyngeal(NP) swabs in vial transport medium  Result Value Ref Range Status   SARS Coronavirus 2 by RT PCR NEGATIVE NEGATIVE Final    Comment: (NOTE) SARS-CoV-2 target nucleic acids are NOT DETECTED.  The SARS-CoV-2 RNA is generally detectable in upper respiratory specimens during the acute phase of infection. The lowest concentration of SARS-CoV-2 viral copies this assay can detect is 138 copies/mL. A negative result does not preclude SARS-Cov-2 infection and should not be used as the  sole basis for treatment or other patient management decisions. A negative result may occur with  improper specimen collection/handling, submission of specimen other than nasopharyngeal swab, presence of viral mutation(s) within the areas targeted by this assay, and inadequate number of viral copies(<138 copies/mL). A negative result must be combined with clinical observations, patient history, and epidemiological information. The expected result is Negative.  Fact Sheet for Patients:  BloggerCourse.com  Fact Sheet for Healthcare Providers:  SeriousBroker.it  This test is no t yet approved or cleared by the [place] FDA and  has been authorized for detection and/or diagnosis of SARS-CoV-2 by FDA under an Emergency Use Authorization (EUA). This EUA will remain  in effect (meaning this test can be used) for the duration of the COVID-19 declaration under Section 564(b)(1) of the Act, 21 U.S.C.section 360bbb-3(b)(1), unless the authorization is terminated  or revoked sooner.       Influenza A by PCR NEGATIVE NEGATIVE  Final   Influenza B by PCR NEGATIVE NEGATIVE Final    Comment: (NOTE) The Xpert Xpress SARS-CoV-2/FLU/RSV plus assay is intended as an aid in the diagnosis of influenza from Nasopharyngeal swab specimens and should not be used as a sole basis for treatment. Nasal washings and aspirates are unacceptable for Xpert Xpress SARS-CoV-2/FLU/RSV testing.  Fact Sheet for Patients: BloggerCourse.com  Fact Sheet for Healthcare Providers: SeriousBroker.it  This test is not yet approved or cleared by the [place] FDA and has been authorized for detection and/or diagnosis of SARS-CoV-2 by FDA under an Emergency Use Authorization (EUA). This EUA will remain in effect (meaning this test can be used) for the duration of the COVID-19 declaration under Section 564(b)(1) of the Act, 21 U.S.C. section 360bbb-3(b)(1), unless the authorization is terminated or revoked.  Performed at [facility], [address] [place], [place] [address]   MRSA PCR Screening     Status: None   Collection Time: 11/25/20  6:59 AM   Specimen: Nasal Mucosa; Nasopharyngeal  Result Value Ref Range Status   MRSA by PCR NEGATIVE NEGATIVE Final    Comment:        The GeneXpert MRSA Assay (FDA approved for NASAL specimens only), is one component of a comprehensive MRSA colonization surveillance program. It is not intended to diagnose MRSA infection nor to guide or monitor treatment for MRSA infections. Performed at [facility], [address] [place], [place] [address]      Time coordinating discharge: 33 minutes.   SIGNED:   Kathlen Mody, MD  [facility] 11/30/2020, 2:08 PM

## 2020-11-30 NOTE — Consult Note (Signed)
WOC Nurse Consult Note: Reason for Consult: Consult requested for back wound.  Assessed back and buttocks.  There is no wound visible to the back.  Buttocks with scattered partial thickness abrasions from previous fall noted prior to admission.  Affected area approx 4X4X.1cm, red dry scabs Dressing procedure/placement/frequency: Topical treatment orders provided for bedside nurses to perform to promote healing as follows: Barrier cream to buttocks PRN when turning or cleaning. Please re-consult if further assistance is needed.  Thank-you,  Cammie Mcgee MSN, RN, CWOCN, [place], CNS [phone]

## 2020-11-30 NOTE — TOC Progression Note (Addendum)
Transition of Care [facility]) - Progression Note    Patient Details  Name: Scott Berry MRN: [id number] Date of Birth: Jul 27, 1960  Transition of Care [facility] CM/SW Contact  Terrial Rhodes, [place] Phone Number: 11/30/2020, 12:12 PM  Clinical Narrative:     Nicanor Alcon Covid not needed to transition to SNF.  Update-DTP team found patient SNF bed at [facility]. Facility started insurance authorization for patient. Per Mariella Saa with [facility] authorization is not required for admission.CSW requested covid for patient. CSW will continue to follow and assist with discharge planning needs.    No Current SNF bed offers. Barriers to placement. TOC leadership informed. CSW will continue to follow and assist with discharge planning needs.   Expected Discharge Plan: Skilled Nursing Facility Barriers to Discharge: Continued Medical Work up  Expected Discharge Plan and Services Expected Discharge Plan: Skilled Nursing Facility In-house Referral: Clinical Social Work     Living arrangements for the past 2 months: Single Family Home                                       Social Determinants of Health (SDOH) Interventions    Readmission Risk Interventions No flowsheet data found.

## 2020-12-01 NOTE — Progress Notes (Signed)
Pt discharged, no longer available in pyxis to waste Ativan. 1.5mg /1.75 mL wasted with Doristine Church, RN.

## 2020-12-30 DEATH — deceased

## 2021-03-27 ENCOUNTER — Other Ambulatory Visit: Payer: Self-pay
